# Patient Record
Sex: Male | Born: 1945 | Race: White | Hispanic: No | Marital: Married | Smoking: Never smoker
Health system: Southern US, Community
[De-identification: ages and names within clinical notes are randomized; demographics above are authoritative.]

## PROBLEM LIST (undated history)

## (undated) DIAGNOSIS — Z9889 Other specified postprocedural states: Secondary | ICD-10-CM

## (undated) DIAGNOSIS — Z8669 Personal history of other diseases of the nervous system and sense organs: Secondary | ICD-10-CM

## (undated) DIAGNOSIS — Z87442 Personal history of urinary calculi: Secondary | ICD-10-CM

## (undated) DIAGNOSIS — L719 Rosacea, unspecified: Secondary | ICD-10-CM

## (undated) DIAGNOSIS — E0590 Thyrotoxicosis, unspecified without thyrotoxic crisis or storm: Secondary | ICD-10-CM

## (undated) DIAGNOSIS — K219 Gastro-esophageal reflux disease without esophagitis: Secondary | ICD-10-CM

## (undated) DIAGNOSIS — I4891 Unspecified atrial fibrillation: Secondary | ICD-10-CM

## (undated) DIAGNOSIS — R112 Nausea with vomiting, unspecified: Secondary | ICD-10-CM

## (undated) HISTORY — DX: Unspecified atrial fibrillation: I48.91

## (undated) HISTORY — PX: VASECTOMY REVERSAL: SHX243

## (undated) HISTORY — PX: KNEE SURGERY: SHX244

## (undated) HISTORY — PX: CARDIOVERSION: SHX1299

## (undated) HISTORY — PX: OTHER SURGICAL HISTORY: SHX169

## (undated) HISTORY — PX: ELBOW SURGERY: SHX618

---

## 1999-04-22 ENCOUNTER — Emergency Department (HOSPITAL_COMMUNITY): Admission: EM | Admit: 1999-04-22 | Discharge: 1999-04-22 | Payer: Self-pay | Admitting: Emergency Medicine

## 1999-08-02 ENCOUNTER — Ambulatory Visit (HOSPITAL_COMMUNITY): Admission: RE | Admit: 1999-08-02 | Discharge: 1999-08-02 | Payer: Self-pay | Admitting: Gastroenterology

## 1999-10-16 ENCOUNTER — Encounter: Payer: Self-pay | Admitting: Specialist

## 1999-10-16 ENCOUNTER — Ambulatory Visit (HOSPITAL_COMMUNITY): Admission: RE | Admit: 1999-10-16 | Discharge: 1999-10-16 | Payer: Self-pay | Admitting: Specialist

## 2005-04-29 ENCOUNTER — Ambulatory Visit (HOSPITAL_COMMUNITY): Admission: RE | Admit: 2005-04-29 | Discharge: 2005-04-29 | Payer: Self-pay | Admitting: Gastroenterology

## 2006-03-11 ENCOUNTER — Ambulatory Visit: Payer: Self-pay | Admitting: Cardiology

## 2006-04-22 ENCOUNTER — Ambulatory Visit: Payer: Self-pay | Admitting: Cardiology

## 2006-06-02 ENCOUNTER — Ambulatory Visit: Payer: Self-pay | Admitting: Cardiology

## 2006-07-20 ENCOUNTER — Emergency Department (HOSPITAL_COMMUNITY): Admission: EM | Admit: 2006-07-20 | Discharge: 2006-07-20 | Payer: Self-pay | Admitting: Emergency Medicine

## 2006-10-06 ENCOUNTER — Ambulatory Visit: Payer: Self-pay | Admitting: Cardiology

## 2006-12-05 ENCOUNTER — Ambulatory Visit: Payer: Self-pay | Admitting: Cardiology

## 2006-12-05 LAB — CONVERTED CEMR LAB
ALT: 25 units/L (ref 0–40)
AST: 20 units/L (ref 0–37)
Albumin: 3.8 g/dL (ref 3.5–5.2)
Alkaline Phosphatase: 57 units/L (ref 39–117)
Bilirubin, Direct: 0.2 mg/dL (ref 0.0–0.3)
Cholesterol: 154 mg/dL (ref 0–200)
HDL: 33.3 mg/dL — ABNORMAL LOW (ref >39.0)
LDL Cholesterol: 109 mg/dL — ABNORMAL HIGH (ref 0–99)
Total Bilirubin: 1 mg/dL (ref 0.3–1.2)
Total CHOL/HDL Ratio: 4.6
Total Protein: 6.5 g/dL (ref 6.0–8.3)
Triglycerides: 57 mg/dL (ref 0–149)
VLDL: 11 mg/dL (ref 0–40)

## 2007-04-09 ENCOUNTER — Ambulatory Visit: Payer: Self-pay | Admitting: Cardiology

## 2007-04-09 LAB — CONVERTED CEMR LAB
Free T4: 0.9 ng/dL (ref 0.6–1.6)
TSH: 0.1 microintl units/mL — ABNORMAL LOW (ref 0.35–5.50)

## 2007-04-13 ENCOUNTER — Ambulatory Visit: Payer: Self-pay | Admitting: Cardiology

## 2007-04-23 ENCOUNTER — Ambulatory Visit: Payer: Self-pay | Admitting: Internal Medicine

## 2007-04-23 LAB — CONVERTED CEMR LAB: TSH: 0.18 microintl units/mL — ABNORMAL LOW (ref 0.35–5.50)

## 2007-04-28 ENCOUNTER — Ambulatory Visit: Payer: Self-pay | Admitting: Cardiology

## 2007-05-05 ENCOUNTER — Ambulatory Visit: Payer: Self-pay | Admitting: Cardiology

## 2007-05-12 ENCOUNTER — Ambulatory Visit: Payer: Self-pay | Admitting: Cardiology

## 2007-05-18 ENCOUNTER — Ambulatory Visit: Payer: Self-pay | Admitting: Cardiology

## 2007-05-28 ENCOUNTER — Ambulatory Visit: Payer: Self-pay | Admitting: Cardiology

## 2007-06-18 ENCOUNTER — Ambulatory Visit: Payer: Self-pay | Admitting: Cardiology

## 2007-06-25 ENCOUNTER — Ambulatory Visit: Payer: Self-pay | Admitting: Cardiology

## 2007-07-20 ENCOUNTER — Ambulatory Visit: Payer: Self-pay | Admitting: Cardiology

## 2007-07-24 ENCOUNTER — Ambulatory Visit: Payer: Self-pay | Admitting: Cardiology

## 2007-07-31 ENCOUNTER — Ambulatory Visit: Payer: Self-pay | Admitting: Cardiology

## 2007-08-07 ENCOUNTER — Ambulatory Visit: Payer: Self-pay | Admitting: Cardiology

## 2007-08-17 ENCOUNTER — Ambulatory Visit: Payer: Self-pay | Admitting: Cardiology

## 2007-08-25 ENCOUNTER — Ambulatory Visit: Payer: Self-pay | Admitting: Cardiology

## 2007-08-27 ENCOUNTER — Encounter (HOSPITAL_COMMUNITY): Admission: RE | Admit: 2007-08-27 | Discharge: 2007-09-21 | Payer: Self-pay | Admitting: Endocrinology

## 2007-08-31 ENCOUNTER — Ambulatory Visit: Payer: Self-pay | Admitting: Internal Medicine

## 2007-09-07 ENCOUNTER — Ambulatory Visit: Payer: Self-pay | Admitting: Internal Medicine

## 2007-09-15 ENCOUNTER — Ambulatory Visit: Payer: Self-pay | Admitting: Cardiology

## 2007-09-25 ENCOUNTER — Ambulatory Visit: Payer: Self-pay | Admitting: Cardiology

## 2007-10-02 ENCOUNTER — Ambulatory Visit: Payer: Self-pay | Admitting: Cardiology

## 2007-10-09 ENCOUNTER — Ambulatory Visit: Payer: Self-pay | Admitting: Cardiology

## 2007-10-15 ENCOUNTER — Ambulatory Visit (HOSPITAL_COMMUNITY): Admission: RE | Admit: 2007-10-15 | Discharge: 2007-10-15 | Payer: Self-pay | Admitting: Cardiology

## 2007-11-09 ENCOUNTER — Ambulatory Visit: Payer: Self-pay | Admitting: Internal Medicine

## 2007-11-09 ENCOUNTER — Ambulatory Visit: Payer: Self-pay | Admitting: Cardiology

## 2007-11-30 ENCOUNTER — Ambulatory Visit: Payer: Self-pay | Admitting: Cardiology

## 2007-12-03 ENCOUNTER — Ambulatory Visit: Payer: Self-pay | Admitting: Internal Medicine

## 2007-12-21 ENCOUNTER — Ambulatory Visit: Payer: Self-pay | Admitting: Cardiology

## 2008-01-07 ENCOUNTER — Ambulatory Visit: Payer: Self-pay | Admitting: Cardiology

## 2008-01-12 ENCOUNTER — Ambulatory Visit: Payer: Self-pay | Admitting: Cardiology

## 2008-02-04 ENCOUNTER — Ambulatory Visit: Payer: Self-pay | Admitting: Internal Medicine

## 2008-02-17 ENCOUNTER — Ambulatory Visit: Payer: Self-pay | Admitting: Cardiology

## 2008-03-03 ENCOUNTER — Ambulatory Visit: Payer: Self-pay | Admitting: Cardiology

## 2008-03-24 ENCOUNTER — Ambulatory Visit: Payer: Self-pay | Admitting: Cardiology

## 2008-04-21 ENCOUNTER — Ambulatory Visit: Payer: Self-pay | Admitting: Internal Medicine

## 2008-05-05 ENCOUNTER — Ambulatory Visit: Payer: Self-pay | Admitting: Internal Medicine

## 2008-05-24 ENCOUNTER — Ambulatory Visit: Payer: Self-pay | Admitting: Cardiology

## 2008-05-26 ENCOUNTER — Ambulatory Visit: Payer: Self-pay | Admitting: Cardiology

## 2008-06-24 ENCOUNTER — Ambulatory Visit: Payer: Self-pay | Admitting: Cardiology

## 2008-07-15 ENCOUNTER — Ambulatory Visit: Payer: Self-pay | Admitting: Internal Medicine

## 2008-08-12 ENCOUNTER — Ambulatory Visit: Payer: Self-pay | Admitting: Cardiology

## 2008-09-09 ENCOUNTER — Ambulatory Visit: Payer: Self-pay | Admitting: Cardiovascular Disease

## 2008-10-10 ENCOUNTER — Ambulatory Visit: Payer: Self-pay | Admitting: Cardiology

## 2008-11-03 ENCOUNTER — Ambulatory Visit: Payer: Self-pay | Admitting: Cardiovascular Disease

## 2008-12-01 ENCOUNTER — Ambulatory Visit: Payer: Self-pay | Admitting: Cardiology

## 2008-12-01 ENCOUNTER — Ambulatory Visit: Payer: Self-pay | Admitting: Cardiovascular Disease

## 2008-12-29 ENCOUNTER — Ambulatory Visit: Payer: Self-pay | Admitting: Cardiology

## 2009-01-21 DIAGNOSIS — R5381 Other malaise: Secondary | ICD-10-CM

## 2009-01-21 DIAGNOSIS — E049 Nontoxic goiter, unspecified: Secondary | ICD-10-CM | POA: Insufficient documentation

## 2009-01-21 DIAGNOSIS — R5383 Other fatigue: Secondary | ICD-10-CM

## 2009-01-21 DIAGNOSIS — R079 Chest pain, unspecified: Secondary | ICD-10-CM

## 2009-01-21 DIAGNOSIS — I4891 Unspecified atrial fibrillation: Secondary | ICD-10-CM

## 2009-01-26 ENCOUNTER — Ambulatory Visit: Payer: Self-pay | Admitting: Cardiology

## 2009-02-21 ENCOUNTER — Encounter: Payer: Self-pay | Admitting: *Deleted

## 2009-02-23 ENCOUNTER — Ambulatory Visit: Payer: Self-pay | Admitting: Cardiology

## 2009-02-23 LAB — CONVERTED CEMR LAB: POC INR: 2.6

## 2009-03-23 ENCOUNTER — Ambulatory Visit: Payer: Self-pay | Admitting: Internal Medicine

## 2009-03-23 LAB — CONVERTED CEMR LAB
POC INR: 2.7
Prothrombin Time: 19.9 s

## 2009-03-29 ENCOUNTER — Encounter: Payer: Self-pay | Admitting: *Deleted

## 2009-04-20 ENCOUNTER — Ambulatory Visit: Payer: Self-pay | Admitting: Internal Medicine

## 2009-04-20 LAB — CONVERTED CEMR LAB
POC INR: 2.4
Prothrombin Time: 18.7 s

## 2009-05-18 ENCOUNTER — Ambulatory Visit: Payer: Self-pay | Admitting: Internal Medicine

## 2009-06-15 ENCOUNTER — Ambulatory Visit: Payer: Self-pay | Admitting: Cardiology

## 2009-07-13 ENCOUNTER — Ambulatory Visit: Payer: Self-pay | Admitting: Cardiology

## 2009-07-13 ENCOUNTER — Encounter: Payer: Self-pay | Admitting: Cardiovascular Disease

## 2009-07-27 ENCOUNTER — Encounter: Payer: Self-pay | Admitting: Cardiology

## 2010-01-16 ENCOUNTER — Encounter: Payer: Self-pay | Admitting: Cardiology

## 2010-07-11 ENCOUNTER — Ambulatory Visit: Payer: Self-pay | Admitting: Cardiology

## 2010-08-14 ENCOUNTER — Encounter: Payer: Self-pay | Admitting: Cardiology

## 2010-08-29 ENCOUNTER — Telehealth (INDEPENDENT_AMBULATORY_CARE_PROVIDER_SITE_OTHER): Payer: Self-pay | Admitting: *Deleted

## 2010-09-12 ENCOUNTER — Ambulatory Visit (HOSPITAL_COMMUNITY)
Admission: RE | Admit: 2010-09-12 | Discharge: 2010-09-12 | Payer: Self-pay | Source: Home / Self Care | Attending: General Surgery | Admitting: General Surgery

## 2010-10-23 NOTE — Assessment & Plan Note (Signed)
Summary: 6 month rov   Visit Type:  6 months follow up Primary Provider:  [facility]  CC:  No complaints.  History of Present Illness: Feels good.  Plays tennis without difficulty. Never feels atrial fibrillation.  There are rare times when he takes a second before he moves.  But no definite symptoms.  Colonoscopy BP was higher.  Dr.  Evlyn Kanner took him off medications.    Current Medications (verified): 1)  Aspirin Ec 325 Mg Tbec (Aspirin) .... Take One Tablet By Mouth Daily 2)  Metoprolol Succinate 50 Mg Xr24h-Tab (Metoprolol Succinate) .... Take One Tablet By Mouth Daily; Pt Not Sure of Dose 3)  Diltiazem Hcl Er Beads 120 Mg Xr24h-Cap (Diltiazem Hcl Er Beads) .... Take One Capsule By Mouth Daily 4)  Doxycycline Hyclate 100 Mg Caps (Doxycycline Hyclate) .... Take 1 Capsule By Mouth Once A Day  Allergies: 1)  ! Ampicillin  Vital Signs:  Patient profile:   65 year old male Height:      73 inches Weight:      200.75 pounds BMI:     26.58 Pulse rate:   61 / minute Pulse rhythm:   irregular Resp:     18 per minute BP sitting:   100 / 60  (right arm) Cuff size:   large  Vitals Entered By: Vikki Ports (July 11, 2010 10:36 AM)  Physical Exam  General:  Well developed, well nourished, in no acute distress. Head:  normocephalic and atraumatic Eyes:  PERRLA/EOM intact; conjunctiva and lids normal. Lungs:  Clear bilaterally to auscultation and percussion. Heart:  irregular, irregular. Pulses:  pulses normal in all 4 extremities Extremities:  No clubbing or cyanosis. Neurologic:  Alert and oriented x 3.   Impression & Recommendations:  Problem # 1:  ATRIAL FIBRILLATION (ICD-427.31)  Rate contolled adequately.  Will continue meds.  No change at present.  Chads score 0, Chads Vasc2 1  Orders: EKG w/ Interpretation (93000)  Problem # 2:  GOITER (ICD-240.9) Followed by Dr. Evlyn Kanner.  Patient Instructions: 1)  Your physician recommends that you continue on  your current medications as directed. Please refer to the Current Medication list given to you today. 2)  Your physician wants you to follow-up in: 6 MONTHS.  You will receive a reminder letter in the mail two months in advance. If you don't receive a letter, please call our office to schedule the follow-up appointment.

## 2010-10-23 NOTE — Progress Notes (Signed)
Summary: Records Request  Faxed OV & EKG to [facility] ([phone]). Debby Freiberg  August 29, 2010 12:23 PM

## 2010-10-23 NOTE — Letter (Signed)
Summary: [facility] Progress Note  [facility] Progress Note   Imported By: Roderic Ovens 03/14/2010 15:08:03  _____________________________________________________________________  External Attachment:    Type:   Image     Comment:   External Document

## 2010-11-14 NOTE — Letter (Signed)
Summary: [facility] - Office Note  [facility] - Office Note   Imported By: Marylou Mccoy 11/09/2010 09:47:25  _____________________________________________________________________  External Attachment:    Type:   Image     Comment:   External Document

## 2010-12-03 LAB — COMPREHENSIVE METABOLIC PANEL
AST: 26 U/L (ref 0–37)
Albumin: 3.8 g/dL (ref 3.5–5.2)
Alkaline Phosphatase: 57 U/L (ref 39–117)
Chloride: 106 mEq/L (ref 96–112)
GFR calc Af Amer: >60 mL/min (ref >60)
Potassium: 4.5 mEq/L (ref 3.5–5.1)
Sodium: 142 mEq/L (ref 135–145)
Total Bilirubin: 1.1 mg/dL (ref 0.3–1.2)

## 2010-12-03 LAB — DIFFERENTIAL
Basophils Absolute: 0 10*3/uL (ref 0.0–0.1)
Basophils Relative: 1 % (ref 0–1)
Eosinophils Relative: 6 % — ABNORMAL HIGH (ref 0–5)
Monocytes Absolute: 0.6 10*3/uL (ref 0.1–1.0)

## 2010-12-03 LAB — URINALYSIS, ROUTINE W REFLEX MICROSCOPIC
Hgb urine dipstick: NEGATIVE
Nitrite: NEGATIVE
Specific Gravity, Urine: 1.022 (ref 1.005–1.030)
Urobilinogen, UA: 0.2 mg/dL (ref 0.0–1.0)

## 2010-12-03 LAB — CBC
Platelets: 178 10*3/uL (ref 150–400)
RBC: 5.06 MIL/uL (ref 4.22–5.81)
WBC: 5.9 10*3/uL (ref 4.0–10.5)

## 2010-12-03 LAB — SURGICAL PCR SCREEN: Staphylococcus aureus: NEGATIVE

## 2011-01-22 ENCOUNTER — Encounter: Payer: Self-pay | Admitting: Cardiology

## 2011-01-23 ENCOUNTER — Encounter: Payer: Self-pay | Admitting: Cardiology

## 2011-01-23 ENCOUNTER — Ambulatory Visit (INDEPENDENT_AMBULATORY_CARE_PROVIDER_SITE_OTHER): Payer: 59 | Admitting: Cardiology

## 2011-01-23 DIAGNOSIS — D489 Neoplasm of uncertain behavior, unspecified: Secondary | ICD-10-CM

## 2011-01-23 DIAGNOSIS — E0590 Thyrotoxicosis, unspecified without thyrotoxic crisis or storm: Secondary | ICD-10-CM

## 2011-01-23 DIAGNOSIS — I4891 Unspecified atrial fibrillation: Secondary | ICD-10-CM

## 2011-01-23 NOTE — Assessment & Plan Note (Signed)
Followed by Dr South.   

## 2011-01-23 NOTE — Progress Notes (Signed)
HPI:  Kyle [name] is in for a follow up office visit.  He is doing pretty well overall.  He denies ongoing problems.  He tolerates his atrial fibrillation.  Since I last saw him he had surgery with Claud Kelp.   The operation is as follows: Dec 2011 PREOPERATIVE DIAGNOSIS:  Villous adenoma of the rectum, left lateral   wall.      POSTOPERATIVE DIAGNOSIS:  Villous adenoma of the rectum, left lateral   wall.      OPERATION PERFORMED:  Transanal excision of villous adenoma of the   rectum.      SURGEON:  Angelia Mould. Derrell Lolling, M.D.          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  He denies chest pain, syncope or presyncope, or other major symptoms.  Getting along well overall.    Current Outpatient Prescriptions  Medication Sig Dispense Refill  . aspirin 325 MG tablet Take 325 mg by mouth daily.        Marland Kitchen diltiazem (DILACOR XR) 120 MG 24 hr capsule Take 120 mg by mouth daily.        Marland Kitchen doxycycline (VIBRAMYCIN) 100 MG capsule Take 100 mg by mouth daily.        Marland Kitchen DISCONTD: metoprolol (TOPROL-XL) 50 MG 24 hr tablet Take 50 mg by mouth daily. Pt not sure of dose        Allergies  Allergen Reactions  . Ampicillin     REACTION: Rash    Past Medical History  Diagnosis Date  . Chest pain   . Fatigue   . Atrial fibrillation   . Goiter     Past Surgical History  Procedure Date  . Elbow surgery     No family history on file.  History   Social History  . Marital Status: Married    Spouse Name: N/A    Number of Children: 4  . Years of Education: N/A   Occupational History  . retirement Programmer, systems    Social History Main Topics  . Smoking status: Never Smoker   . Smokeless tobacco: Not on file  . Alcohol Use: Yes     occasional  . Drug Use: No  . Sexually Active: Not on file   Other Topics Concern  . 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 He does  not use cigarettes or recreational drugs. He does use alcohol occasionally.    ROS: Please see the HPI.  All other systems reviewed and negative.  PHYSICAL EXAM:  BP 126/86  Pulse 66  Ht 6\' 1"  (1.854 m)  Wt 201 lb 1.9 oz (91.227 kg)  BMI 26.53 kg/m2  General: Well developed, well nourished, in no acute distress. Head:  Normocephalic and atraumatic. Neck: no JVD Lungs: Clear to auscultation and percussion. Heart: Normal S1 and S2.  Irregularly, irregular rhythm without a significant murmur noted.    Pulses: Pulses normal in all 4 extremities. Extremities: No clubbing or cyanosis. No edema. Neurologic: Alert and oriented x 3.  EKG:  Atrial fibrillation with controlled ventricular response.    ASSESSMENT AND PLAN:

## 2011-01-23 NOTE — Assessment & Plan Note (Signed)
On limited dose of diltiazem.  He is stable, and is on regular dose aspirin based on his CHADS Vasc2 Score.

## 2011-01-25 ENCOUNTER — Encounter (INDEPENDENT_AMBULATORY_CARE_PROVIDER_SITE_OTHER): Payer: Self-pay | Admitting: General Surgery

## 2011-02-05 NOTE — Assessment & Plan Note (Signed)
[facility]                            CARDIOLOGY OFFICE NOTE   NAME:Kyle [name], Kyle [name]                         MRN:          [id number]  DATE:08/07/2007                            DOB:          Jun 11, 1946    Level I visit.   Kyle [name] came in today.  We were setting him up for a cardioversion.  His  INR has continued to remain stable.  His last INR in early November was  well above 2.  He is going back to the lab today.  He saw Dr. Evlyn [name] on  Monday, and apparently Dr. Evlyn [name] told him that he would contact me.  We  have not heard from Dr. Evlyn [name], so we will make contact with their  office.  I do think that cardioversion would be in his best interest,  unless he continues to be significantly hyperthyroid.   PHYSICAL EXAMINATION:  VITAL SIGNS:  Today the blood pressure is 110/74,  pulse is 74.  CARDIAC:  Rhythm is irregularly irregular.   He will go to the lab to get his INR today.   We will plan to go ahead with the cardioversion as soon as we get the go  ahead from Dr. Evlyn [name].     Kyle [name]. Kyle Kill, MD, [facility]  Electronically Signed    TDS/MedQ  DD: 08/07/2007  DT: 08/07/2007  Job #: [id number]

## 2011-02-05 NOTE — Assessment & Plan Note (Signed)
[facility]                            CARDIOLOGY OFFICE NOTE   NAME:Draughn, ZAKARI BATHE                         MRN:          [id number]  DATE:12/01/2008                            DOB:          12-22-1945    [name] is in for a followup visit.  He is stable.  He has been  maintaining a busy [employer] schedule, without any difficulty whatsoever.  He  has seen Dr. Evlyn Kanner recently, and has been told that there was a  potential that he might be able to come off of his medications for  hyperthyroidism.  The patient has a low CHADS score, but we have  maintained him on Coumadin anticoagulation after discussion with the  electrophysiologists in light of his hyperthyroidism.   He denies any chest pain.   Current medications include Cardizem CD 120 mg daily, Coumadin as  directed, aspirin 81 mg daily, metoprolol 25 mg p.o. b.i.d., and  methimazole 10 mg one-half daily.   On physical examination today, the blood pressure was initially recorded  at 90/70 with a pulse of 67 that was irregularly irregular, when I took  his blood pressure, the blood pressure was actually 120/70.  The lung  fields were clear.  The cardiac rhythm was irregularly irregular.  The  extremities revealed no significant edema.   The electrocardiogram demonstrates atrial fibrillation with controlled  ventricular response.   IMPRESSION:  1. Persistent atrial fibrillation.  2. Subclinical hyperthyroidism.  3. Low CHADS score, but with hyperthyroidism.   DISPOSITION:  1. To reduce bleeding risk, we will discontinue his aspirin.  2. We will continue him on Coumadin anticoagulation for the present      time.  3. We had a long discussion today about the role of advanced      techniques, but at the present time the patient is stable.   PLAN:  1. Return to clinic in 6 months.  2. Continue current medical regimen except discontinuation of aspirin.  3. Continue follow up with Dr. Adrian Prince.   ADDENDUM:  EKG reveals atrial fibrillation with controlled ventricular  response.  Otherwise unremarkable.     [name]. Riley Kill, MD, [facility]  Electronically Signed    TDS/MedQ  DD: 12/02/2008  DT: 12/02/2008  Job #: [id number]   cc:   Jeannett Senior A. Evlyn Kanner, M.D.

## 2011-02-05 NOTE — Assessment & Plan Note (Signed)
[facility]                            CARDIOLOGY OFFICE NOTE   NAME:Ikeda, Kyle [name]                         MRN:          [id number]  DATE:04/09/2007                            DOB:          [birth year]    Mr. Rooke is in for followup, he has recently seen Dr. Doristine Counter.  He  notices some palpitations but does not feel tremendously abnormal today.  Of note, the patient is now in atrial fibrillation.  This is new and has  never been documented, although he says symptomatically it is similar to  what he has thought he had.  His ventricular rate today is 101 without  any acute EKG changes.  He denies any chest pain.  He denies any  significant weight loss or weight gain over the past few months.   Today the blood pressure is 104/70, the pulse is 101 and irregularly  irregular.  LUNGS:  Fields are clear.  There is trace edema.   I spent nearly a 1/2 an hour the findings associated with atrial  fibrillation.  We discussed the risks.  He does not have any of the  traditional risks for stroke.  He is approaching an older age group, but  at the present time we will recommend a simple aspirin a day.  I will  start him on Cardizem CD 120 to see if we can get better rate control  and we will have him return to the clinic on Monday.  His previous  echocardiogram did not define underlying structural heart disease and he  has no known coronary artery disease and he has no known coronary artery  disease.  We will schedule him to see our electrophysiologist to discuss  various options after I see him back and make sure his rate is under  control.     [name]. Riley Kill, MD, [facility]  Electronically Signed    TDS/MedQ  DD: 04/09/2007  DT: 04/10/2007  Job #: [id number]   cc:   Marjory Lies, M.D.

## 2011-02-05 NOTE — Assessment & Plan Note (Signed)
[facility]                         ELECTROPHYSIOLOGY OFFICE NOTE   NAME:Kyle [name], Kyle [name]                         MRN:          [id number]  DATE:12/03/2007                            DOB:          Oct 05, 1945    The patient is seen following attempt at cardioversion by Maisie Fus D.  Riley Kill, MD, [facility].  He has atrial fibrillation of which he is largely  unaware.  He did have some rapid rates back in the early summer when it  was first identified and he was put on Cardizem and beta blockers for  this.  His major complaint is that he is aware that he is more tired now  than he was a year ago, particularly with exercise.  There is no other  specific symptoms to contribute to his medications or his atrial  fibrillation.   Attempted cardioversion as noted was unsuccessful with early recurrence  of atrial fibrillation.  His thyroid condition after I spoke with Dr.  Evlyn Kanner is apparently an autonomous goiter that is not all that hot and  hence his thyroid hormones are relatively normal.   PHYSICAL EXAMINATION:  VITAL SIGNS:  Today his blood pressure was  102/60, pulse 72 that was irregular.  LUNGS:  Clear.  HEART:  Sounds were irregular.  EXTREMITIES:  Without edema.   IMPRESSION:  1. Atrial fibrillation - persistent.  2. Autonomous goiter.  3. Fatigue, question related to atrial fibrillation versus beta      blocker therapy.   We had a lengthy discussion of greater than 30-40 minutes regarding  treatment options and the thyroid status.  We discussed poor arrhythmia  with __________  drugs.  We discussed the potential contribution to his  fatigue of his beta blockers versus the atrial fibrillation, and we  discussed his thromboembolic risks on or off Coumadin.   For right now the plan will be to:  1. Discontinue his metoprolol and see how he does for the next 2-3      weeks and see if his fatigue is somewhat ameliorated.  In the event      that the answer is  yes, we will then submit him for treadmill      testing to see whether his heart rate is adequately controlled.  2. We will maintain him for the time being on Coumadin as in the event      that we find that his fatigue persists despite the elimination of      beta blocker and without the emergence of inadequate rate control,      the possibility of drug therapy for the restoration of the sinus      rhythm to see if we can address the fatigue.   He will follow up with Dr. Evlyn Kanner in a couple of months.     Duke Salvia, MD, [facility]  Electronically Signed    SCK/MedQ  DD: 12/03/2007  DT: 12/03/2007  Job #: [id number]   cc:   Jeannett Senior A. Evlyn Kanner, M.D.

## 2011-02-05 NOTE — Assessment & Plan Note (Signed)
[facility]                            CARDIOLOGY OFFICE NOTE   NAME:Kyle [name], Kyle [name]                         MRN:          [id number]  DATE:02/17/2008                            DOB:          Jun 12, 1946    Kyle [name] is in for followup.  Kyle [name] really feels as good as Kyle [name] has felt in a  long time.  Kyle [name] is able to do many activities including tennis and golf.  Kyle [name] denies any critical symptoms.  We talked about the fact that Kyle [name]  remains in atrial fibrillation, having failed cardioversion.  Kyle [name] is now  on antithyroid medication and feels well.  Kyle [name] is also on rate-  controlling medicine.  We spent quite a bit of time going through the  mechanism of atrial fibrillation and potential long term treatment  options.   Currently, medications include Cardizem CD 120 mg daily, Coumadin as  directed, aspirin 81 mg daily, metoprolol 25 mg p.o. b.i.d., and  methimazole 10 mg 1-1/2 tablet daily.   PHYSICAL EXAMINATION:  GENERAL:  Kyle [name] is alert and oriented and in no  distress.  VITAL SIGNS:  Weight is 203 pounds.  LUNGS:  Lung fields are clear.  HEART:  Cardiac rhythm is irregularly irregular, but without a  significant murmur noted.  ABDOMEN:  Soft.   Overall, Kyle [name] remains stable at the present time.  Kyle [name] has a [place] score  of 0, although with his hyperthyroidism, we have been more inclined to  leave him on Coumadin anticoagulation.  It is thought that Kyle [name] has an  autonomous goiter.  We will continue to see him in continuing followup  in 3 months.  We will continue to maintain a dialogue about the  possibility of other treatment options for his atrial fibrillation.      [name]. Riley Kill, MD, [facility]  Electronically Signed    TDS/MedQ  DD: 02/18/2008  DT: 02/18/2008  Job #: [id number]

## 2011-02-05 NOTE — Letter (Signed)
May 24, 2008    Marjory Lies, MD  [address]  [place], [place] [address]   RE:  Kyle [name], Kyle [name]  MRN:  [id number]  /  DOB:  01-Nov-1945   Dear Kipp Brood,   I had the pleasure of seeing Kyle [name] in the office today in followup.  Kyle [name] has persistent atrial fibrillation with controlled ventricular  response.  As you know, he has had a thyroid abnormality, been followed  by Dr. Evlyn Kanner, and has had medical treatment for this.  He failed an  attempt to cardioversion.  He has remained active, having moved his  daughter over the weekend, doing a Hydrographic surveyor trip, and he has also  played vigorous tennis for nearly 10 days in a row.   He remains on warfarin anticoagulation.   MEDICATIONS:  1. Cardizem CD 120 mg daily.  2. Coumadin as directed.  3. Aspirin 81 mg daily.  4. Metoprolol 25 mg p.o. b.i.d.  5. Methimazole 10 mg one half daily.   Apparently, he saw Dr. Evlyn Kanner last week, although we are not aware of the  results of his laboratory studies.   PHYSICAL EXAMINATION:  VITAL SIGNS:  The blood pressure is 106/62.  The  pulse is 64.  His weight is slightly up at 208 pounds.  GENERAL:  He appears well.  LUNGS:  Fields are clear to auscultation and percussion.  CARDIAC:  Rhythm is irregularly irregular without a murmur.   Electrocardiogram reveals atrial fibrillation with controlled  ventricular response, and absolutely no other changes.   Overall, his exercise tolerance appears to be relatively good.  He is  able to do most anything.  He walked without difficulty.  At the present  time, we will continue to recommend medical therapy.  He may eventually  be, a candidate for atrial fibrillation ablation, but certainly that  would be not recommended at the present time, given his functional  status and overall situation.  We will continue to keep an eye on the  developing findings.  I appreciate the opportunity sharing in his care.    Sincerely,      Kyle Morton. Riley Kill, MD, [facility]  Electronically Signed    TDS/MedQ  DD: 05/24/2008  DT: 05/25/2008  Job #: [id number]   CC:    Tera Mater. Evlyn Kanner, M.D.

## 2011-02-05 NOTE — Assessment & Plan Note (Signed)
[facility]                                 ON-CALL NOTE   NAME:Kyle [name], Kyle [name]                         MRN:          [id number]  DATE:08/14/2007                            DOB:          08/12/1946    I spoke with Mr. Cothran by phone. I had spoken with Dr. Evlyn Kanner earlier in  the week. His TSH was somewhat more suppressed, so Dr. Evlyn Kanner felt that  we should defer cardioversion at this time. He plans to set the patient  up for a thyroid scan and the patient confirmed this over the phone  today. He was called by one of Dr. Rinaldo Cloud staff members. Apparently,  there is some suggestion that he might have a functioning nodule, and  therefore, a decision will be made with regard to some therapy. Dr.  Evlyn Kanner thought it would be better that he be under better thyroid control  before we consider cardioversion. This was discussed with the patient.     [name]. Riley Kill, MD, [facility]  Electronically Signed    TDS/MedQ  DD: 08/14/2007  DT: 08/15/2007  Job #: [id number]

## 2011-02-05 NOTE — Assessment & Plan Note (Signed)
[facility]                            CARDIOLOGY OFFICE NOTE   NAME:Kyle [name], Kyle [name]                         MRN:          [id number]  DATE:05/18/2007                            DOB:          1946/05/21    [name] is in for followup. He really is doing quite well. He has not  limitations or shortness of breath. He denies any limitation of what he  can do. He is in atrial fibrillation. I had him see Dr. Berton Mount in  the electrophysiology unit, and Dr. Graciela Husbands recommended that he have  further evaluation. In general, the patient has been otherwise stable.  Dr. Graciela Husbands had spoken with Dr. Ardyth Harps, they repeated his TSH and his  TSH remains low on 3 separate occasions despite having free T4, free T3  and T3 by radioimmunoassay that all normal. He has no hyperthyroid  symptoms.   Today on his examination, the blood pressure is 100/67, and the pulse is  80 and irregularly irregular.   I have spoken with Dr. Evlyn Kanner about his situation. His EKG today reveals  a controlled ventricular response. He is well controlled on medications  and his INR is adequate for cardioversion. However, the issue of  potential hyperthyroidism remains. I spoke with Dr. Evlyn Kanner who will call  the patient and get him in for a full evaluation. Once we have this in  place, we will make preparations for cardioversion based on Dr. Odessa Fleming  recommendations. I will see him back in follow up in 4 weeks.     [name]. Riley Kill, MD, [facility]  Electronically Signed    TDS/MedQ  DD: 05/18/2007  DT: 05/18/2007  Job #: [id number]

## 2011-02-05 NOTE — Assessment & Plan Note (Signed)
[facility]                            CARDIOLOGY OFFICE NOTE   NAME:[name], Kyle MACDONNELL                         MRN:          [id number]  DATE:01/12/2008                            DOB:          [birth year]    Kyle [name] is in for a followup visit.  He is stable.  He has not been  having much in the way of chest pain or shortness of breath.  He is  actually feeling dramatically better ever since he was placed on  antithyroid medication.  He feels well.   PHYSICAL EXAMINATION:  VITAL SIGNS:  Blood pressure was 104/70, pulse 63  and irregularly irregular.  LUNGS:  Lung fields are clear.   EKG reveals atrial fibrillation with controlled ventricular response.   With exercise in the office, his heart rate goes to 100.   Overall, he may be on the right amount of medicine.  As his thyroid  comes under better control, he may not need as much.  We will see him  back in 6 weeks with the hope of adjusting his metoprolol back to maybe  12.5 twice a day along with low-dose Cardizem.  Hopefully, his ability  to convert will be better once his thyroid is under control.  He is  going to could continue with followup with Dr. Evlyn Kanner.  I will continue  to talk with Dr. Graciela Husbands about whether or not ablation has any role in his  care.  This would have to be predicated upon euthyroid status.     [name]. Riley Kill, MD, [facility]  Electronically Signed    TDS/MedQ  DD: 01/12/2008  DT: 01/12/2008  Job #: [id number]

## 2011-02-05 NOTE — Letter (Signed)
September 25, 2007    Marjory Lies, M.D.  [address]  [place], [place] [address]   RE:  Kyle [name], Kyle [name]  MRN:  [id number]  /  DOB:  Nov 24, 1945   Dear Kipp Brood,   I had the pleasure of seeing Kyle [name] in the office today in followup.  Many months ago, he developed atrial fibrillation.  He subsequently was  determined to be somewhat hyperthyroid and he has been seeing Ardyth Harps in consultation and recently had a thyroid scan.  He has not had  followup with that yet to determine really what the extent of the  problem is.  We have had him also hold his tetracycline, which he takes  for rosacea, and is managed by Dr. Terri Piedra.  Importantly, I have asked  him to follow up with Dr. Terri Piedra again, as he has been off the  tetracycline and has had some breaking out on his skin, compatible with  his rosacea.   From a cardiac standpoint, he continues to do well, without significant  symptomatology.   His blood pressure today was 102/64 with a pulse of 69, irregularly  irregular.  The carotid upstrokes are brisk without bruit.  He does have  findings of a maculopapular rash on the face.  Cardiac rhythm is  irregularly irregular.   EKG reveals atrial fibrillation with controlled ventricular response.   This gentleman has done well from the standpoint of his atrial  fibrillation.  He is on Coumadin anticoagulation.  We have talked about  the possibility of doing a cardioversion, but that has been on hold  until we get the green light from Dr. Evlyn Kanner.  I will plan to see him  back shortly and we are going to make him an additional appointment to  see Dr. Evlyn Kanner again.   Thank you for allowing [place] to share in his care.    Sincerely,      [name]. Riley Kill, MD, [facility]  Electronically Signed    TDS/MedQ  DD: 09/25/2007  DT: 09/25/2007  Job #: [id number]   CC:   Elinor Parkinson. Worthy Rancher, M.D.  Tera Mater. Evlyn Kanner, M.D.

## 2011-02-05 NOTE — Assessment & Plan Note (Signed)
 HEALTHCARE                            CARDIOLOGY OFFICE NOTE   NAME:Steffenhagen, DEROLD DORSCH                         MRN:          [id number]  DATE:06/25/2007                            DOB:          [birth year]    [name] is in for followup.  In general, he has been stable.  He denies  any chest pain, overall he feels well.  He has been taking his Coumadin  without difficulty.  We have talked about the possibility of  cardioversion.  He has been seeing Dr. Evlyn Kanner on a regular basis and they  have been trying to evaluate his thyroid.  The status of this would  affect whether or not we proceed with cardioversion.   PHYSICAL:  The blood pressure is 110/64 and the pulse is 79, irregularly  irregular.  LUNG FIELDS:  Clear.  CARDIAC EXAM:  Unchanged.   EKG is unremarkable except for the atrial fibrillation.   IMPRESSION:  Recent onset atrial fibrillation with controlled  ventricular response.   PLAN:  We will consider cardioversion in the interim, I will speak with  Dr. Evlyn Kanner before we schedule this.     [name]. Riley Kill, MD, [facility]  Electronically Signed    TDS/MedQ  DD: 06/29/2007  DT: 06/29/2007  Job #: [id number]

## 2011-02-05 NOTE — Assessment & Plan Note (Signed)
[facility]                            CARDIOLOGY OFFICE NOTE   NAME:[name], Kyle GOLDNER                         MRN:          [id number]  DATE:04/13/2007                            DOB:          1946/07/13    [name] is in for followup visit.  I have started him on Cardizem, and his  rate is much better.  We have continued him on single-dose aspirin based  on his risk factors.  His TSH actually was low, but a previous TSH done  in the past year was completely normal.  His free T4 is low.  He has  only had a couple of pounds of weight loss, and he does not have any  specific symptoms.   Today, the blood pressure is 106/86, the pulse is 93 and irregularly  irregular.  It is perhaps slightly better than before.  He took his  medicines this morning.  He is tolerating all of this reasonably well.  His previous echocardiogram was unremarkable.   We plan to continue him on the current medicines.  I have gone over this  in great detail with him.  We have multiple options which would include  starting Coumadin and converting him back to normal rhythm, but he has  been in and out of this it sounds like for some time.  As a result, he  would probably require an anti-arrhythmic drug.  The alternative would  be to keep him in atrial fibrillation with controlled ventricular  response and on daily aspirin therapy.  I plan to have him see Dr. Berton Mount in a cardiac consultation for electrophysiologic purposes.  We  will review the findings once he sees him.     [name]. Riley Kill, MD, [facility]  Electronically Signed    TDS/MedQ  DD: 04/13/2007  DT: 04/13/2007  Job #: [id number]   cc:   Duke Salvia, MD, [facility]

## 2011-02-05 NOTE — Cardiovascular Report (Signed)
NAMEKATHAN, KIRKER NO.:  [id number]   MEDICAL RECORD NO.:  [id number]          PATIENT TYPE:  OIB   LOCATION:  2899                         FACILITY:  [facility]   PHYSICIAN:  [name]. Riley Kill, MD, FACCDATE OF BIRTH:  [birth year]   DATE OF PROCEDURE:  10/15/2007  DATE OF DISCHARGE:                            CARDIAC CATHETERIZATION   INDICATIONS:  Mr. Fero has a new-onset atrial fibrillation.  He has been  on Coumadin anticoagulation.  He has been borderline hyperthyroid and  has been seeing Dr. Evlyn Kanner in consultation.  He has subsequently improved  proceeding on for cardioversion.  As a result, the patient was brought  for cardioversion today.  The risks, benefits and alternatives have been  discussed in detail with Mr. Landgrebe.   PROCEDURE:  The procedure was performed in short stay.  The patient was  n.p.o. INR had been obtained and was 2.1. The patient was given  Pentothal by Dr. Sharee Holster. Initially, the patient received  biphasic defibrillation at 120 joules.  Repeat was done at 150 and then  subsequently another 150 joules by biphasic technique.  This was done in  a synchronized fashion.  Following cardioversion, the patient was  restored to normal sinus rhythm on two occasions, but it lasted only  briefly and he returned to atrial fibrillation with controlled  ventricular response.  As a result, no further attempts were initiated  to perform cardioversion.  The patient woke up and was disappointed.  He  will be discharged, 1% hydrocortisone cream was placed on his chest.      [name]. Riley Kill, MD, [facility]  Electronically Signed     TDS/MEDQ  D:  10/15/2007  T:  10/15/2007  Job:  [id number]

## 2011-02-05 NOTE — Assessment & Plan Note (Signed)
[facility]                            CARDIOLOGY OFFICE NOTE   NAME:Kyle [name], Kyle [name]                         MRN:          [id number]  DATE:11/09/2007                            DOB:          November 03, 1945    Kyle [name] is in for a follow-up.  To briefly summarize, he underwent  attempted cardioversion.  Although we were unable to successfully  cardiovert him back to normal rhythm despite a reasonably good attempt.  He would convert for 2 to 3 beats, and immediately go back into atrial  fib.  I have spoken with Dr. Evlyn Kanner in the past.  He thinks that he has  no autonomous multinodular goiter.  He has near normal circulating  thyroid hormone levels with a low TSH.  Generally, he has been stable,  however.  The patient denies any ongoing chest pain and works with the  [employer] and would like to do more.   CURRENT MEDICATIONS:  Toprol XL 50 mg daily, Cardizem CD 120 mg daily,  aspirin 81 mg daily, Coumadin as directed and doxycycline.   PHYSICAL EXAMINATION:  Blood pressure is 114/77 and pulse 70.  The lung fields are clear.  CARDIAC:  Rhythm is irregular irregular without significant murmur.   The patient remains fully functional.  He probably has low level  hyperthyroidism.  In this regard, I am reluctant to consider a course of  antiarrhythmic therapy in the setting of a subclinical hyperthyroidism  because of questions over efficacy.  The patient is perfectly  asymptomatic remaining in atrial fibrillation with controlled  ventricular response.  I will have him follow-up with Dr. Berton Mount,  and we will continue to discuss his case with regard to various options.  Kyle [name] is agreeable to this approach.     [name]. Riley Kill, MD, [facility]  Electronically Signed    TDS/MedQ  DD: 11/09/2007  DT: 11/09/2007  Job #: [id number]

## 2011-02-05 NOTE — Assessment & Plan Note (Signed)
[facility]                            CARDIOLOGY OFFICE NOTE   NAME:Castorena, SHLOMA ROGGENKAMP                         MRN:          [id number]  DATE:07/24/2007                            DOB:          [birth year]    Mr. Sanker is in for a followup visit.  He is really quite stable.  He has  been able to play tennis, golf, and do his work without any difficulty.  He remains well controlled on his medications.  He had 1 slightly low  INR, and this has been adjusted.  He is scheduled to see Dr. Evlyn Kanner in  the next week, and he is scheduled to see me back in about 2 weeks.  I  have reviewed Dr. Odessa Fleming note, and our plan, currently, is to convert  him back to normal sinus rhythm.  If he does not hold, then he may  require antiarrhythmic drug therapy which I will defer to Dr. Odessa Fleming  judgment.  The patient has a very low [place] score, and other than  cardioversion, he would have been on aspirin.   CURRENT MEDICATIONS:  1. Toprol XL 50 mg daily.  2. Cardizem CD 120 mg daily.  3. Aspirin 325 mg daily.  4. Coumadin as directed.   PHYSICAL EXAMINATION:  Blood pressure is 104/70, pulse is 82, the pulse  is irregularly irregular.   His EKG today reveals atrial fib with controlled ventricular response.   We plan to see him back in followup in about 2 weeks.  Should he have  problems in the interim, he is to let us know.  He will see Dr. Evlyn Kanner  for a final visit.  I have spoken with Dr. Evlyn Kanner by phone who generally  thinks it would be okay to go ahead with cardioversion.  We will plan  this within a couple of days after I see him next.  He is agreeable to  this plan.     [name]. Riley Kill, MD, [facility]  Electronically Signed    TDS/MedQ  DD: 07/24/2007  DT: 07/25/2007  Job #: [id number]

## 2011-02-05 NOTE — Letter (Signed)
April 23, 2007    [name]. Riley Kill, MD, [facility]  [address]  [address]  [place], [place] [address]   RE:  Kyle [name], Kyle [name]  MRN:  [id number]  /  DOB:  1945-11-18   Dear Elijah Birk,   It was a pleasure to see Meagan Spease today because of atrial  fibrillation.   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  Initially when he presented he had some  discomfort in his neck, which he described as pressure.  That has now  largely abated since you have put him on the Toprol.  He is also  concomitantly being treated with Cardizem.   His thromboembolic risk factors are broadly negative.   He has undergone standard treadmill testing that was negative and his  cardiac risk factors were also negative.   His echocardiogram was done and demonstrated normal left atrial size.   His other cardiovascular symptoms are largely negative.  There is no  edema, no nocturnal dyspnea and no presyncope.   Interestingly, his thyroid laboratories demonstrate a low TSH on initial  and repeat evaluations with normal T4 and T3, (see below).   PAST MEDICAL HISTORY:  His past medical history in addition to the above  is notable for:  1. GI polyps.  2. Glasses.  3. Otherwise broadly negative.   PAST SURGICAL HISTORY:  His past surgical history is notable for elbow  surgery.   SOCIAL HISTORY:  He is a Geophysical data processor.  She has four children.  No  grandchildren.  He does not use cigarettes or recreational drugs. He  does use alcohol occasionally.   CURRENT MEDICATIONS:  His medications include:  1. Toprol 50.  2. Doxycycline p.r.n.  3. Cardizem 120.  4. Aspirin 325 mg.   ALLERGIES:  He is ALLERGIC to AMPICILLIN.   PHYSICAL EXAMINATION:  VITAL  SIGNS:  His blood pressure is 112/62 with  pulse of 58 that was irregular.  His weight was 207.  HEENT:  No icterus, no xanthomata.  NECK:  The neck veins were flat.  The carotids were brisk and full  bilaterally without bruits.  MUSCULOSKELETAL:  Back is without kyphosis or scoliosis.  LUNGS:  Clear.  HEART:  Sounds were regular without murmurs, rubs or gallops.  ABDOMEN:  Soft with active bowel sounds without midline pulsations or  hepatomegaly.  EXTREMITIES:  Femoral pulses were 2+.  Distal pulses were intact.  There  was no cyanosis, clubbing or edema.  NEUROLOGICAL:  Grossly normal.  SKIN:  Warm and dry.   CLINICAL DATA:  Electrocardiogram dated today demonstrated atrial  fibrillation at 93 with intervals of -.08/0.34.  The axis was 51.   IMPRESSION:  1. Atrial fibrillation with a controlled ventricular response.  2. Abnormal thyroid tests with a depressed TSH and normal T4 and T3.  3. Normal left ventricular function.  4. [place] score of 0.  5. Negative thromboembolic risk profile, apart from a mildly depressed      HDL.   Tom, Mr. Krejci has atrial fibrillation and some kind of abnormality in his  thyroid function testing.  I discussed the latter with Dr. Ardyth Harps.  His recommendation was that we repeat it in the  absence of medications  and so I have taken the liberty of doing that.   I have also suggested to the patient that at his age restoration of  sinus and attempting to maintain sinus rhythm, with or without  antiarrhythmic drugs, is probably inappropriate in the era of atrial  fibrillation ablation as permanent atrial fibrillation has lower success  rates than dose persistent or paroxysmal.   To that end, I have recommended:  1. That we begin Coumadin.  I have discussed the potential hemorrhagic      complication risks with him and his wife and they understand and      are agreeable.  2. We will repeat the TSH, T4 and T3 and I will review these labs with      Dr.  Ardyth Harps.  We will want to make sure that his situation is      euthyroid prior to undertaking cardioversion, but in the event      there is some degree of hyperthyroidism here ongoing, this too      represents a high thromboembolic risk situation, so Coumadin would      also be appropriate.  3. In the event that he maintains sinus rhythm following his      cardioversion that would be great.  In the event that he reverts to      atrial fibrillation, I would pursue antiarrhythmic drug therapy and      I did elude to this today at least the issues of possible drug for      arrhythmia.   Thank you very much for asking [place] to see Mr. Gervase in consultation.    Sincerely,      Duke Salvia, MD, [facility]  Electronically Signed    SCK/MedQ  DD: 04/23/2007  DT: 04/24/2007  Job #: [id number]   CC:    [facility]

## 2011-02-08 NOTE — Procedures (Signed)
[facility]                                EXERCISE TREADMILL   NAME:Kyle [name], Kyle [name] MAIN                         MRN:          [id number]  DATE:04/22/2006                            DOB:          [birth year]    Duration of exercise 11 minutes, maximum heart rate 155, percent of PMHR is  95%.  Chest pain none.   COMMENTS:  The patient exercised on the Bruce protocol today.  Exercise  tolerance was good. He experienced no chest pain.  The  test was terminated  due to achieved heart rate and fatigue.  There was minimal anterolateral J  point depression without significant ST depression.  The study was felt to  be negative for significant ischemia.   RISK SUMMATION:  The patient has been fairly active. He says that the main  symptom is that he feels like his heart is beating hard.  Many of these  occur and they are not necessarily related to activity.  At the current  time, the exercise test does not suggest significant exercise-induced chest  pain or ST depression.  It would be worthwhile  for the patient to have a  lipid profile, and of note, back in 1981, his total cholesterol was 88.  I  would not recommend further cardiac testing at the present time.                                   [name]. Riley Kill, MD, [facility]   TDS/MedQ  DD:  05/01/2006  DT:  05/01/2006  Job #:  [id number]

## 2011-02-08 NOTE — Assessment & Plan Note (Signed)
[facility]                            CARDIOLOGY OFFICE NOTE   NAME:Kyle [name], Kyle [name]                         MRN:          [id number]  DATE:10/06/2006                            DOB:          05-12-1946    Kyle [name] is in for a followup. He is doing pretty well. His daughter is about  to hopefully come home from [place]. When he has palpitations, sometimes  he will take an extra half of Toprol and he does feel that this helps.   His current medications include; Toprol XL 50 mg one half tablet daily.    On physical examination today, blood pressure is 113/69, pulse 54.  LUNG FIELDS: Clear and the cardiac rhythm is regular.   The EKG reveals normal sinus rhythm within normal limits.   Liver profile is normal. Most recent lipid profile reveals a cholesterol  157, triglycerides 58, HDL 25, and LDL of 120.   He and I have talked about this in some detail. I would favor rechecking  his lipid and liver in about 2 to 3 months. I have recommended  aggressive dietary therapy and sent him to the American Heart  Association web site. He will remain on low dose Toprol for his  palpitations. He will return to see [place] in 6 months. Should his LDL be  moderately elevated and HDL still low then I would consider recommending  that a lipo med profile be done.     [name]. Riley Kill, MD, [facility]  Electronically Signed    TDS/MedQ  DD: 10/06/2006  DT: 10/06/2006  Job #: [id number]

## 2011-02-08 NOTE — Op Note (Signed)
NAME:  Kyle [name], Kyle [name]                  ACCOUNT NO.:  [id number]   MEDICAL RECORD NO.:  [id number]          PATIENT TYPE:  AMB   LOCATION:  ENDO                         [facility]AC[facility]  [facility]   PHYSICIAN:  James L. Malon Kindle., M.D.DATE OF BIRTH:  [birth year]   DATE OF PROCEDURE:  04/29/2005  DATE OF DISCHARGE:                                 OPERATIVE REPORT   PROCEDURE:  Colonoscopy.   MEDICATIONS:  Fentanyl 75 mcg, Versed 6 mg IV.   SCOPE:  Olympus pediatric scope.   INDICATION:  Previous history of adenomatous colon polyps.   DESCRIPTION OF PROCEDURE:  The procedure explained to the patient and  consent obtained.  Left lateral decubitus position, the scope inserted and  advanced.  Prep excellent.  Moderate diverticulosis in the sigmoid colon.  We were able to pass this and advance to the level of the ileocecal valve.  The tip of the cecum could be seen in the distance, was normal.  Scope  withdrawn, the mucosa carefully examined on withdrawal.  No polyps seen  throughout the entire colon.  Diverticulosis in the sigmoid colon.  No  polyps seen.  Rectum free of polyps.  The scope withdrawn.  The patient  tolerated the procedure well.   ASSESSMENT:  1.  History of polyps with negative colonoscopy at this time.  V12.72.  2.  Diverticulosis.  562.10.   PLAN:  1.  We will recommend yearly hemoccults and repeat procedure in 5 years.       JLE/MEDQ  D:  04/29/2005  T:  04/29/2005  Job:  [id number]   cc:   Teena Irani. Arlyce Dice, M.D.  [address]  [place]  [place] [address]  Fax: [phone]

## 2011-05-13 ENCOUNTER — Ambulatory Visit (INDEPENDENT_AMBULATORY_CARE_PROVIDER_SITE_OTHER): Payer: Self-pay | Admitting: General Surgery

## 2011-05-20 ENCOUNTER — Encounter (INDEPENDENT_AMBULATORY_CARE_PROVIDER_SITE_OTHER): Payer: Self-pay | Admitting: General Surgery

## 2011-05-20 ENCOUNTER — Ambulatory Visit (INDEPENDENT_AMBULATORY_CARE_PROVIDER_SITE_OTHER): Payer: 59 | Admitting: General Surgery

## 2011-05-20 VITALS — BP 112/78 | HR 64 | Temp 97.7°F | Ht 73.0 in | Wt 205.4 lb

## 2011-05-20 DIAGNOSIS — Z86010 Personal history of colonic polyps: Secondary | ICD-10-CM

## 2011-05-20 NOTE — Patient Instructions (Signed)
Your rectal exam and anoscopy today shows no evidence of any polyps. He should contact Dr. Carman Ching and get a colonoscopy in about 6 months. I would suggest an anoscopy in my office about one year after the  next colonoscopy.

## 2011-05-20 NOTE — Progress Notes (Signed)
Chief Complaint  Patient presents with  . Other    long term f/u reck colon    HPI Kyle [name] is a 65 y.o. male.    This patient underwent transanal excision of a small villous adenoma of the rectum in the left lateral wall on September 12, 2010. This was a benign tubulovillous adenoma with dysplasia. He's had colon polyps removed by Dr. Randa Evens in the past.  Last anoscopy was Feb. 23,0 2012.  Since that time he has had no pain no bleeding and feels normal. He is here for surveillance anoscopy.HPI  Past Medical History  Diagnosis Date  . Chest pain   . Fatigue   . Atrial fibrillation   . Atrial fib/flutter, transient     Past Surgical History  Procedure Date  . Elbow surgery     right  . Colon surgery 2011    COLONOSCOPY    Family History  Problem Relation Age of Onset  . Stroke Mother     Social History History  Substance Use Topics  . Smoking status: Never Smoker   . Smokeless tobacco: Not on file  . Alcohol Use: 0.0 oz/week    3-4 Glasses of wine per week     occasional    Allergies  Allergen Reactions  . Ampicillin     REACTION: Rash    Current Outpatient Prescriptions  Medication Sig Dispense Refill  . aspirin 325 MG tablet Take 325 mg by mouth daily.        Marland Kitchen diltiazem (DILACOR XR) 120 MG 24 hr capsule Take 120 mg by mouth daily.        Marland Kitchen doxycycline (VIBRAMYCIN) 100 MG capsule Take 100 mg by mouth daily.        . metoprolol (LOPRESSOR) 50 MG tablet Take 50 mg by mouth daily.          Review of Systems ROS  Blood pressure 112/78, pulse 64, temperature 97.7 F (36.5 C), temperature source Temporal, height 6\' 1"  (1.854 m), weight 205 lb 6.4 oz (93.169 kg).  Physical Exam Physical Exam  Patient looks well and is in no distress.  Rectal exam externally is normal. Skin is healthy. No masses, fissures, fistulas or hemorrhoids. Digital rectal exam reveals normal sphincter tone no mass no blood or tenderness. Anoscopy is done with the large  anoscope. He has some internal hemorrhoids circumferentially but there is no evidence of any neoplasia or polyps. Data Reviewed i reviewed the old chart.  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    Plan    He will obtain a colonoscopy with Dr. Ramon Dredge within the next 6 months.  Assuming nothing is found, we will probably do a anoscopy in the office in about 18 months. He is comfortable with this plan.      [name] M 05/20/2011, 4:46 PM

## 2011-06-14 LAB — BASIC METABOLIC PANEL
BUN: 12
Chloride: 107
Creatinine, Ser: 0.88
GFR calc non Af Amer: >60
Glucose, Bld: 105 — ABNORMAL HIGH

## 2011-06-14 LAB — PROTIME-INR: Prothrombin Time: 24.5 — ABNORMAL HIGH

## 2011-06-14 LAB — CBC
MCV: 89.8
Platelets: 208
RDW: 13.2
WBC: 5.5

## 2011-07-10 ENCOUNTER — Other Ambulatory Visit: Payer: Self-pay | Admitting: Cardiology

## 2011-09-11 ENCOUNTER — Other Ambulatory Visit: Payer: Self-pay | Admitting: Gastroenterology

## 2011-10-04 ENCOUNTER — Encounter (INDEPENDENT_AMBULATORY_CARE_PROVIDER_SITE_OTHER): Payer: Self-pay | Admitting: Gastroenterology

## 2011-10-23 ENCOUNTER — Ambulatory Visit
Admission: RE | Admit: 2011-10-23 | Discharge: 2011-10-23 | Disposition: A | Discharge status: Home / Self Care | Payer: 59 | Source: Ambulatory Visit | Attending: Family Medicine | Admitting: Family Medicine

## 2011-10-23 ENCOUNTER — Other Ambulatory Visit: Payer: Self-pay | Admitting: Family Medicine

## 2011-10-23 DIAGNOSIS — R1032 Left lower quadrant pain: Secondary | ICD-10-CM

## 2012-05-04 IMAGING — CR DG CHEST 2V
2 series · 2 of 2 positions shown · non-contrast
Comparison: 10/15/2007

CLINICAL DATA: Preop for removal of rectal polyp.  Nonsmoker.
Hypertension.  No chest complaints.

CHEST - 2 VIEW

[w chest pa]
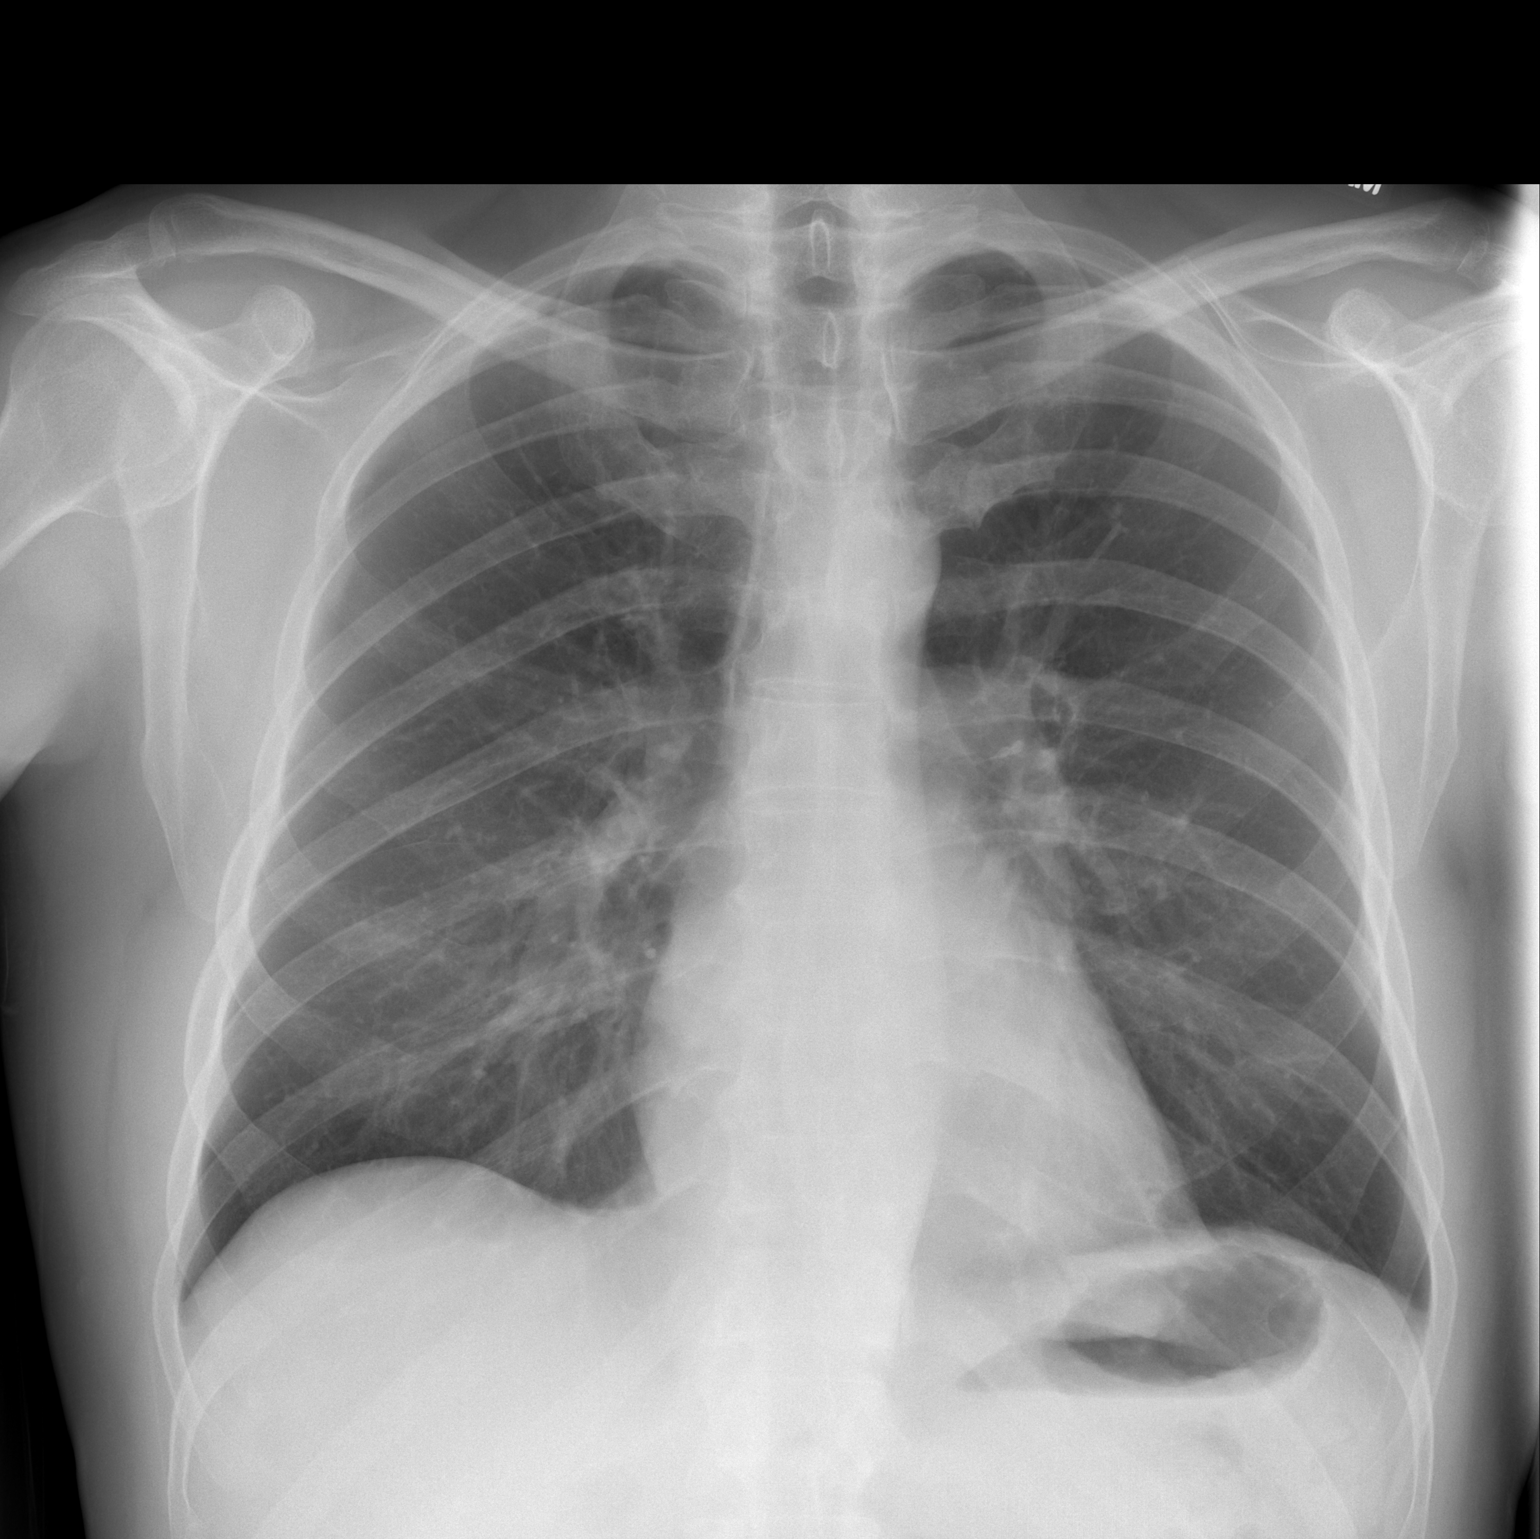

[w chest lat]
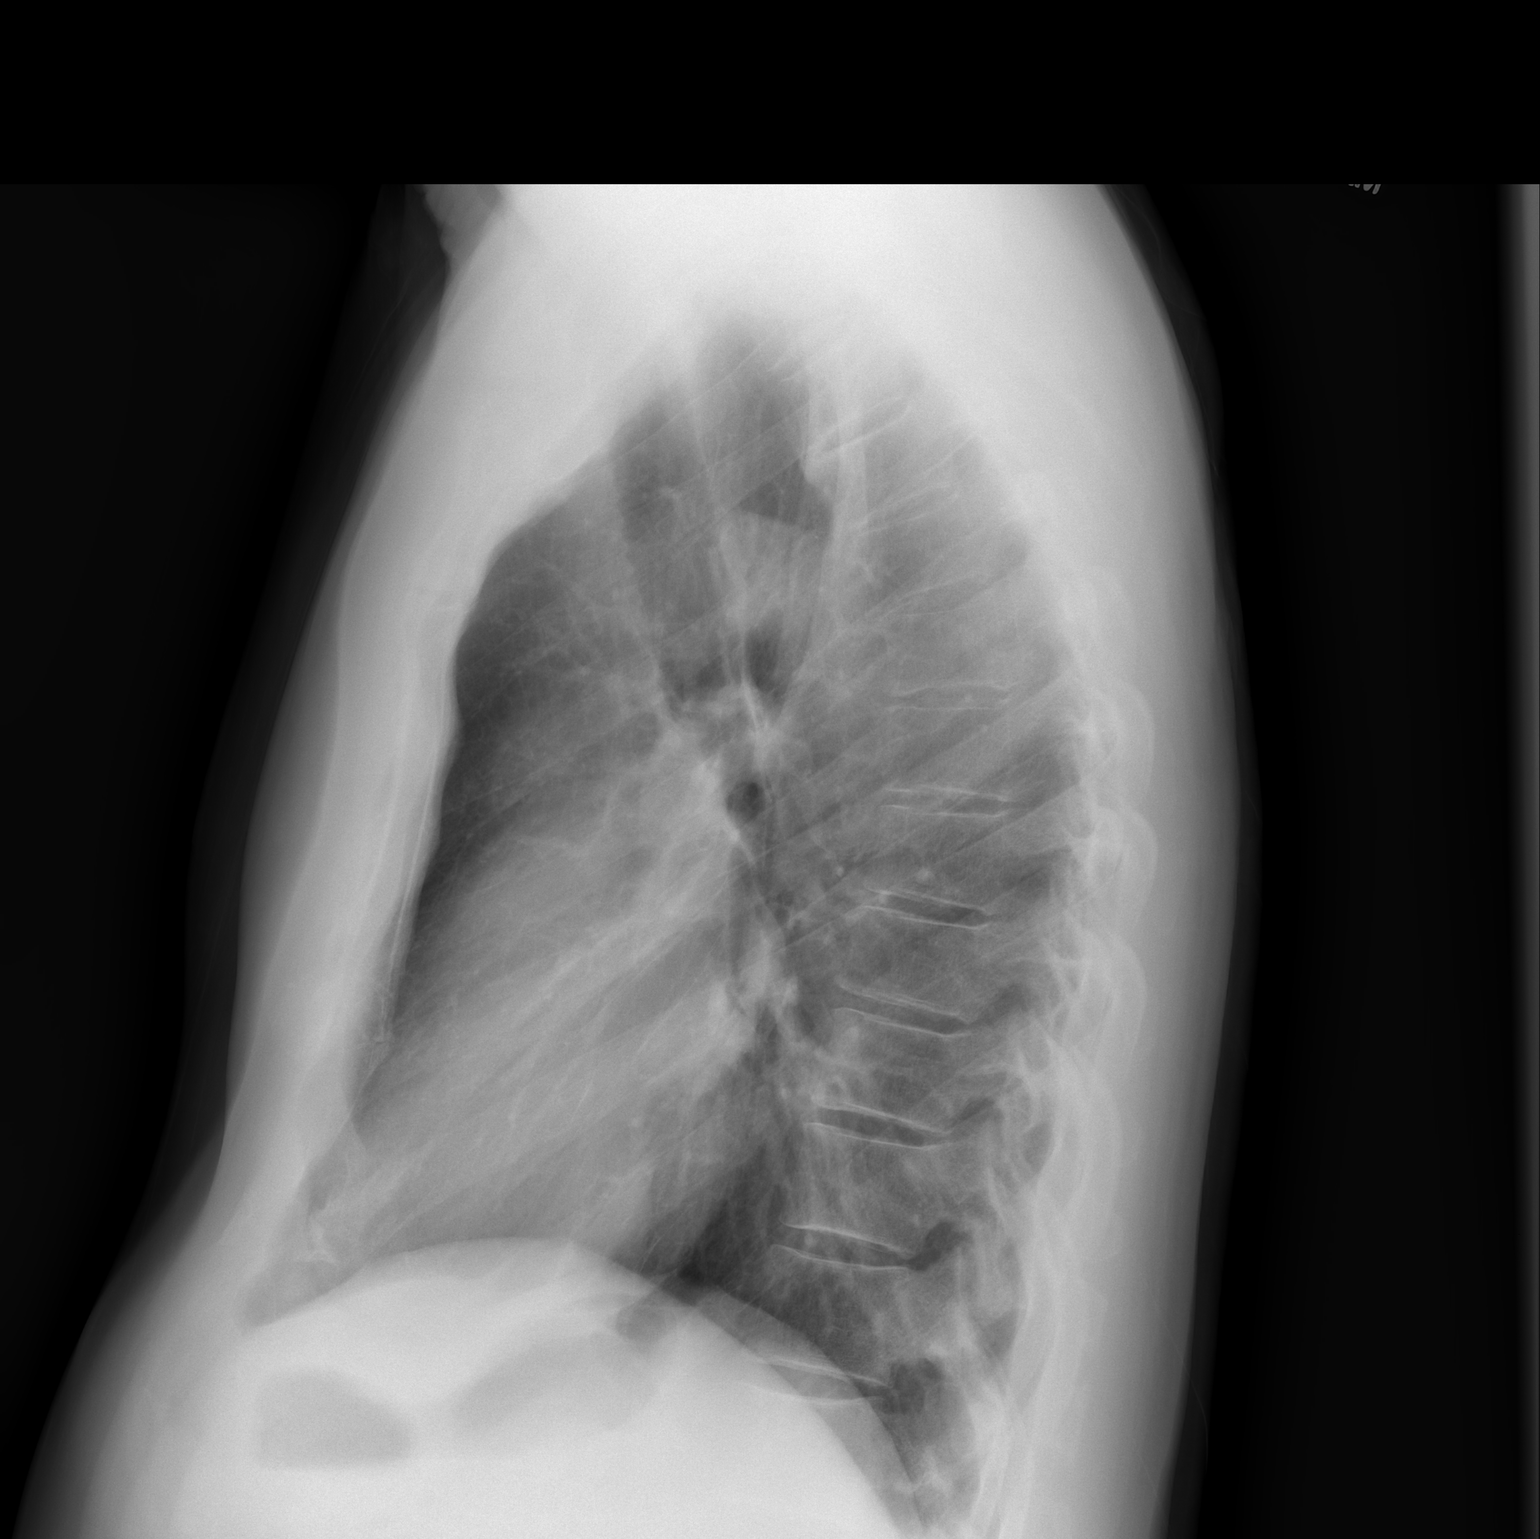

[2 of 2 positions shown; findings below may reference images not displayed]

FINDINGS: The costophrenic angles are minimally excluded from the
lateral view. Midline trachea.  Normal heart size and mediastinal
contours.  No pleural effusion or pneumothorax.  Clear lungs.
IMPRESSION: Normal chest for age.

## 2012-08-04 ENCOUNTER — Encounter (HOSPITAL_BASED_OUTPATIENT_CLINIC_OR_DEPARTMENT_OTHER): Payer: Self-pay | Admitting: *Deleted

## 2012-08-05 ENCOUNTER — Encounter (HOSPITAL_BASED_OUTPATIENT_CLINIC_OR_DEPARTMENT_OTHER): Payer: Self-pay | Admitting: *Deleted

## 2012-08-05 NOTE — Progress Notes (Signed)
NPO AFTER MN. ARRIVES AT 0600. NEEDS ISTAT AND EKG. 

## 2012-08-06 ENCOUNTER — Other Ambulatory Visit: Payer: Self-pay | Admitting: Orthopedic Surgery

## 2012-08-06 NOTE — Anesthesia Preprocedure Evaluation (Addendum)
Anesthesia Evaluation  Patient identified by MRN, date of birth, ID band Patient awake    Reviewed: Allergy & Precautions, H&P , NPO status , Patient's Chart, lab work & pertinent test results, reviewed documented beta blocker date and time   History of Anesthesia Complications (+) PONV  Airway Mallampati: II TM Distance: >3 FB Neck ROM: full    Dental No notable dental hx. (+) Teeth Intact and Dental Advisory Given   Pulmonary neg pulmonary ROS,  breath sounds clear to auscultation  Pulmonary exam normal       Cardiovascular Exercise Tolerance: Good + dysrhythmias Atrial Fibrillation Rhythm:regular Rate:Normal     Neuro/Psych vertigo negative neurological ROS  negative psych ROS   GI/Hepatic negative GI ROS, Neg liver ROS,   Endo/Other  Hyperthyroidism No meds or symptoms  Renal/GU negative Renal ROS  negative genitourinary   Musculoskeletal   Abdominal   Peds  Hematology negative hematology ROS (+)   Anesthesia Other Findings   Reproductive/Obstetrics negative OB ROS                         Anesthesia Physical Anesthesia Plan  ASA: III  Anesthesia Plan: General   Post-op Pain Management:    Induction: Intravenous  Airway Management Planned: LMA  Additional Equipment:   Intra-op Plan:   Post-operative Plan:   Informed Consent: I have reviewed the patients History and Physical, chart, labs and discussed the procedure including the risks, benefits and alternatives for the proposed anesthesia with the patient or authorized representative who has indicated his/her understanding and acceptance.   Dental Advisory Given  Plan Discussed with: CRNA and Surgeon  Anesthesia Plan Comments:        Anesthesia Quick Evaluation

## 2012-08-07 ENCOUNTER — Encounter (HOSPITAL_BASED_OUTPATIENT_CLINIC_OR_DEPARTMENT_OTHER): Payer: Self-pay | Admitting: Anesthesiology

## 2012-08-07 ENCOUNTER — Ambulatory Visit (HOSPITAL_BASED_OUTPATIENT_CLINIC_OR_DEPARTMENT_OTHER)
Admission: RE | Admit: 2012-08-07 | Discharge: 2012-08-07 | Disposition: A | Discharge status: Home / Self Care | Payer: Medicare Other | Source: Ambulatory Visit | Attending: Orthopedic Surgery | Admitting: Orthopedic Surgery

## 2012-08-07 ENCOUNTER — Encounter (HOSPITAL_BASED_OUTPATIENT_CLINIC_OR_DEPARTMENT_OTHER)
Admission: RE | Disposition: A | Discharge status: Home / Self Care | Payer: Self-pay | Source: Ambulatory Visit | Attending: Orthopedic Surgery

## 2012-08-07 ENCOUNTER — Encounter (HOSPITAL_BASED_OUTPATIENT_CLINIC_OR_DEPARTMENT_OTHER): Payer: Self-pay | Admitting: *Deleted

## 2012-08-07 ENCOUNTER — Ambulatory Visit (HOSPITAL_BASED_OUTPATIENT_CLINIC_OR_DEPARTMENT_OTHER): Payer: Medicare Other | Admitting: Anesthesiology

## 2012-08-07 DIAGNOSIS — X58XXXA Exposure to other specified factors, initial encounter: Secondary | ICD-10-CM | POA: Insufficient documentation

## 2012-08-07 DIAGNOSIS — M1990 Unspecified osteoarthritis, unspecified site: Secondary | ICD-10-CM | POA: Insufficient documentation

## 2012-08-07 DIAGNOSIS — M2340 Loose body in knee, unspecified knee: Secondary | ICD-10-CM | POA: Insufficient documentation

## 2012-08-07 DIAGNOSIS — S83289A Other tear of lateral meniscus, current injury, unspecified knee, initial encounter: Secondary | ICD-10-CM | POA: Insufficient documentation

## 2012-08-07 DIAGNOSIS — Z9889 Other specified postprocedural states: Secondary | ICD-10-CM

## 2012-08-07 DIAGNOSIS — M2240 Chondromalacia patellae, unspecified knee: Secondary | ICD-10-CM | POA: Insufficient documentation

## 2012-08-07 HISTORY — DX: Gastro-esophageal reflux disease without esophagitis: K21.9

## 2012-08-07 HISTORY — PX: KNEE ARTHROSCOPY WITH LATERAL MENISECTOMY: SHX6193

## 2012-08-07 HISTORY — DX: Personal history of other diseases of the nervous system and sense organs: Z86.69

## 2012-08-07 HISTORY — DX: Other specified postprocedural states: Z98.890

## 2012-08-07 HISTORY — DX: Personal history of urinary calculi: Z87.442

## 2012-08-07 HISTORY — DX: Thyrotoxicosis, unspecified without thyrotoxic crisis or storm: E05.90

## 2012-08-07 HISTORY — DX: Other specified postprocedural states: R11.2

## 2012-08-07 LAB — POCT I-STAT 4, (NA,K, GLUC, HGB,HCT)
Glucose, Bld: 113 mg/dL — ABNORMAL HIGH (ref 70–99)
Potassium: 4.5 mEq/L (ref 3.5–5.1)

## 2012-08-07 SURGERY — ARTHROSCOPY, KNEE, WITH LATERAL MENISCECTOMY
Anesthesia: General | Site: Knee | Laterality: Left | Wound class: Clean

## 2012-08-07 MED ORDER — MIDAZOLAM HCL 5 MG/5ML IJ SOLN
INTRAMUSCULAR | Status: DC | PRN
  Administered 2012-08-07: 1 mg via INTRAVENOUS

## 2012-08-07 MED ORDER — FENTANYL CITRATE 0.05 MG/ML IJ SOLN
25.0000 ug | INTRAMUSCULAR | Status: DC | PRN
  Filled 2012-08-07: qty 1

## 2012-08-07 MED ORDER — LACTATED RINGERS IV SOLN
INTRAVENOUS | Status: DC
  Administered 2012-08-07 (×2): via INTRAVENOUS
  Filled 2012-08-07: qty 1000

## 2012-08-07 MED ORDER — DEXAMETHASONE SODIUM PHOSPHATE 4 MG/ML IJ SOLN
INTRAMUSCULAR | Status: DC | PRN
  Administered 2012-08-07: 4 mg via INTRAVENOUS

## 2012-08-07 MED ORDER — OXYCODONE-ACETAMINOPHEN 7.5-325 MG PO TABS: 1.0000 | ORAL_TABLET | ORAL | Status: DC | PRN

## 2012-08-07 MED ORDER — METHOCARBAMOL 500 MG PO TABS: 500.0000 mg | ORAL_TABLET | Freq: Four times a day (QID) | ORAL | Status: DC

## 2012-08-07 MED ORDER — SODIUM CHLORIDE 0.9 % IR SOLN
Status: DC | PRN
  Administered 2012-08-07: 08:00:00

## 2012-08-07 MED ORDER — FENTANYL CITRATE 0.05 MG/ML IJ SOLN
INTRAMUSCULAR | Status: DC | PRN
  Administered 2012-08-07: 50 ug via INTRAVENOUS
  Administered 2012-08-07 (×4): 25 ug via INTRAVENOUS

## 2012-08-07 MED ORDER — LIDOCAINE HCL (CARDIAC) 20 MG/ML IV SOLN
INTRAVENOUS | Status: DC | PRN
  Administered 2012-08-07: 60 mg via INTRAVENOUS

## 2012-08-07 MED ORDER — PROPOFOL 10 MG/ML IV BOLUS
INTRAVENOUS | Status: DC | PRN
  Administered 2012-08-07: 180 mg via INTRAVENOUS

## 2012-08-07 MED ORDER — BUPIVACAINE-EPINEPHRINE 0.5% -1:200000 IJ SOLN
INTRAMUSCULAR | Status: DC | PRN
  Administered 2012-08-07: 20 mL

## 2012-08-07 MED ORDER — LACTATED RINGERS IV SOLN
INTRAVENOUS | Status: DC
  Filled 2012-08-07: qty 1000

## 2012-08-07 MED ORDER — MORPHINE SULFATE 4 MG/ML IJ SOLN
INTRAMUSCULAR | Status: DC | PRN
  Administered 2012-08-07: 4 mg via INTRAVENOUS

## 2012-08-07 MED ORDER — SCOPOLAMINE 1 MG/3DAYS TD PT72
1.0000 | MEDICATED_PATCH | TRANSDERMAL | Status: DC
  Administered 2012-08-07: 1.5 mg via TRANSDERMAL
  Filled 2012-08-07: qty 1

## 2012-08-07 MED ORDER — POVIDONE-IODINE 7.5 % EX SOLN
Freq: Once | CUTANEOUS | Status: DC
  Filled 2012-08-07: qty 118

## 2012-08-07 MED ORDER — ONDANSETRON HCL 4 MG/2ML IJ SOLN
INTRAMUSCULAR | Status: DC | PRN
  Administered 2012-08-07: 4 mg via INTRAVENOUS

## 2012-08-07 SURGICAL SUPPLY — 41 items
BANDAGE ELASTIC 6 VELCRO ST LF (GAUZE/BANDAGES/DRESSINGS) ×3 IMPLANT
BANDAGE ESMARK 6X9 LF (GAUZE/BANDAGES/DRESSINGS) ×1 IMPLANT
BANDAGE GAUZE ELAST BULKY 4 IN (GAUZE/BANDAGES/DRESSINGS) ×2 IMPLANT
BLADE CUDA SHAVER 3.5 (BLADE) ×2 IMPLANT
BNDG CMPR 9X6 STRL LF SNTH (GAUZE/BANDAGES/DRESSINGS) ×1
BNDG ESMARK 6X9 LF (GAUZE/BANDAGES/DRESSINGS) ×2
CANISTER SUCT LVC 12 LTR MEDI- (MISCELLANEOUS) ×4 IMPLANT
CANISTER SUCTION 1200CC (MISCELLANEOUS) ×2 IMPLANT
CLOTH BEACON ORANGE TIMEOUT ST (SAFETY) ×2 IMPLANT
DRAPE ARTHROSCOPY W/POUCH 114 (DRAPES) ×2 IMPLANT
DRAPE LG THREE QUARTER DISP (DRAPES) ×1 IMPLANT
DRSG ADAPTIC 3X8 NADH LF (GAUZE/BANDAGES/DRESSINGS) ×1 IMPLANT
DRSG EMULSION OIL 3X3 NADH (GAUZE/BANDAGES/DRESSINGS) ×2 IMPLANT
DURAPREP 26ML APPLICATOR (WOUND CARE) ×2 IMPLANT
ELECT REM PT RETURN 9FT ADLT (ELECTROSURGICAL)
ELECTRODE REM PT RTRN 9FT ADLT (ELECTROSURGICAL) IMPLANT
GAUZE SPONGE 4X4 12PLY STRL LF (GAUZE/BANDAGES/DRESSINGS) ×1 IMPLANT
GLOVE BIOGEL PI IND STRL 8 (GLOVE) ×1 IMPLANT
GLOVE BIOGEL PI INDICATOR 8 (GLOVE) ×1
GLOVE ECLIPSE 8.0 STRL XLNG CF (GLOVE) ×4 IMPLANT
GLOVE INDICATOR 8.0 STRL GRN (GLOVE) ×3 IMPLANT
GOWN PREVENTION PLUS LG XLONG (DISPOSABLE) ×3 IMPLANT
GOWN STRL REIN XL XLG (GOWN DISPOSABLE) ×2 IMPLANT
IV NS IRRIG 3000ML ARTHROMATIC (IV SOLUTION) ×4 IMPLANT
KNEE WRAP E Z 3 GEL PACK (MISCELLANEOUS) ×2 IMPLANT
NDL HYPO 18GX1.5 BLUNT FILL (NEEDLE) ×1 IMPLANT
NDL SAFETY ECLIPSE 18X1.5 (NEEDLE) ×1 IMPLANT
NEEDLE HYPO 18GX1.5 BLUNT FILL (NEEDLE) ×2 IMPLANT
NEEDLE HYPO 18GX1.5 SHARP (NEEDLE) ×2
PACK ARTHROSCOPY DSU (CUSTOM PROCEDURE TRAY) ×2 IMPLANT
PACK BASIN DAY SURGERY FS (CUSTOM PROCEDURE TRAY) ×2 IMPLANT
PADDING CAST ABS 4INX4YD NS (CAST SUPPLIES) ×1
PADDING CAST ABS COTTON 4X4 ST (CAST SUPPLIES) ×1 IMPLANT
PENCIL BUTTON HOLSTER BLD 10FT (ELECTRODE) IMPLANT
SET ARTHROSCOPY TUBING (MISCELLANEOUS) ×2
SET ARTHROSCOPY TUBING LN (MISCELLANEOUS) ×1 IMPLANT
SPONGE GAUZE 4X4 12PLY (GAUZE/BANDAGES/DRESSINGS) ×2 IMPLANT
SUT ETHILON 4 0 PS 2 18 (SUTURE) ×2 IMPLANT
SYRINGE 10CC LL (SYRINGE) ×2 IMPLANT
TOWEL OR 17X24 6PK STRL BLUE (TOWEL DISPOSABLE) ×2 IMPLANT
WATER STERILE IRR 500ML POUR (IV SOLUTION) ×2 IMPLANT

## 2012-08-07 NOTE — Brief Op Note (Signed)
08/07/2012  9:20 AM  PATIENT:  Kyle [name]  66 y.o. male  PRE-OPERATIVE DIAGNOSIS:  Left Knee Torn Lateral meniscus with Loose Body  POST-OPERATIVE DIAGNOSIS:  Left Knee Torn Lateral meniscus   PROCEDURE:  Procedure(s) (LRB) with comments: KNEE ARTHROSCOPY WITH LATERAL MENISECTOMY (Left) - Left Knee Arthroscopy with Partial Lateral Menisectomy   SURGEON:  Surgeon(s) and Role:    * Drucilla Schmidt, MD - Primary  PHYSICIAN ASSISTANT:   ASSISTANTS:nurse   ANESTHESIA:   local and general  EBL:  Total I/O In: 1100 [I.V.:1100] Out: -   BLOOD ADMINISTERED:none  DRAINS: none   LOCAL MEDICATIONS USED:  MARCAINE     SPECIMEN:  No Specimen  DISPOSITION OF SPECIMEN:  N/A  COUNTS:  YES  TOURNIQUET:   Total Tourniquet Time Documented: Thigh (Left) - 56 minutes  DICTATION: .Other Dictation: Dictation Number [id number]  PLAN OF CARE: Discharge to home after PACU  PATIENT DISPOSITION:  PACU - hemodynamically stable.   Delay start of Pharmacological VTE agent (>24hrs) due to surgical blood loss or risk of bleeding: not applicable

## 2012-08-07 NOTE — Anesthesia Postprocedure Evaluation (Signed)
  Anesthesia Post-op Note  Patient: Kyle [name]  Procedure(s) Performed: Procedure(s) (LRB): KNEE ARTHROSCOPY WITH LATERAL MENISECTOMY (Left)  Patient Location: PACU  Anesthesia Type: General  Level of Consciousness: awake and alert   Airway and Oxygen Therapy: Patient Spontanous Breathing  Post-op Pain: mild  Post-op Assessment: Post-op Vital signs reviewed, Patient's Cardiovascular Status Stable, Respiratory Function Stable, Patent Airway and No signs of Nausea or vomiting  Post-op Vital Signs: stable  Complications: No apparent anesthesia complications

## 2012-08-07 NOTE — H&P (Signed)
Kyle [name] is an 66 y.o. male.   Chief Complaint: painful lt knee HPI:MRI demonstrates torn lateral meniscus and loose bodies  Past Medical History  Diagnosis Date  . PONV (postoperative nausea and vomiting)   . History of kidney stones 10-23-2011  SPONTANEOUSLY PASSED  . History of migraine   . Mild acid reflux OCCASIONAL-  WATCHES DIET  . Hyperthyroidism NO MEDS OR TX    MONITORED BY DR Evlyn Kanner  . Atrial fibrillation CARDIOLOGSIT-  DR Riley Kill    Past Surgical History  Procedure Date  . Elbow surgery     right  . Transanal excision of villous adenoma of the rectum 09-12-2010  DR INGRAM    LEFT LATERAL WALL  . Cardioversion 10-15-2007  DR Riley Kill    NEW ON-SET A-FIB  . Right elbow surgery 1992 (APPROX)    TENDINITIS  . Vasectomy reversal 1990  (APPROX)    Family History  Problem Relation Age of Onset  . Stroke Mother    Social History:  reports that he has never smoked. He has never used smokeless tobacco. He reports that he drinks alcohol. He reports that he does not use illicit drugs.  Allergies:  Allergies  Allergen Reactions  . Ampicillin Rash    Medications Prior to Admission  Medication Sig Dispense Refill  . aspirin 325 MG tablet Take 325 mg by mouth daily.       Marland Kitchen diltiazem (CARDIZEM CD) 120 MG 24 hr capsule       . doxycycline (VIBRAMYCIN) 100 MG capsule Take 100 mg by mouth daily.       . metoprolol (LOPRESSOR) 50 MG tablet Take 50 mg by mouth every evening.       . naproxen sodium (ANAPROX) 220 MG tablet Take 220 mg by mouth as needed.        Results for orders placed during the hospital encounter of 08/07/12 (from the past 48 hour(s))  POCT I-STAT 4, (NA,K, GLUC, HGB,HCT)     Status: Abnormal   Collection Time   08/07/12  6:51 AM      Component Value Range Comment   Sodium 143  135 - 145 mEq/L    Potassium 4.5  3.5 - 5.1 mEq/L    Glucose, Bld 113 (*) 70 - 99 mg/dL    HCT 16.1  09.6 - 04.5 %    Hemoglobin 17.3 (*) 13.0 - 17.0 g/dL    No results  found.  ROS  Blood pressure 104/74, pulse 61, temperature 97.8 F (36.6 C), temperature source Oral, resp. rate 61, height 6\' 1"  (1.854 m), weight 95.255 kg (210 lb), SpO2 94.00%. Physical Exam  Constitutional: He is oriented to person, place, and time. He appears well-developed and well-nourished.  HENT:  Head: Normocephalic and atraumatic.  Right Ear: External ear normal.  Left Ear: External ear normal.  Eyes: Conjunctivae normal and EOM are normal. Pupils are equal, round, and reactive to light.  Neck: Normal range of motion. Neck supple.  Cardiovascular: Normal rate, normal heart sounds and intact distal pulses.        Irregular rhythm  Respiratory: Effort normal and breath sounds normal.  GI: Soft. Bowel sounds are normal.  Musculoskeletal: Normal range of motion.  Neurological: He is alert and oriented to person, place, and time. He has normal reflexes.  Skin: Skin is warm and dry.  Psychiatric: He has a normal mood and affect. His behavior is normal. Judgment and thought content normal.     Assessment/Plan Torn lateral  meniscus and loose bodies lt knee Lt knee arthroscopy with partial lateral meniscectomy and removal loose bodies  APLINGTON,JAMES P 08/07/2012, 7:33 AM

## 2012-08-07 NOTE — Anesthesia Procedure Notes (Signed)
Procedure Name: LMA Insertion Date/Time: 08/07/2012 7:46 AM Performed by: Renella Cunas D Pre-anesthesia Checklist: Patient identified, Emergency Drugs available, Suction available and Patient being monitored Patient Re-evaluated:Patient Re-evaluated prior to inductionOxygen Delivery Method: Circle System Utilized Preoxygenation: Pre-oxygenation with 100% oxygen Intubation Type: IV induction Ventilation: Mask ventilation without difficulty LMA: LMA inserted LMA Size: 4.0 Number of attempts: 1 Airway Equipment and Method: bite block Placement Confirmation: positive ETCO2 Tube secured with: Tape Dental Injury: Teeth and Oropharynx as per pre-operative assessment

## 2012-08-07 NOTE — Transfer of Care (Signed)
Immediate Anesthesia Transfer of Care Note  Patient: Kyle [name]  Procedure(s) Performed: Procedure(s) (LRB): KNEE ARTHROSCOPY WITH LATERAL MENISECTOMY (Left)  Patient Location: PACU  Anesthesia Type: General  Level of Consciousness: awake, oriented, sedated and patient cooperative  Airway & Oxygen Therapy: Patient Spontanous Breathing and Patient connected to face mask oxygen  Post-op Assessment: Report given to PACU RN and Post -op Vital signs reviewed and stable  Post vital signs: Reviewed and stable  Complications: No apparent anesthesia complications

## 2012-08-10 ENCOUNTER — Encounter (HOSPITAL_BASED_OUTPATIENT_CLINIC_OR_DEPARTMENT_OTHER): Payer: Self-pay | Admitting: Orthopedic Surgery

## 2012-08-10 NOTE — Op Note (Signed)
NAME:  Kyle [name], Kyle [name] NO.:  [id number]  MEDICAL RECORD NO.:  [id number]  LOCATION:                                 FACILITY:  PHYSICIAN:  Marlowe Kays, M.D.       DATE OF BIRTH:  DATE OF PROCEDURE:  08/07/2012 DATE OF DISCHARGE:                              OPERATIVE REPORT   PREOPERATIVE DIAGNOSES: 1. Torn lateral meniscus. 2. Osteoarthritis 3. Loose bodies, left knee.  POSTOPERATIVE DIAGNOSES: 1. Torn lateral meniscus. 2. Osteoarthritis. 3. No loose bodies found.  OPERATION:  Left knee arthroscopy with partial lateral meniscectomy.  SURGEON:  Marlowe Kays, MD  ASSISTANT:  Nurse.  ANESTHESIA:  General.  PATHOLOGY AND JUSTIFICATION FOR PROCEDURE:  Pain and swelling of left knee with MRI demonstrating the preoperative diagnoses.  PROCEDURE:  Satisfactory general anesthesia, Ace wrap, and knee support to right lower extremity, pneumatic tourniquet applied to left lower extremity with the leg Esmarch out nonsterilely and tourniquet inflated to 325 mmHg.  Thigh stabilizer applied.  Left leg was then prepped with DuraPrep from stabilizer to ankle and draped in sterile field.  Time-out performed.  Superior medial saline inflow.  First, an anterolateral portal, medial compartment of knee joint was evaluated.  Medial compartment of knee joint looked normal.  ACL was normal.  Looking at the medial gutter and suprapatellar area, he had good bit of wear of the trochlear notch, which was not shavable and also some mild chondromalacia of the patella which again was not shavable.  I did not see any loose bodies in the suprapatellar area.  I then reversed portals.  He had fraying type tear of the mid third of the lateral meniscus, but as depicted on the MRI a horizontal tear of the posterior curve.  I resected both these back to stable rim with a combination of baskets and the 3.5 shaver.  He had a fairly deep furrow from front to back in the lateral  tibial plateau as depicted on the MRI.  There was nothing shavable there.  I also did not notice any loose bodies in the lateral compartment of the knee joint either.  Knee joint was then irrigated to clear and all fluid possible removed.  I closed the 2 entry portals with 4-0 nylon and then injected through the inflow apparatus 4 mg of morphine and 20 mL of 0.5% Marcaine with adrenaline.  This portal was then closed with 4- 0 nylon as well.  Betadine, Adaptic, dry sterile dressing were applied. Tourniquet was released.  She tolerated the procedure well, was taken to recovery room in satisfied condition with no known complications.          ______________________________ Marlowe Kays, M.D.     JA/MEDQ  D:  08/07/2012  T:  08/08/2012  Job:  [id number]

## 2013-06-19 IMAGING — CT CT ABD-PELV W/O CM
2 of 4 series · 17 of 46 positions shown, 19 images · non-contrast
Comparison: None.

CLINICAL DATA: Left lower quadrant pain, microhematuria.

CT ABDOMEN AND PELVIS WITHOUT CONTRAST
TECHNIQUE: Multidetector CT imaging of the abdomen and pelvis was
performed following the standard protocol without intravenous
contrast.

[Series 2: renal stone w/o · axial · non-contrast · 0.82mm/px · z∈[-395,+40]mm · 14 of 95 slices shown, 16 images]
[im 4/95  soft-tissue]
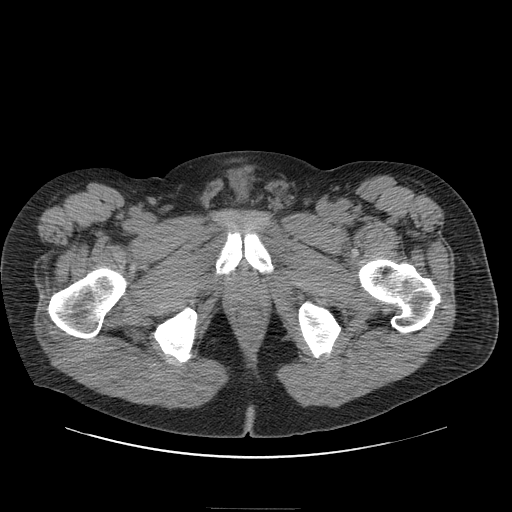
[im 4/95  bone]
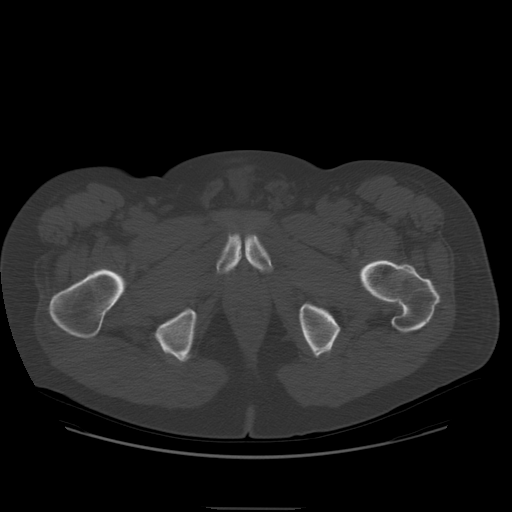
[im 12/95  soft-tissue]
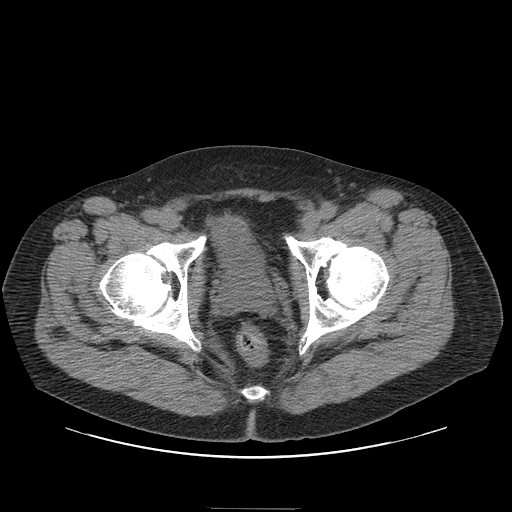
[im 20/95  soft-tissue]
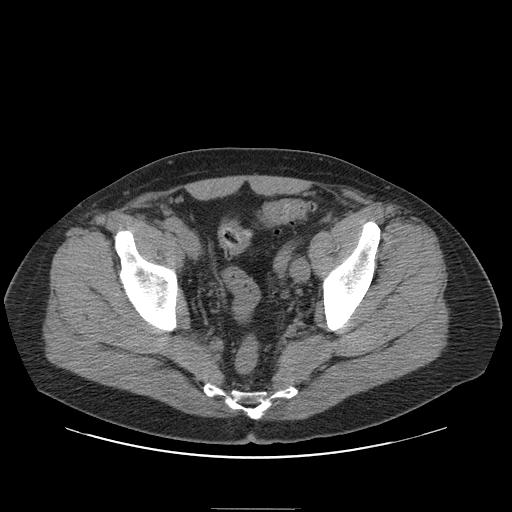
[im 24/95  soft-tissue]
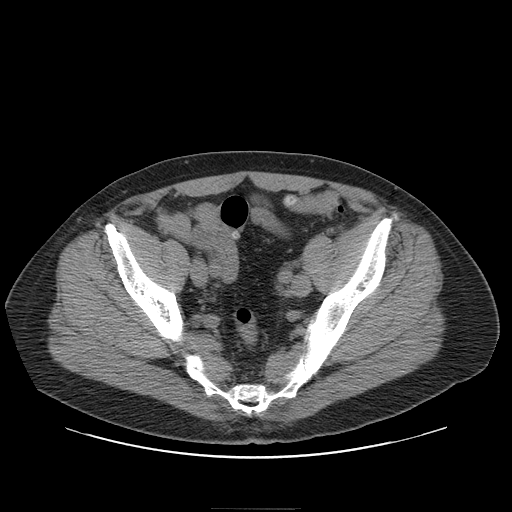
[im 32/95  soft-tissue]
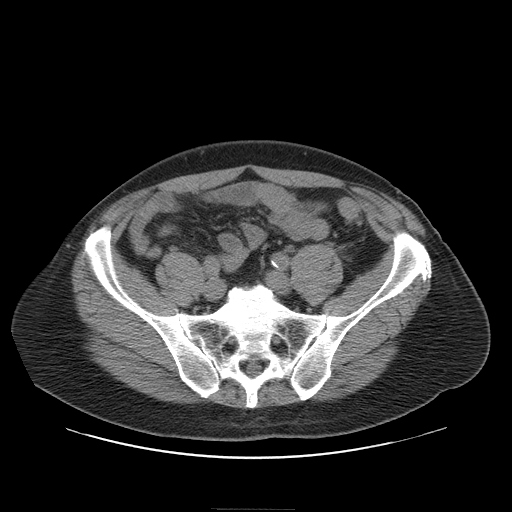
[im 40/95  soft-tissue]
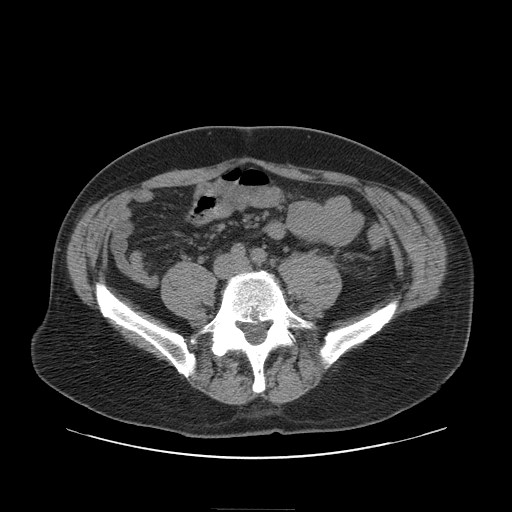
[im 44/95  soft-tissue]
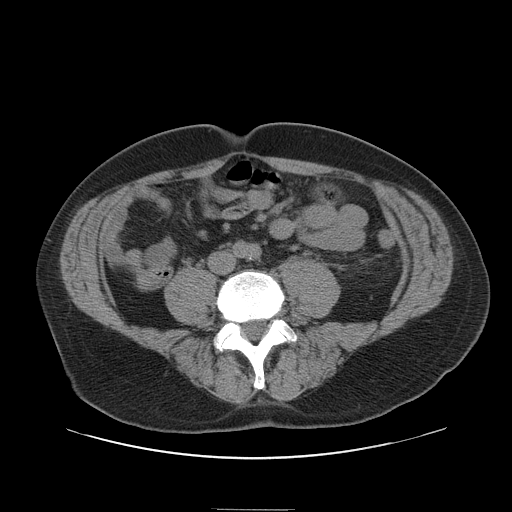
[im 51/95  soft-tissue]
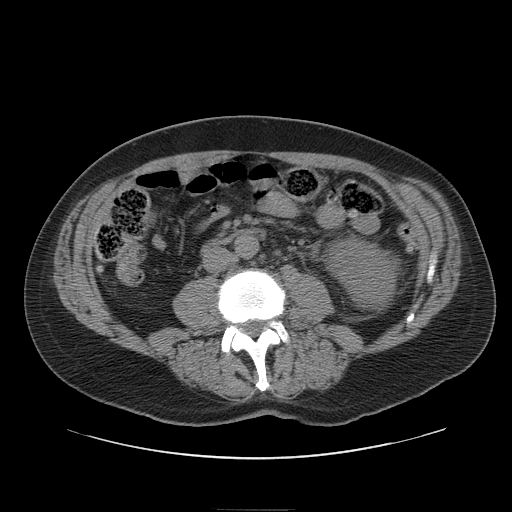
[im 55/95  soft-tissue]
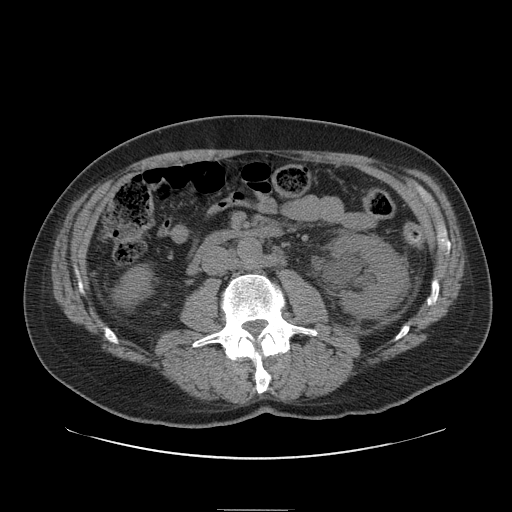
[im 55/95  bone]
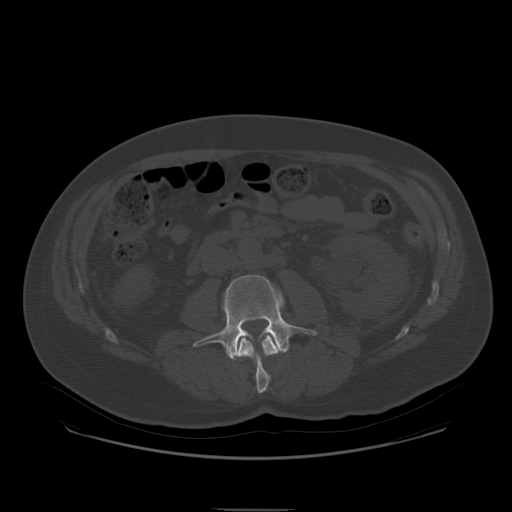
[im 63/95  soft-tissue]
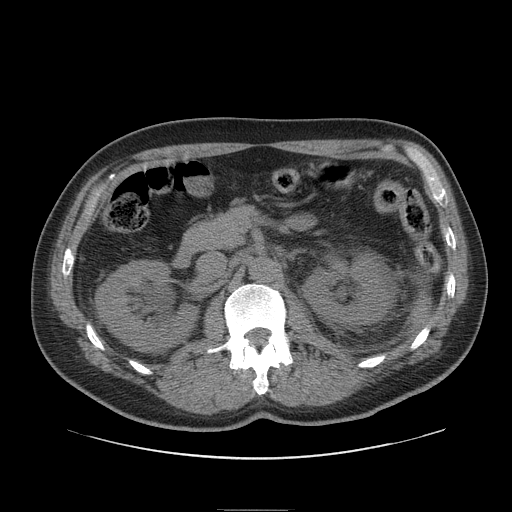
[im 71/95  soft-tissue]
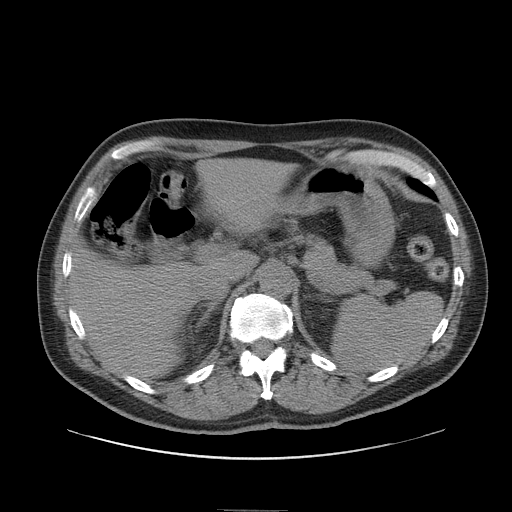
[im 75/95  soft-tissue]
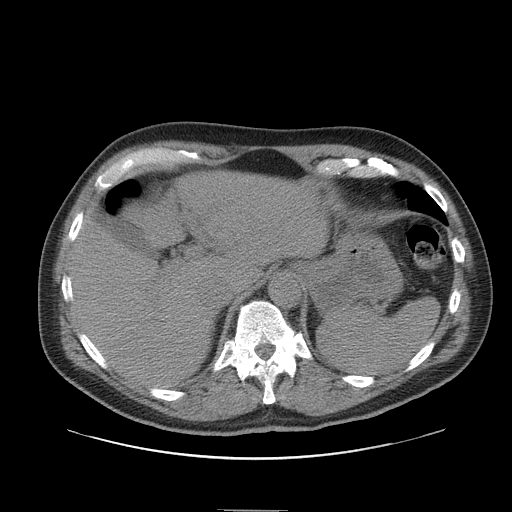
[im 83/95  soft-tissue]
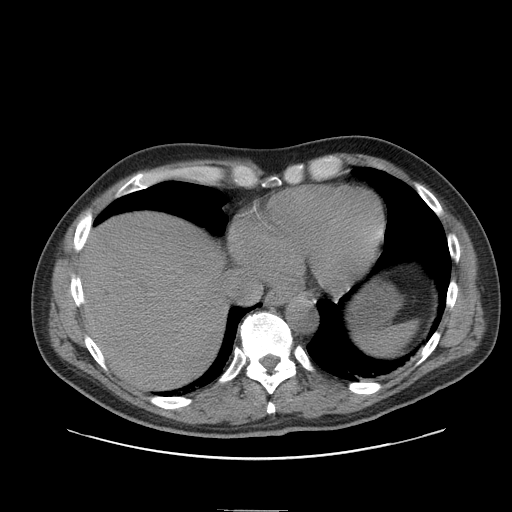
[im 91/95  soft-tissue]
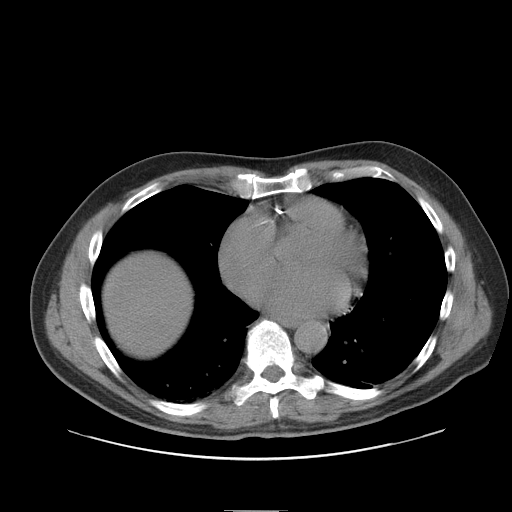

[Series 400: cor · coronal · 1.01mm/px · 3 of 121 slices shown]
[im 41/121  soft-tissue]
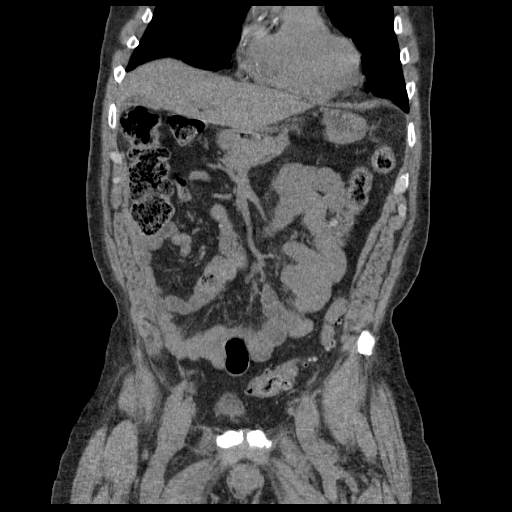
[im 54/121  soft-tissue]
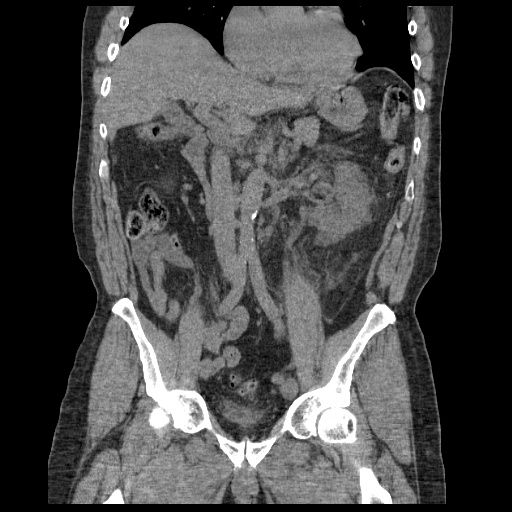
[im 67/121  soft-tissue]
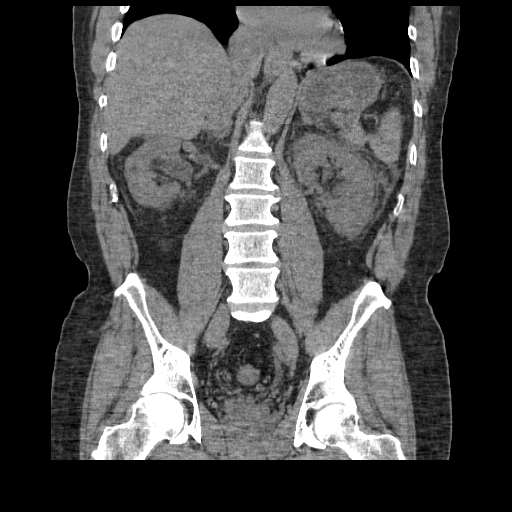

[17 of 46 positions shown; findings below may reference images not displayed]

FINDINGS: Linear atelectasis or scarring dependently in the lung
bases.  Coronary artery calcifications present.  No effusions.
Heart is normal size.

Punctate 2 mm stone at the left ureteral vesicle junction with
moderate left hydronephrosis and perinephric stranding.  Suspect
parapelvic cysts in the right kidney.  No stones or hydronephrosis
on the right.

Liver, gallbladder, spleen, pancreas, adrenals are unremarkable.

Descending colonic and sigmoid diverticula.  No active
diverticulitis.  Normal retrocecal appendix.  Small bowel is
decompressed. Incidentally noted is a circumaortic left renal vein.
Aorta is normal caliber.
IMPRESSION: Punctate 2 mm left UVJ stone with moderate left hydronephrosis and
perinephric stranding.

## 2013-08-30 ENCOUNTER — Encounter: Payer: Self-pay | Admitting: Cardiology

## 2013-08-31 ENCOUNTER — Ambulatory Visit (INDEPENDENT_AMBULATORY_CARE_PROVIDER_SITE_OTHER): Payer: Medicare Other | Admitting: Cardiology

## 2013-08-31 ENCOUNTER — Encounter: Payer: Self-pay | Admitting: Cardiology

## 2013-08-31 VITALS — BP 108/64 | HR 59 | Ht 73.0 in | Wt 218.0 lb

## 2013-08-31 DIAGNOSIS — I4891 Unspecified atrial fibrillation: Secondary | ICD-10-CM

## 2013-08-31 NOTE — Patient Instructions (Signed)

## 2013-08-31 NOTE — Progress Notes (Signed)
      HPI: 67 year old male previously followed by Dr. Riley Kill for followup of atrial fibrillation. Exercise treadmill in 2007 negative. Since he was last seen he denies dyspnea, chest pain, palpitations or syncope.  Current Outpatient Prescriptions  Medication Sig Dispense Refill  . aspirin 325 MG tablet Take 325 mg by mouth daily.       Marland Kitchen diltiazem (CARDIZEM CD) 120 MG 24 hr capsule       . doxycycline (VIBRAMYCIN) 100 MG capsule Take 100 mg by mouth daily.       . metoprolol (LOPRESSOR) 50 MG tablet Take 50 mg by mouth every evening.       . naproxen sodium (ANAPROX) 220 MG tablet Take 220 mg by mouth as needed.       No current facility-administered medications for this visit.     Past Medical History  Diagnosis Date  . PONV (postoperative nausea and vomiting)   . History of kidney stones 10-23-2011  SPONTANEOUSLY PASSED  . History of migraine   . Mild acid reflux OCCASIONAL-  WATCHES DIET  . Hyperthyroidism NO MEDS OR TX    MONITORED BY DR Evlyn Kanner  . Atrial fibrillation     Past Surgical History  Procedure Laterality Date  . Elbow surgery      right  . Transanal excision of villous adenoma of the rectum  09-12-2010  DR INGRAM    LEFT LATERAL WALL  . Cardioversion  10-15-2007  DR Riley Kill    NEW ON-SET A-FIB  . Right elbow surgery  1992 (APPROX)    TENDINITIS  . Vasectomy reversal  1990  (APPROX)  . Knee arthroscopy with lateral menisectomy  08/07/2012    Procedure: KNEE ARTHROSCOPY WITH LATERAL MENISECTOMY;  Surgeon: Drucilla Schmidt, MD;  Location: [facility];  Service: Orthopedics;  Laterality: Left;  Left Knee Arthroscopy with Partial Lateral Menisectomy     History   Social History  . Marital Status: Married    Spouse Name: N/A    Number of Children: 4  . Years of Education: N/A   Occupational History  . retirement Programmer, systems    Social History Main Topics  . Smoking status: Never Smoker   . Smokeless tobacco: Never Used  . Alcohol Use: 0.0  oz/week    3-4 Glasses of wine per week     Comment: occasional  . Drug Use: No  . Sexual Activity: Not on file   Other Topics Concern  . 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 He does not use cigarettes or recreational drugs. He does use alcohol occasionally.    ROS: no fevers or chills, productive cough, hemoptysis, dysphasia, odynophagia, melena, hematochezia, dysuria, hematuria, rash, seizure activity, orthopnea, PND, pedal edema, claudication. Remaining systems are negative.  Physical Exam: Well-developed well-nourished in no acute distress.  Skin is warm and dry.  HEENT is normal.  Neck is supple.  Chest is clear to auscultation with normal expansion.  Cardiovascular exam is irregular Abdominal exam nontender or distended. No masses palpated. Extremities show no edema. neuro grossly intact  ECG atrial fibrillation at a rate of 59. No ST changes.

## 2013-08-31 NOTE — Assessment & Plan Note (Addendum)
Patient has permanent atrial fibrillation. Continue Cardizem and metoprolol for rate control. His CHADS2 score is 0. Continue aspirin. We discussed options for anticoagulation today and I feel that aspirin is appropriate. Repeat echo.

## 2013-09-20 ENCOUNTER — Ambulatory Visit (HOSPITAL_COMMUNITY): Payer: Medicare Other | Attending: Cardiology | Admitting: Cardiology

## 2013-09-20 ENCOUNTER — Encounter: Payer: Self-pay | Admitting: Cardiology

## 2013-09-20 DIAGNOSIS — I079 Rheumatic tricuspid valve disease, unspecified: Secondary | ICD-10-CM | POA: Insufficient documentation

## 2013-09-20 DIAGNOSIS — I4891 Unspecified atrial fibrillation: Secondary | ICD-10-CM | POA: Diagnosis present

## 2013-09-20 DIAGNOSIS — R079 Chest pain, unspecified: Secondary | ICD-10-CM

## 2013-09-20 NOTE — Progress Notes (Signed)
Echo performed. 

## 2013-09-22 ENCOUNTER — Telehealth: Payer: Self-pay | Admitting: Cardiology

## 2013-09-22 NOTE — Telephone Encounter (Signed)
Follow Up  ° °Pt returned call//SR  °

## 2013-09-22 NOTE — Telephone Encounter (Signed)
New Problem: ° °Pt is calling for test results. °

## 2013-09-22 NOTE — Telephone Encounter (Signed)
Pt aware of echo results and MD's recommendations. 

## 2013-09-22 NOTE — Telephone Encounter (Signed)
Returned call to patient no answer.LMTC. 

## 2014-07-25 ENCOUNTER — Encounter: Payer: Self-pay | Admitting: Gastroenterology

## 2014-09-05 ENCOUNTER — Encounter: Payer: Self-pay | Admitting: Cardiology

## 2014-09-05 ENCOUNTER — Ambulatory Visit (INDEPENDENT_AMBULATORY_CARE_PROVIDER_SITE_OTHER): Payer: Medicare Other | Admitting: Cardiology

## 2014-09-05 DIAGNOSIS — I4891 Unspecified atrial fibrillation: Secondary | ICD-10-CM

## 2014-09-05 LAB — CBC
HCT: 46.4 % (ref 39.0–52.0)
HEMOGLOBIN: 16.1 g/dL (ref 13.0–17.0)
MCH: 31.7 pg (ref 26.0–34.0)
MCHC: 34.7 g/dL (ref 30.0–36.0)
MCV: 91.3 fL (ref 78.0–100.0)
MPV: 9.9 fL (ref 9.4–12.4)
Platelets: 212 10*3/uL (ref 150–400)
RBC: 5.08 MIL/uL (ref 4.22–5.81)
RDW: 14.3 % (ref 11.5–15.5)
WBC: 6.5 10*3/uL (ref 4.0–10.5)

## 2014-09-05 LAB — BASIC METABOLIC PANEL WITH GFR
BUN: 19 mg/dL (ref 6–23)
CO2: 28 mEq/L (ref 19–32)
Calcium: 9.5 mg/dL (ref 8.4–10.5)
Chloride: 105 mEq/L (ref 96–112)
Creat: 1.09 mg/dL (ref 0.50–1.35)
GFR, EST NON AFRICAN AMERICAN: 70 mL/min
GFR, Est African American: 81 mL/min
GLUCOSE: 99 mg/dL (ref 70–99)
POTASSIUM: 4.8 meq/L (ref 3.5–5.3)
SODIUM: 139 meq/L (ref 135–145)

## 2014-09-05 MED ORDER — APIXABAN 5 MG PO TABS: 5.0000 mg | ORAL_TABLET | Freq: Two times a day (BID) | ORAL | Status: DC

## 2014-09-05 NOTE — Assessment & Plan Note (Signed)
Patient remains in atrial fibrillation. His heart rate is decreased. Continue Cardizem but discontinue metoprolol. Plan 24-hour Holter monitor in 4 weeks to make sure that rate is adequately controlled. He has embolic risk factor of age greater than 25. There is calcium noted on previous CT scan in his coronary arteries. His Chadsvasc is therefore either 1 or 2. I discussed options of anticoagulation today. I have recommended discontinuing aspirin and beginning apixaban 5 BID. I also explained the risk of bleeding. He understands and is in agreement with present plan. Check hemoglobin and renal function today and again in 4 weeks. Follow-up 6 months.

## 2014-09-05 NOTE — Patient Instructions (Signed)
Your physician wants you to follow-up in: [place] will receive a reminder letter in the mail two months in advance. If you don't receive a letter, please call our office to schedule the follow-up appointment.   STOP ASPIRIN  STOP METOPROLOL  START ELIQUIS 5 MG ONE TABLET TWICE DAILY  Your physician recommends that you HAVE LAB WORK TODAY  Your physician recommends that you return for lab work in: [place] has recommended that you wear a 24 HOUR holter monitor. Holter monitors are medical devices that record the heart's electrical activity. Doctors most often use these monitors to diagnose arrhythmias. Arrhythmias are problems with the speed or rhythm of the heartbeat. The monitor is a small, portable device. You can wear one while you do your normal daily activities. This is usually used to diagnose what is causing palpitations/syncope (passing out).SCHEDULE AT [facility]

## 2014-09-05 NOTE — Progress Notes (Signed)
      HPI: FU atrial fibrillation. Exercise treadmill in 2007 negative. Abdominal CT January 2013 showed coronary artery calcification but no abdominal aortic aneurysm. Echocardiogram December 2014 showed normal LV function and mild left atrial enlargement. Since he was last seen he denies dyspnea, chest pain, palpitations or syncope.  Current Outpatient Prescriptions  Medication Sig Dispense Refill  . aspirin 325 MG tablet Take 325 mg by mouth daily.     Marland Kitchen diltiazem (CARDIZEM CD) 120 MG 24 hr capsule     . doxycycline (VIBRAMYCIN) 100 MG capsule Take 100 mg by mouth daily.     . metoprolol (LOPRESSOR) 50 MG tablet Take 50 mg by mouth every evening.      No current facility-administered medications for this visit.     Past Medical History  Diagnosis Date  . PONV (postoperative nausea and vomiting)   . History of kidney stones 10-23-2011  SPONTANEOUSLY PASSED  . History of migraine   . Mild acid reflux OCCASIONAL-  WATCHES DIET  . Hyperthyroidism NO MEDS OR [place]    MONITORED BY DR Forde Dandy  . Atrial fibrillation     Past Surgical History  Procedure Laterality Date  . Elbow surgery      right  . Transanal excision of villous adenoma of the rectum  09-12-2010  DR INGRAM    LEFT LATERAL WALL  . Cardioversion  10-15-2007  DR Lia Foyer    NEW ON-SET A-FIB  . Right elbow surgery  1992 (APPROX)    TENDINITIS  . Vasectomy reversal  1990  (APPROX)  . Knee arthroscopy with lateral menisectomy  08/07/2012    Procedure: KNEE ARTHROSCOPY WITH LATERAL MENISECTOMY;  Surgeon: Magnus Sinning, MD;  Location: [facility];  Service: Orthopedics;  Laterality: Left;  Left Knee Arthroscopy with Partial Lateral Menisectomy     History   Social History  . Marital Status: Married    Spouse Name: N/A    Number of Children: 4  . Years of Education: N/A   Occupational History  . retirement Teacher, music    Social History Main Topics  . Smoking status: Never Smoker   . Smokeless tobacco:  Never Used  . Alcohol Use: 0.0 oz/week    3-4 Glasses of wine per week     Comment: occasional  . Drug Use: No  . Sexual Activity: Not on file   Other Topics Concern  . 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 He does not use cigarettes or recreational drugs. He does use alcohol occasionally.    ROS: no fevers or chills, productive cough, hemoptysis, dysphasia, odynophagia, melena, hematochezia, dysuria, hematuria, rash, seizure activity, orthopnea, PND, pedal edema, claudication. Remaining systems are negative.  Physical Exam: Well-developed well-nourished in no acute distress.  Skin is warm and dry.  HEENT is normal.  Neck is supple.  Chest is clear to auscultation with normal expansion.  Cardiovascular exam is bradycardic and irregular Abdominal exam nontender or distended. No masses palpated. Extremities show no edema. neuro grossly intact  ECG atrial fibrillation with a slow ventricular response at 43. No ST changes.

## 2014-10-04 ENCOUNTER — Encounter: Payer: Self-pay | Admitting: *Deleted

## 2014-10-04 ENCOUNTER — Encounter (INDEPENDENT_AMBULATORY_CARE_PROVIDER_SITE_OTHER): Payer: Medicare Other

## 2014-10-04 DIAGNOSIS — I4891 Unspecified atrial fibrillation: Secondary | ICD-10-CM

## 2014-10-04 NOTE — Progress Notes (Signed)
Patient ID: Kyle [name], male   DOB: 07-Oct-1945, 69 y.o.   MRN: [id number] [employer] 24 hour holter monitor applied to patient.

## 2014-10-17 ENCOUNTER — Other Ambulatory Visit: Payer: Self-pay | Admitting: Dermatology

## 2014-10-17 ENCOUNTER — Telehealth: Payer: Self-pay | Admitting: *Deleted

## 2014-10-17 NOTE — Telephone Encounter (Signed)
Monitor reviewed by dr Stanford Breed shows atrial fib with controlled rate. Left message for pt to call

## 2014-10-18 ENCOUNTER — Telehealth: Payer: Self-pay | Admitting: Cardiology

## 2014-10-18 NOTE — Telephone Encounter (Signed)
Pt called in stating that Hilda Blades called in him yesterday about some results to his 24 monitor. Please f/u with pt  Thanks

## 2014-10-18 NOTE — Telephone Encounter (Signed)
Reported results to patient, he voiced understanding.

## 2014-10-21 LAB — BASIC METABOLIC PANEL WITH GFR
BUN: 18 mg/dL (ref 6–23)
CHLORIDE: 104 meq/L (ref 96–112)
CO2: 30 mEq/L (ref 19–32)
Calcium: 9.2 mg/dL (ref 8.4–10.5)
Creat: 0.99 mg/dL (ref 0.50–1.35)
GFR, EST NON AFRICAN AMERICAN: 78 mL/min
GFR, Est African American: >89 mL/min
GLUCOSE: 92 mg/dL (ref 70–99)
Potassium: 4.6 mEq/L (ref 3.5–5.3)
Sodium: 142 mEq/L (ref 135–145)

## 2014-10-21 LAB — CBC
HEMATOCRIT: 47.5 % (ref 39.0–52.0)
Hemoglobin: 16.4 g/dL (ref 13.0–17.0)
MCH: 31.8 pg (ref 26.0–34.0)
MCHC: 34.5 g/dL (ref 30.0–36.0)
MCV: 92.1 fL (ref 78.0–100.0)
MPV: 9.8 fL (ref 9.4–12.4)
PLATELETS: 206 10*3/uL (ref 150–400)
RBC: 5.16 MIL/uL (ref 4.22–5.81)
RDW: 13.6 % (ref 11.5–15.5)
WBC: 5.9 10*3/uL (ref 4.0–10.5)

## 2014-12-19 ENCOUNTER — Telehealth: Payer: Self-pay | Admitting: Cardiology

## 2014-12-19 DIAGNOSIS — I4891 Unspecified atrial fibrillation: Secondary | ICD-10-CM

## 2014-12-19 MED ORDER — APIXABAN 5 MG PO TABS: 5.0000 mg | ORAL_TABLET | Freq: Two times a day (BID) | ORAL | Status: DC

## 2014-12-19 NOTE — Telephone Encounter (Signed)
Rx(s) sent to pharmacy electronically.  

## 2014-12-19 NOTE — Telephone Encounter (Signed)
Patient needs refill on Eliquis.  Called his drugstore and was told needed new rx from Dr. Stanford Breed.  [facility]. Due to be seen in July.

## 2015-04-21 NOTE — Progress Notes (Signed)
      HPI: FU atrial fibrillation. Exercise treadmill in 2007 negative. Abdominal CT January 2013 showed coronary artery calcification but no abdominal aortic aneurysm. Echocardiogram December 2014 showed normal LV function and mild left atrial enlargement. Holter monitor 1/16 showed rate controlled atrial fibrillation. Since he was last seen the patient denies any dyspnea on exertion, orthopnea, PND, pedal edema, palpitations, syncope or chest pain. No bleeding   Current Outpatient Prescriptions  Medication Sig Dispense Refill  . apixaban (ELIQUIS) 5 MG TABS tablet Take 1 tablet (5 mg total) by mouth 2 (two) times daily. 180 tablet 1  . diltiazem (CARDIZEM CD) 120 MG 24 hr capsule Take 120 mg by mouth daily.     Marland Kitchen doxycycline (VIBRAMYCIN) 100 MG capsule Take 100 mg by mouth daily.      No current facility-administered medications for this visit.     Past Medical History  Diagnosis Date  . PONV (postoperative nausea and vomiting)   . History of kidney stones 10-23-2011  SPONTANEOUSLY PASSED  . History of migraine   . Mild acid reflux OCCASIONAL-  WATCHES DIET  . Hyperthyroidism NO MEDS OR [place]    MONITORED BY DR Forde Dandy  . Atrial fibrillation     Past Surgical History  Procedure Laterality Date  . Elbow surgery      right  . Transanal excision of villous adenoma of the rectum  09-12-2010  DR INGRAM    LEFT LATERAL WALL  . Cardioversion  10-15-2007  DR Lia Foyer    NEW ON-SET A-FIB  . Right elbow surgery  1992 (APPROX)    TENDINITIS  . Vasectomy reversal  1990  (APPROX)  . Knee arthroscopy with lateral menisectomy  08/07/2012    Procedure: KNEE ARTHROSCOPY WITH LATERAL MENISECTOMY;  Surgeon: Magnus Sinning, MD;  Location: [facility];  Service: Orthopedics;  Laterality: Left;  Left Knee Arthroscopy with Partial Lateral Menisectomy     History   Social History  . Marital Status: Married    Spouse Name: N/A  . Number of Children: 4  . Years of Education: N/A     Occupational History  . retirement Teacher, music    Social History Main Topics  . Smoking status: Never Smoker   . Smokeless tobacco: Never Used  . Alcohol Use: 0.0 oz/week    3-4 Glasses of wine per week     Comment: occasional  . Drug Use: No  . Sexual Activity: Not on file   Other Topics Concern  . 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 He does not use cigarettes or recreational drugs. He does use alcohol occasionally.    ROS: no fevers or chills, productive cough, hemoptysis, dysphasia, odynophagia, melena, hematochezia, dysuria, hematuria, rash, seizure activity, orthopnea, PND, pedal edema, claudication. Remaining systems are negative.  Physical Exam: Well-developed well-nourished in no acute distress.  Skin is warm and dry.  HEENT is normal.  Neck is supple.  Chest is clear to auscultation with normal expansion.  Cardiovascular exam is irregular Abdominal exam nontender or distended. No masses palpated. Extremities show no edema. neuro grossly intact  ECG atrial fibrillation at a rate of 59. Normal axis. No ST changes.

## 2015-04-24 ENCOUNTER — Encounter: Payer: Self-pay | Admitting: Cardiology

## 2015-04-24 ENCOUNTER — Ambulatory Visit (INDEPENDENT_AMBULATORY_CARE_PROVIDER_SITE_OTHER): Payer: Medicare Other | Admitting: Cardiology

## 2015-04-24 VITALS — BP 100/60 | HR 59 | Ht 73.0 in | Wt 216.2 lb

## 2015-04-24 DIAGNOSIS — I482 Chronic atrial fibrillation, unspecified: Secondary | ICD-10-CM

## 2015-04-24 DIAGNOSIS — I4891 Unspecified atrial fibrillation: Secondary | ICD-10-CM | POA: Diagnosis not present

## 2015-04-24 LAB — BASIC METABOLIC PANEL WITH GFR
BUN: 17 mg/dL (ref 7–25)
CO2: 26 mmol/L (ref 20–31)
Calcium: 9 mg/dL (ref 8.6–10.3)
Chloride: 104 mmol/L (ref 98–110)
Creat: 1.06 mg/dL (ref 0.70–1.25)
GFR, Est African American: 84 mL/min (ref >=60)
GFR, Est Non African American: 72 mL/min (ref >=60)
GLUCOSE: 97 mg/dL (ref 65–99)
Potassium: 4.5 mmol/L (ref 3.5–5.3)
Sodium: 141 mmol/L (ref 135–146)

## 2015-04-24 LAB — CBC
HCT: 47.2 % (ref 39.0–52.0)
Hemoglobin: 16.3 g/dL (ref 13.0–17.0)
MCH: 31.9 pg (ref 26.0–34.0)
MCHC: 34.5 g/dL (ref 30.0–36.0)
MCV: 92.4 fL (ref 78.0–100.0)
MPV: 9.7 fL (ref 8.6–12.4)
PLATELETS: 192 10*3/uL (ref 150–400)
RBC: 5.11 MIL/uL (ref 4.22–5.81)
RDW: 14 % (ref 11.5–15.5)
WBC: 4.5 10*3/uL (ref 4.0–10.5)

## 2015-04-24 MED ORDER — DILTIAZEM HCL ER COATED BEADS 120 MG PO CP24: 120.0000 mg | ORAL_CAPSULE | Freq: Every day | ORAL | Status: DC

## 2015-04-24 MED ORDER — APIXABAN 5 MG PO TABS: 5.0000 mg | ORAL_TABLET | Freq: Two times a day (BID) | ORAL | Status: DC

## 2015-04-24 NOTE — Patient Instructions (Signed)
Your physician wants you to follow-up in: [place] will receive a reminder letter in the mail two months in advance. If you don't receive a letter, please call our office to schedule the follow-up appointment.   Your physician recommends that you HAVE LAB WORK TODAY AND EVERY 6 MONTHS

## 2015-04-24 NOTE — Assessment & Plan Note (Signed)
Patient remains in permanent atrial fibrillation. Continue Cardizem for rate control. Continue apixaban. Check hemoglobin and renal function.

## 2015-07-06 ENCOUNTER — Other Ambulatory Visit: Payer: Self-pay | Admitting: Cardiology

## 2015-11-15 ENCOUNTER — Other Ambulatory Visit: Payer: Self-pay | Admitting: *Deleted

## 2015-11-15 DIAGNOSIS — I482 Chronic atrial fibrillation, unspecified: Secondary | ICD-10-CM

## 2015-11-27 LAB — CBC
HEMATOCRIT: 46.6 % (ref 39.0–52.0)
HEMOGLOBIN: 15.4 g/dL (ref 13.0–17.0)
MCH: 30.6 pg (ref 26.0–34.0)
MCHC: 33 g/dL (ref 30.0–36.0)
MCV: 92.6 fL (ref 78.0–100.0)
MPV: 9.7 fL (ref 8.6–12.4)
Platelets: 294 10*3/uL (ref 150–400)
RBC: 5.03 MIL/uL (ref 4.22–5.81)
RDW: 13.8 % (ref 11.5–15.5)
WBC: 6.7 10*3/uL (ref 4.0–10.5)

## 2015-11-28 LAB — BASIC METABOLIC PANEL
BUN: 14 mg/dL (ref 7–25)
CALCIUM: 8.9 mg/dL (ref 8.6–10.3)
CO2: 30 mmol/L (ref 20–31)
CREATININE: 0.94 mg/dL (ref 0.70–1.25)
Chloride: 103 mmol/L (ref 98–110)
GLUCOSE: 76 mg/dL (ref 65–99)
Potassium: 4.2 mmol/L (ref 3.5–5.3)
Sodium: 144 mmol/L (ref 135–146)

## 2016-02-14 ENCOUNTER — Encounter: Payer: Self-pay | Admitting: Cardiology

## 2016-02-14 NOTE — Telephone Encounter (Signed)
Please call,pt have called twice today,said this was his 4th call since yesterday.I sent his previous message today as a staff message.Thats the only other message I know about.

## 2016-02-14 NOTE — Telephone Encounter (Signed)
This encounter was created in error - please disregard.

## 2016-02-22 ENCOUNTER — Encounter: Payer: Self-pay | Admitting: Physician Assistant

## 2016-02-22 ENCOUNTER — Ambulatory Visit (INDEPENDENT_AMBULATORY_CARE_PROVIDER_SITE_OTHER): Payer: Medicare Other | Admitting: Physician Assistant

## 2016-02-22 VITALS — BP 115/74 | HR 61 | Ht 72.0 in | Wt 214.6 lb

## 2016-02-22 DIAGNOSIS — Z01810 Encounter for preprocedural cardiovascular examination: Secondary | ICD-10-CM

## 2016-02-22 DIAGNOSIS — I482 Chronic atrial fibrillation: Secondary | ICD-10-CM

## 2016-02-22 DIAGNOSIS — I4821 Permanent atrial fibrillation: Secondary | ICD-10-CM

## 2016-02-22 DIAGNOSIS — Z7901 Long term (current) use of anticoagulants: Secondary | ICD-10-CM | POA: Diagnosis not present

## 2016-02-22 NOTE — Patient Instructions (Signed)
It is okay for you to stop Eliquis for a very short term in order to have a colonoscopy and knee surgery.  Rosaria Ferries, PA-C, recommends that you schedule a follow-up appointment in 1 year with Dr Stanford Breed or sooner if needed. You will receive a reminder letter in the mail two months in advance. If you don't receive a letter, please call our office to schedule the follow-up appointment.  Compression Stockings Compression stockings are elastic stockings that "compress" your legs. This helps to increase blood flow, decrease swelling, and reduces the chance of getting blood clots in your lower legs. Compression stockings are used:  After surgery.  If you have a history of poor circulation.  If you are prone to blood clots.  If you have varicose veins.  If you sit or are bedridden for long periods of time. WEARING COMPRESSION STOCKINGS  Your compression stockings should be worn as instructed by your caregiver.  Wearing the correct stocking size is important. Your caregiver can help measure and fit you to the correct size.  When wearing your stockings, do not allow the stockings to bunch up. This is especially important around your toes or behind your knees. Keep the stockings as smooth as possible.  Do not roll the stockings downward and leave them rolled down. This can form a restrictive band around your legs and can decrease blood flow.  The stockings should be removed once a day for 1 hour or as instructed by your caregiver. When the stockings are taken off, inspect your legs and feet. Look for:  Open sores.  Red spots.  Puffy areas (swelling).  Anything that does not seem normal. IMPORTANT INFORMATION ABOUT COMPRESSION STOCKINGS  The compression stockings should be clean, dry, and in good condition before you put them on.  Do not put lotion on your legs or feet. This makes it harder to put the stockings on.  Change your stockings immediately if they become wet or  soiled.  Do not wear stockings that are ripped or torn.  You may hand-wash or put your stockings in the washing machine. Use cold or warm water with mild detergent. Do not bleach your stockings. They may be air-dried or dried in the dryer on low heat.  If you have pain or have a feeling of "pins and needles" in your feet or legs, you may be wearing stockings that are too tight. Call your caregiver right away. SEEK IMMEDIATE MEDICAL CARE IF:   You have numbness or tingling in your lower legs that does not get better quickly after the stockings are removed.  Your toes or feet become cold and blue.  You develop open sores or have red spots on your legs that do not go away. MAKE SURE YOU:   Understand these instructions.  Will watch your condition.  Will get help right away if you are not doing well or get worse. Document Released: 07/07/2009 Document Revised: 12/02/2011 Document Reviewed: 07/07/2009 [facility] Patient Information 2014 [place], [place].

## 2016-02-22 NOTE — Progress Notes (Signed)
Cardiology Office Note   Date:  02/22/2016   ID:  Kyle [name] 1946-05-17, MRN [id number]  PCP:  Kyle Shire, MD  Cardiologist:  Kyle [name], last office visit 04/2015 Kyle Ferries, PA-C   No chief complaint on file.   History of Present Illness: Kyle [name] is a 70 y.o. male with a history of permanent afib, on Cardizem and Eliquis, hyperthyroid, GERD, nl EF by echo 2014.  Kyle [name] presents for yearly follow-up and preoperative evaluation.  Kyle [name] never gets chest pain. He needs a colonoscopy and he is having significant right knee problems, needing arthroscopic surgery. For each of these procedures, he will need to hold his Eliquis and was noted that is okay. Additionally, he needs cardiac clearance for the knee surgery and a form has been sent over for this as well.  Kyle [name] has no ischemic symptoms. He was playing tennis until his knee got bad. He is still able to climb stairs without getting chest pain or shortness of breath. He rarely has palpitations and is only aware that he is in atrial fibrillation a couple of times a week. He has some mild lower extremity daytime edema. He denies orthopnea or PND. His weight has not changed recently. He has not had any recent dyspnea on exertion.   Past Medical History  Diagnosis Date  . PONV (postoperative nausea and vomiting)   . History of kidney stones 10-23-2011  SPONTANEOUSLY PASSED  . History of migraine   . Mild acid reflux OCCASIONAL-  WATCHES DIET  . Hyperthyroidism NO MEDS OR [place]    MONITORED BY Kyle [name]  . Atrial fibrillation [facility])     Past Surgical History  Procedure Laterality Date  . Elbow surgery      right  . Transanal excision of villous adenoma of the rectum  09-12-2010  Kyle [name]    LEFT LATERAL WALL  . Cardioversion  10-15-2007  Kyle [name]    NEW ON-SET A-FIB  . Right elbow surgery  1992 (APPROX)    TENDINITIS  . Vasectomy reversal  1990  (APPROX)  . Knee arthroscopy with lateral  menisectomy  08/07/2012    Procedure: KNEE ARTHROSCOPY WITH LATERAL MENISECTOMY;  Surgeon: Kyle Sinning, MD;  Location: [name];  Service: [name];  Laterality: Left;  Left Knee Arthroscopy with Partial Lateral Menisectomy     Current Outpatient Prescriptions  Medication Sig Dispense Refill  . apixaban (ELIQUIS) 5 MG TABS tablet Take 1 tablet (5 mg total) by mouth 2 (two) times daily. 180 tablet 3  . diltiazem (CARDIZEM CD) 120 MG 24 hr capsule Take 1 capsule (120 mg total) by mouth daily. 90 capsule 3  . doxycycline (VIBRAMYCIN) 100 MG capsule Take 100 mg by mouth daily.      No current facility-administered medications for this visit.    Allergies:   Ampicillin    Social History:  The patient  reports that he has never smoked. He has never used smokeless tobacco. He reports that he drinks alcohol. He reports that he does not use illicit drugs.   Family History:  The patient's family history includes Stroke in his mother.    ROS:  Please see the history of present illness. All other systems are reviewed and negative.    PHYSICAL EXAM: VS:  BP 115/74 mmHg  Pulse 61  Ht 6' (1.829 m)  Wt 214 lb 9.6 oz (97.342 kg)  BMI 29.10 kg/m2 , BMI Body mass  index is 29.1 kg/(m^2). GEN: Well nourished, well developed, male in no acute distress HEENT: normal for age  Neck: no JVD, no carotid bruit, no masses Cardiac: Irregular rate and rhythm; soft murmur, no rubs, or gallops Respiratory:  clear to auscultation bilaterally, normal work of breathing GI: soft, nontender, nondistended, + BS MS: no deformity or atrophy; 1+ edema; distal pulses are 2+ in all 4 extremities  Skin: warm and dry, no rash Neuro:  Strength and sensation are intact Psych: euthymic mood, full affect   EKG:  EKG is ordered today. The ekg ordered today demonstrates atrial fibrillation, no acute ischemic changes   Recent Labs: 11/27/2015: BUN 14; Creat 0.94; Hemoglobin 15.4; Platelets 294;  Potassium 4.2; Sodium 144    Lipid Panel    Component Value Date/Time   CHOL 154 12/05/2006 0845   TRIG 57 12/05/2006 0845   HDL 33.3* 12/05/2006 0845   CHOLHDL 4.6 CALC 12/05/2006 0845   VLDL 11 12/05/2006 0845   LDLCALC 109* 12/05/2006 0845     Wt Readings from Last 3 Encounters:  02/22/16 214 lb 9.6 oz (97.342 kg)  04/24/15 216 lb 3.2 oz (98.068 kg)  09/05/14 217 lb 12.8 oz (98.793 kg)     Other studies Reviewed: Additional studies/ records that were reviewed today include: Previous office notes and testing.  ASSESSMENT AND PLAN:  1.  Permanent atrial fibrillation: His rate is well controlled on current therapy, no changes.  2. Chronic anticoagulation: His CHADS2VASC=1 for age. He is on Eliquis and is compliant with this. He is at acceptable risk to hold the Eliquis as needed for the surgeries.  3. Lower extremity edema: He has no significant volume overload, only mild daytime lower extremity edema. His weight is not trending up. He has no dyspnea on exertion, no JVD. Lungs are clear. He is encouraged to get over-the-counter compression stockings and to wear them. He works and sits with his legs down or is walking most the day. He has no real option for elevating his legs during the day. He is encouraged to watch the sodium in his diet.  4. Preoperative evaluation: He has no ongoing ischemic symptoms. He has never required revascularization. His activity is limited by his musculoskeletal problems, but he has no significant dyspnea on exertion and has never had chest pain. He is at acceptable risk for the planned procedures with no further preoperative testing needed.   Current medicines are reviewed at length with the patient today.  The patient does not have concerns regarding medicines.  The following changes have been made:  no change  Labs/ tests ordered today include:  No orders of the defined types were placed in this encounter.     Disposition:   FU with Kyle.  Stanford [name] as scheduled  Signed, Kyle [name]  02/22/2016 3:21 PM    Kyle [name]: [phone]; Fax: [phone]  This note was written with the assistance of speech recognition software. Please excuse any transcriptional errors.

## 2016-02-27 ENCOUNTER — Other Ambulatory Visit: Payer: Self-pay | Admitting: Gastroenterology

## 2016-03-25 ENCOUNTER — Other Ambulatory Visit: Payer: Self-pay | Admitting: Cardiology

## 2016-04-25 ENCOUNTER — Telehealth: Payer: Self-pay | Admitting: Cardiology

## 2016-04-25 DIAGNOSIS — I4891 Unspecified atrial fibrillation: Secondary | ICD-10-CM

## 2016-04-25 DIAGNOSIS — I482 Chronic atrial fibrillation, unspecified: Secondary | ICD-10-CM

## 2016-04-25 MED ORDER — APIXABAN 5 MG PO TABS: 5.0000 mg | ORAL_TABLET | Freq: Two times a day (BID) | ORAL | 3 refills | Status: DC

## 2016-04-25 NOTE — Telephone Encounter (Signed)
rx refill sent electronically.

## 2016-04-25 NOTE — Telephone Encounter (Signed)
New Message   *STAT* If patient is at the pharmacy, call can be transferred to refill team.   1. Which medications need to be refilled? (please list name of each medication and dose if known) Eliquis  2. Which pharmacy/location (including street and city if local pharmacy) is medication to be sent to? [facility]  3. Do they need a 30 day or 90 day supply? 90 day  Number to [facility]

## 2016-05-01 ENCOUNTER — Other Ambulatory Visit: Payer: Self-pay | Admitting: *Deleted

## 2016-05-01 DIAGNOSIS — I4891 Unspecified atrial fibrillation: Secondary | ICD-10-CM

## 2016-05-01 DIAGNOSIS — I482 Chronic atrial fibrillation, unspecified: Secondary | ICD-10-CM

## 2016-05-01 NOTE — Telephone Encounter (Signed)
Patient called and stated that this rx was supposed to have been sent to [facility] last week. He no longer uses [facility].

## 2016-05-02 MED ORDER — APIXABAN 5 MG PO TABS: 5.0000 mg | ORAL_TABLET | Freq: Two times a day (BID) | ORAL | 1 refills | Status: DC

## 2016-05-06 ENCOUNTER — Telehealth: Payer: Self-pay | Admitting: *Deleted

## 2016-05-06 DIAGNOSIS — I482 Chronic atrial fibrillation, unspecified: Secondary | ICD-10-CM

## 2016-05-06 DIAGNOSIS — I4891 Unspecified atrial fibrillation: Secondary | ICD-10-CM

## 2016-05-06 MED ORDER — APIXABAN 5 MG PO TABS: 5.0000 mg | ORAL_TABLET | Freq: Two times a day (BID) | ORAL | 1 refills | Status: DC

## 2016-05-06 MED ORDER — DILTIAZEM HCL ER COATED BEADS 120 MG PO CP24: 120.0000 mg | ORAL_CAPSULE | Freq: Every day | ORAL | 1 refills | Status: DC

## 2016-05-06 NOTE — Telephone Encounter (Signed)
Rx(s) sent to pharmacy electronically.  

## 2016-05-06 NOTE — Telephone Encounter (Signed)
wants medication refills sent to [facility],  Eliquis & Diltiazem, per pt call, Dr Stanford Breed pt. Will route to [facility] refills

## 2016-05-09 ENCOUNTER — Telehealth: Payer: Self-pay | Admitting: *Deleted

## 2016-05-09 NOTE — Telephone Encounter (Signed)
Called [facility] and got Eliquis approved until 09/22/2016 Patient aware

## 2016-08-13 ENCOUNTER — Other Ambulatory Visit: Payer: Self-pay | Admitting: Pharmacist

## 2016-08-13 DIAGNOSIS — I482 Chronic atrial fibrillation, unspecified: Secondary | ICD-10-CM

## 2016-08-13 MED ORDER — APIXABAN 5 MG PO TABS: 5.0000 mg | ORAL_TABLET | Freq: Two times a day (BID) | ORAL | 1 refills | Status: DC

## 2016-11-13 ENCOUNTER — Telehealth: Payer: Self-pay | Admitting: *Deleted

## 2016-11-13 NOTE — Telephone Encounter (Signed)
Medication samples have been provided to the patient.  Drug name: Eliquis 5mg   Qty: 14 LOT: [id number]  Exp.Date: 3/20  Samples left at front desk for patient pick-up. Patient notified.  Kyle [name] A Kyle [name] 8:12 AM 11/13/2016

## 2016-11-25 NOTE — Progress Notes (Signed)
HPI: FU atrial fibrillation. Exercise treadmill in 2007 negative. Abdominal CT January 2013 showed coronary artery calcification but no abdominal aortic aneurysm. Echocardiogram December 2014 showed normal LV function and mild left atrial enlargement. Holter monitor 1/16 showed rate controlled atrial fibrillation. Since he was last seen patient notes mild increased dyspnea on exertion. However he has not been as active because of prior knee surgery. There is no chest pain. No syncope or bleeding. He notes occasional palpitations  Current Outpatient Prescriptions  Medication Sig Dispense Refill  . apixaban (ELIQUIS) 5 MG TABS tablet Take 1 tablet (5 mg total) by mouth 2 (two) times daily. 180 tablet 1  . diltiazem (CARDIZEM CD) 120 MG 24 hr capsule Take 1 capsule (120 mg total) by mouth daily. 90 capsule 1  . doxycycline (VIBRAMYCIN) 100 MG capsule Take 40 mg by mouth 2 (two) times daily.      No current facility-administered medications for this visit.      Past Medical History:  Diagnosis Date  . Atrial fibrillation ([place])   . History of kidney stones 10-23-2011  SPONTANEOUSLY PASSED  . History of migraine   . Hyperthyroidism NO MEDS OR [place]   MONITORED BY DR Forde Dandy  . Mild acid reflux OCCASIONAL-  WATCHES DIET  . PONV (postoperative nausea and vomiting)     Past Surgical History:  Procedure Laterality Date  . CARDIOVERSION  10-15-2007  DR Lia Foyer   NEW ON-SET A-FIB  . ELBOW SURGERY     right  . KNEE ARTHROSCOPY WITH LATERAL MENISECTOMY  08/07/2012   Procedure: KNEE ARTHROSCOPY WITH LATERAL MENISECTOMY;  Surgeon: Magnus Sinning, MD;  Location: [facility];  Service: Orthopedics;  Laterality: Left;  Left Knee Arthroscopy with Partial Lateral Menisectomy   . RIGHT ELBOW SURGERY  1992 (APPROX)   TENDINITIS  . 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 VASECTOMY REVERSAL  1990  (APPROX)    Social History    Social History  . Marital status: Married    Spouse name: N/A  . Number of children: 4  . Years of education: N/A   Occupational History  . retirement broker Hydrologist   Social History Main Topics  . Smoking status: Never Smoker  . Smokeless tobacco: Never Used  . Alcohol use 0.0 oz/week    3 - 4 Glasses of wine per week     Comment: occasional  . Drug use: No  . Sexual activity: Not on file   Other Topics Concern  . 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 He does not use cigarettes or recreational drugs. He does use alcohol occasionally.    Family History  Problem Relation Age of Onset  . Stroke Mother     ROS: no fevers or chills, productive cough, hemoptysis, dysphasia, odynophagia, melena, hematochezia, dysuria, hematuria, rash, seizure activity, orthopnea, PND, pedal edema, claudication. Remaining systems are negative.  Physical Exam: Well-developed well-nourished in no acute distress.  Skin is warm and dry.  HEENT is normal.  Neck is supple. No bruits Chest is clear to auscultation with normal expansion.  Cardiovascular exam is irregular Abdominal exam nontender or distended. No masses palpated. Extremities show no edema. neuro grossly intact  ECG- Atrial fibrillation at a rate of 62. Normal axis; personally reviewed  A/P  1 Atrial fibrillation-continue Cardizem for rate  control. Continue apixaban. Check Hgb and renal function. Patient notes some palpitations. He states his heart rate increases with exercising. I will arrange a 48 hour Holter monitor to further assess. He also notes some increased dyspnea on exertion. Schedule echocardiogram to assess LV function. Not volume overloaded on examination.   2 palpitations-plan as outlined under atrial fibrillation. Check 48-hour Holter monitor.   3 dyspnea-question component of deconditioning related to knee surgery. Schedule  echocardiogram to assess LV function.   Kirk Ruths, MD

## 2016-11-29 ENCOUNTER — Ambulatory Visit (INDEPENDENT_AMBULATORY_CARE_PROVIDER_SITE_OTHER): Payer: Medicare Other | Admitting: Cardiology

## 2016-11-29 ENCOUNTER — Encounter: Payer: Self-pay | Admitting: Cardiology

## 2016-11-29 VITALS — BP 102/70 | HR 62 | Ht 72.0 in | Wt 221.0 lb

## 2016-11-29 DIAGNOSIS — R0600 Dyspnea, unspecified: Secondary | ICD-10-CM | POA: Diagnosis not present

## 2016-11-29 DIAGNOSIS — I482 Chronic atrial fibrillation, unspecified: Secondary | ICD-10-CM

## 2016-11-29 DIAGNOSIS — R002 Palpitations: Secondary | ICD-10-CM | POA: Diagnosis not present

## 2016-11-29 DIAGNOSIS — I4891 Unspecified atrial fibrillation: Secondary | ICD-10-CM | POA: Diagnosis not present

## 2016-11-29 MED ORDER — APIXABAN 5 MG PO TABS: 5.0000 mg | ORAL_TABLET | Freq: Two times a day (BID) | ORAL | 3 refills | Status: DC

## 2016-11-29 NOTE — Patient Instructions (Signed)
Medication Instructions:   NO CHANGE  Labwork:  Your physician recommends that you HAVE LAB WORK TODAY  Testing/Procedures:  Your physician has requested that you have an echocardiogram. Echocardiography is a painless test that uses sound waves to create images of your heart. It provides your doctor with information about the size and shape of your heart and how well your heart's chambers and valves are working. This procedure takes approximately one hour. There are no restrictions for this procedure.   Your physician has recommended that you wear a 48 HOUR holter monitor. Holter monitors are medical devices that record the heart's electrical activity. Doctors most often use these monitors to diagnose arrhythmias. Arrhythmias are problems with the speed or rhythm of the heartbeat. The monitor is a small, portable device. You can wear one while you do your normal daily activities. This is usually used to diagnose what is causing palpitations/syncope (passing out).    Follow-Up:  Your physician recommends that you schedule a follow-up appointment in: [place]   If you need a refill on your cardiac medications before your next appointment, please call your pharmacy.

## 2016-12-02 LAB — CBC
HEMATOCRIT: 47.1 % (ref 38.5–50.0)
Hemoglobin: 15.7 g/dL (ref 13.2–17.1)
MCH: 31.3 pg (ref 27.0–33.0)
MCHC: 33.3 g/dL (ref 32.0–36.0)
MCV: 94 fL (ref 80.0–100.0)
MPV: 9.7 fL (ref 7.5–12.5)
Platelets: 203 10*3/uL (ref 140–400)
RBC: 5.01 MIL/uL (ref 4.20–5.80)
RDW: 14.3 % (ref 11.0–15.0)
WBC: 5 10*3/uL (ref 3.8–10.8)

## 2016-12-03 LAB — BASIC METABOLIC PANEL
BUN: 16 mg/dL (ref 7–25)
CALCIUM: 9.2 mg/dL (ref 8.6–10.3)
CO2: 28 mmol/L (ref 20–31)
Chloride: 106 mmol/L (ref 98–110)
Creat: 1.02 mg/dL (ref 0.70–1.25)
GLUCOSE: 59 mg/dL — AB (ref 65–99)
POTASSIUM: 4.3 mmol/L (ref 3.5–5.3)
SODIUM: 146 mmol/L (ref 135–146)

## 2016-12-09 ENCOUNTER — Ambulatory Visit (INDEPENDENT_AMBULATORY_CARE_PROVIDER_SITE_OTHER): Payer: Medicare Other

## 2016-12-09 DIAGNOSIS — R002 Palpitations: Secondary | ICD-10-CM

## 2016-12-12 ENCOUNTER — Telehealth: Payer: Self-pay | Admitting: Cardiology

## 2016-12-12 NOTE — Telephone Encounter (Signed)
Acknowledged.

## 2016-12-12 NOTE — Telephone Encounter (Signed)
New Message    Verdis Frederickson from [place] calling to let us know that the information from the patients monitor is coming through just fine

## 2016-12-17 ENCOUNTER — Telehealth: Payer: Self-pay | Admitting: *Deleted

## 2016-12-17 ENCOUNTER — Ambulatory Visit (HOSPITAL_COMMUNITY): Payer: Medicare Other | Attending: Cardiology

## 2016-12-17 ENCOUNTER — Other Ambulatory Visit: Payer: Self-pay

## 2016-12-17 DIAGNOSIS — I517 Cardiomegaly: Secondary | ICD-10-CM | POA: Insufficient documentation

## 2016-12-17 DIAGNOSIS — I071 Rheumatic tricuspid insufficiency: Secondary | ICD-10-CM | POA: Insufficient documentation

## 2016-12-17 DIAGNOSIS — I4891 Unspecified atrial fibrillation: Secondary | ICD-10-CM | POA: Diagnosis not present

## 2016-12-17 DIAGNOSIS — I340 Nonrheumatic mitral (valve) insufficiency: Secondary | ICD-10-CM | POA: Diagnosis not present

## 2016-12-17 DIAGNOSIS — E0590 Thyrotoxicosis, unspecified without thyrotoxic crisis or storm: Secondary | ICD-10-CM | POA: Diagnosis not present

## 2016-12-17 DIAGNOSIS — R0600 Dyspnea, unspecified: Secondary | ICD-10-CM | POA: Insufficient documentation

## 2016-12-17 LAB — ECHOCARDIOGRAM COMPLETE
E decel time: 194 msec
FS: 28 % (ref 28–44)
IV/PV OW: 1.06
LA ID, A-P, ES: 48 mm
LA diam end sys: 48 mm
LA diam index: 2.16 cm/m2
LA vol A4C: 88 ml
LA vol index: 37.4 mL/m2
LAVOL: 83 mL
LDCA: 2.54 cm2
LV PW d: 11.7 mm — AB (ref 0.6–1.1)
LVOT VTI: 17 cm
LVOT peak grad rest: 3 mmHg
LVOT peak vel: 82.7 cm/s
LVOTD: 18 mm
LVOTSV: 43 mL
MV Dec: 194
MV Peak grad: 4 mmHg
MV pk E vel: 99.7 m/s
RV LATERAL S' VELOCITY: 14.4 cm/s
Reg peak vel: 221 cm/s
TR max vel: 221 cm/s

## 2016-12-17 NOTE — Telephone Encounter (Addendum)
-----   Message from Lelon Perla, MD sent at 12/17/2016  8:46 AM EDT ----- DC cardizem and follow HR and BP Kirk Ruths  Spoke with pt, aware of medication change. Patient voiced understanding of range for bp and pulse while off diltiazem.

## 2017-03-18 ENCOUNTER — Ambulatory Visit: Payer: Medicare Other | Admitting: Cardiology

## 2017-03-24 NOTE — Progress Notes (Signed)
HPI: FU atrial fibrillation. Exercise treadmill in 2007 negative. Abdominal CT January 2013 showed coronary artery calcification but no abdominal aortic aneurysm. Echocardiogram March 2018 showed normal LV systolic function, moderate left atrial enlargement and right atrial enlargement and mild right ventricular enlargement. Holter monitor March 2018 showed atrial fibrillation with PVCs or aberrantly conducted beats with slow rate. Cardizem was discontinued. Since he was last seen he denies dyspnea, chest pain, palpitations or syncope. No bleeding.   Current Outpatient Prescriptions  Medication Sig Dispense Refill  . apixaban (ELIQUIS) 5 MG TABS tablet Take 1 tablet (5 mg total) by mouth 2 (two) times daily. 180 tablet 3  . doxycycline (VIBRAMYCIN) 50 MG capsule Take 50 mg by mouth 2 (two) times daily.     No current facility-administered medications for this visit.      Past Medical History:  Diagnosis Date  . Atrial fibrillation ([place])   . History of kidney stones 10-23-2011  SPONTANEOUSLY PASSED  . History of migraine   . Hyperthyroidism NO MEDS OR [place]   MONITORED BY DR Forde Dandy  . Mild acid reflux OCCASIONAL-  WATCHES DIET  . PONV (postoperative nausea and vomiting)     Past Surgical History:  Procedure Laterality Date  . CARDIOVERSION  10-15-2007  DR Lia Foyer   NEW ON-SET A-FIB  . ELBOW SURGERY     right  . KNEE ARTHROSCOPY WITH LATERAL MENISECTOMY  08/07/2012   Procedure: KNEE ARTHROSCOPY WITH LATERAL MENISECTOMY;  Surgeon: Magnus Sinning, MD;  Location: [facility];  Service: Orthopedics;  Laterality: Left;  Left Knee Arthroscopy with Partial Lateral Menisectomy   . RIGHT ELBOW SURGERY  1992 (APPROX)   TENDINITIS  . 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 VASECTOMY REVERSAL  1990  (APPROX)    Social History   Social History  . Marital status: Married    Spouse name: N/A  . Number of  children: 4  . Years of education: N/A   Occupational History  . retirement broker Hydrologist   Social History Main Topics  . Smoking status: Never Smoker  . Smokeless tobacco: Never Used  . Alcohol use 0.0 oz/week    3 - 4 Glasses of wine per week     Comment: occasional  . Drug use: No  . Sexual activity: Not on file   Other Topics Concern  . 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 He does not use cigarettes or recreational drugs. He does use alcohol occasionally.    Family History  Problem Relation Age of Onset  . Stroke Mother     ROS: Knee pain but no fevers or chills, productive cough, hemoptysis, dysphasia, odynophagia, melena, hematochezia, dysuria, hematuria, rash, seizure activity, orthopnea, PND, pedal edema, claudication. Remaining systems are negative.  Physical Exam: Well-developed well-nourished in no acute distress.  Skin is warm and dry.  HEENT is normal.  Neck is supple.  Chest is clear to auscultation with normal expansion.  Cardiovascular exam is irregular Abdominal exam nontender or distended. No masses palpated. Extremities show no edema. neuro grossly intact  ECG-Atrial fibrillation at a rate of 65. No ST changes. personally reviewed  A/P  1 Permanent atrial fibrillation-patient is doing well from a symptomatic standpoint. Continue apixaban. Cardizem was discontinued as his heart rate was decreased on previous Holter monitor.  2  palpitations-improved.  3 Preoperative evaluation-patient may require knee surgery. He does not have any cardiac symptoms with good functional capacity. LV function is normal. Therefore he may proceed without further cardiac evaluation. Would need to hold apixaban 2 days prior to procedure and resume after.    Kirk Ruths, MD

## 2017-04-07 ENCOUNTER — Ambulatory Visit (INDEPENDENT_AMBULATORY_CARE_PROVIDER_SITE_OTHER): Payer: Medicare Other | Admitting: Cardiology

## 2017-04-07 ENCOUNTER — Encounter: Payer: Self-pay | Admitting: Cardiology

## 2017-04-07 VITALS — BP 118/72 | HR 65 | Ht 72.0 in | Wt 220.0 lb

## 2017-04-07 DIAGNOSIS — R002 Palpitations: Secondary | ICD-10-CM

## 2017-04-07 DIAGNOSIS — Z01810 Encounter for preprocedural cardiovascular examination: Secondary | ICD-10-CM | POA: Diagnosis not present

## 2017-04-07 DIAGNOSIS — I482 Chronic atrial fibrillation: Secondary | ICD-10-CM | POA: Diagnosis not present

## 2017-04-07 DIAGNOSIS — I4821 Permanent atrial fibrillation: Secondary | ICD-10-CM

## 2017-04-07 NOTE — Patient Instructions (Signed)
Your physician wants you to follow-up in: 6 MONTHS WITH DR CRENSHAW You will receive a reminder letter in the mail two months in advance. If you don't receive a letter, please call our office to schedule the follow-up appointment.   If you need a refill on your cardiac medications before your next appointment, please call your pharmacy.  

## 2017-10-13 ENCOUNTER — Telehealth: Payer: Self-pay | Admitting: Cardiology

## 2017-10-13 DIAGNOSIS — I482 Chronic atrial fibrillation, unspecified: Secondary | ICD-10-CM

## 2017-10-13 NOTE — Telephone Encounter (Signed)
New Message    *STAT* If patient is at the pharmacy, call can be transferred to refill team.   1. Which medications need to be refilled? (please list name of each medication and dose if known) Eliquis 5mg  2x daily   2. Which pharmacy/location (including street and city if local pharmacy) is [facility]   3. Do they need a 30 day or 90 day supply? [place]

## 2017-10-14 MED ORDER — APIXABAN 5 MG PO TABS: 5.0000 mg | ORAL_TABLET | Freq: Two times a day (BID) | ORAL | 1 refills | Status: DC

## 2017-11-13 NOTE — Progress Notes (Signed)
HPI: FU atrial fibrillation. Exercise treadmill in 2007 negative. Abdominal CT January 2013 showed coronary artery calcification but no abdominal aortic aneurysm. Echocardiogram March 2018 showed normal LV systolic function, moderate left atrial enlargement and right atrial enlargement and mild right ventricular enlargement. Holter monitor March 2018 showed atrial fibrillation with PVCs or aberrantly conducted beats with slow rate. Cardizem was discontinued. Since he was last seen the patient denies any dyspnea on exertion, orthopnea, PND, pedal edema, palpitations, syncope or chest pain.   Current Outpatient Medications  Medication Sig Dispense Refill  . apixaban (ELIQUIS) 5 MG TABS tablet Take 1 tablet (5 mg total) by mouth 2 (two) times daily. 180 tablet 1  . doxycycline (VIBRAMYCIN) 50 MG capsule Take 50 mg by mouth 2 (two) times daily.     No current facility-administered medications for this visit.      Past Medical History:  Diagnosis Date  . Atrial fibrillation ([place])   . History of kidney stones 10-23-2011  SPONTANEOUSLY PASSED  . History of migraine   . Hyperthyroidism NO MEDS OR [place]   MONITORED BY DR Forde Dandy  . Mild acid reflux OCCASIONAL-  WATCHES DIET  . PONV (postoperative nausea and vomiting)     Past Surgical History:  Procedure Laterality Date  . CARDIOVERSION  10-15-2007  DR Lia Foyer   NEW ON-SET A-FIB  . ELBOW SURGERY     right  . KNEE ARTHROSCOPY WITH LATERAL MENISECTOMY  08/07/2012   Procedure: KNEE ARTHROSCOPY WITH LATERAL MENISECTOMY;  Surgeon: Magnus Sinning, MD;  Location: [facility];  Service: Orthopedics;  Laterality: Left;  Left Knee Arthroscopy with Partial Lateral Menisectomy   . RIGHT ELBOW SURGERY  1992 (APPROX)   TENDINITIS  . 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 VASECTOMY REVERSAL  1990  (APPROX)    Social History   Socioeconomic History  . Marital status:  Married    Spouse name: Not on file  . Number of children: 4  . Years of education: Not on file  . Highest education level: Not on file  Social Needs  . Financial resource strain: Not on file  . Food insecurity - worry: Not on file  . Food insecurity - inability: Not on file  . Transportation needs - medical: Not on file  . Transportation needs - non-medical: Not on file  Occupational History  . Occupation: retirement Financial controller: [employer]  Tobacco Use  . Smoking status: Never Smoker  . Smokeless tobacco: Never Used  Substance and Sexual Activity  . Alcohol use: Yes    Alcohol/week: 0.0 oz    Types: 3 - 4 Glasses of wine per week    Comment: occasional  . Drug use: No  . Sexual activity: Not on file  Other Topics Concern  . 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 He does not use cigarettes or recreational drugs. He does use alcohol occasionally.    Family History  Problem Relation Age of Onset  . Stroke Mother     ROS: no fevers or chills, productive cough, hemoptysis, dysphasia, odynophagia, melena, hematochezia, dysuria, hematuria, rash, seizure activity, orthopnea, PND, pedal edema, claudication. Remaining systems are negative.  Physical Exam: Well-developed well-nourished in no acute distress.  Skin is warm and dry.  HEENT is normal.  Neck is supple.  Chest  is clear to auscultation with normal expansion.  Cardiovascular exam is irregular Abdominal exam nontender or distended. No masses palpated. Extremities show no edema. neuro grossly intact  A/P  1 atrial fibrillation-patient is doing well from a symptomatic point.  Continue apixaban.  Check hemoglobin and renal function.  His rate is controlled on no medications.    2 palpitations-not having significant issues with his now.   Kirk Ruths, MD

## 2017-11-20 ENCOUNTER — Ambulatory Visit (INDEPENDENT_AMBULATORY_CARE_PROVIDER_SITE_OTHER): Payer: Medicare Other | Admitting: Cardiology

## 2017-11-20 ENCOUNTER — Encounter: Payer: Self-pay | Admitting: Cardiology

## 2017-11-20 VITALS — BP 112/68 | HR 82 | Ht 73.0 in | Wt 219.0 lb

## 2017-11-20 DIAGNOSIS — R002 Palpitations: Secondary | ICD-10-CM

## 2017-11-20 DIAGNOSIS — Z7901 Long term (current) use of anticoagulants: Secondary | ICD-10-CM

## 2017-11-20 DIAGNOSIS — I482 Chronic atrial fibrillation: Secondary | ICD-10-CM | POA: Diagnosis not present

## 2017-11-20 DIAGNOSIS — I4821 Permanent atrial fibrillation: Secondary | ICD-10-CM

## 2017-11-20 LAB — BASIC METABOLIC PANEL
BUN / CREAT RATIO: 14 (ref 10–24)
BUN: 14 mg/dL (ref 8–27)
CHLORIDE: 105 mmol/L (ref 96–106)
CO2: 25 mmol/L (ref 20–29)
Calcium: 8.8 mg/dL (ref 8.6–10.2)
Creatinine, Ser: 1.01 mg/dL (ref 0.76–1.27)
GFR calc Af Amer: 87 mL/min/{1.73_m2} (ref >59)
GFR calc non Af Amer: 75 mL/min/{1.73_m2} (ref >59)
Glucose: 94 mg/dL (ref 65–99)
POTASSIUM: 4.5 mmol/L (ref 3.5–5.2)
SODIUM: 143 mmol/L (ref 134–144)

## 2017-11-20 LAB — CBC
Hematocrit: 45.9 % (ref 37.5–51.0)
Hemoglobin: 15.8 g/dL (ref 13.0–17.7)
MCH: 31.9 pg (ref 26.6–33.0)
MCHC: 34.4 g/dL (ref 31.5–35.7)
MCV: 93 fL (ref 79–97)
PLATELETS: 210 10*3/uL (ref 150–379)
RBC: 4.96 x10E6/uL (ref 4.14–5.80)
RDW: 13.8 % (ref 12.3–15.4)
WBC: 5 10*3/uL (ref 3.4–10.8)

## 2017-11-20 NOTE — Patient Instructions (Signed)

## 2018-01-26 ENCOUNTER — Telehealth: Payer: Self-pay | Admitting: Cardiology

## 2018-01-26 DIAGNOSIS — I482 Chronic atrial fibrillation, unspecified: Secondary | ICD-10-CM

## 2018-01-26 NOTE — Telephone Encounter (Signed)
New Message: ° ° ° ° ° ° °*STAT* If patient is at the pharmacy, call can be transferred to refill team. ° ° °1. Which medications need to be refilled? (please list name of each medication and dose if known) apixaban (ELIQUIS) 5 MG TABS tablet ° °2. Which pharmacy/location (including street and city if local pharmacy) is medication to be sent to?[facility] - [place], [place] - [address] AT Portal to Registered [facility] Sites ° °3. Do they need a 30 day or 90 day supply? 90 ° °

## 2018-01-27 MED ORDER — APIXABAN 5 MG PO TABS: 5.0000 mg | ORAL_TABLET | Freq: Two times a day (BID) | ORAL | 1 refills | Status: DC

## 2018-05-04 ENCOUNTER — Other Ambulatory Visit: Payer: Self-pay | Admitting: Cardiology

## 2018-05-04 DIAGNOSIS — I482 Chronic atrial fibrillation, unspecified: Secondary | ICD-10-CM

## 2018-06-02 NOTE — Progress Notes (Signed)
HPI: FU atrial fibrillation. Exercise treadmill in 2007 negative. Abdominal CT January 2013 showed coronary artery calcification but no abdominal aortic aneurysm. Echocardiogram March 2018 showed normal LV systolic function, moderate left atrial enlargement and right atrial enlargement and mild right ventricular enlargement. Holter monitor March 2018 showed atrial fibrillation with PVCs or aberrantly conducted beats with slow rate. Cardizem was discontinued. Since he was last seenthe patient denies any dyspnea on exertion, orthopnea, PND, pedal edema, palpitations, syncope or chest pain. No bleeding  Current Outpatient Medications  Medication Sig Dispense Refill  . Doxycycline Monohydrate (VIBRAMYCIN PO) Take 40 mg by mouth daily.    Marland Kitchen ELIQUIS 5 MG TABS tablet TAKE 1 TABLET TWICE A DAY 180 tablet 1   No current facility-administered medications for this visit.      Past Medical History:  Diagnosis Date  . Atrial fibrillation ([place])   . History of kidney stones 10-23-2011  SPONTANEOUSLY PASSED  . History of migraine   . Hyperthyroidism NO MEDS OR [place]   MONITORED BY DR Forde Dandy  . Mild acid reflux OCCASIONAL-  WATCHES DIET  . PONV (postoperative nausea and vomiting)     Past Surgical History:  Procedure Laterality Date  . CARDIOVERSION  10-15-2007  DR Lia Foyer   NEW ON-SET A-FIB  . ELBOW SURGERY     right  . KNEE ARTHROSCOPY WITH LATERAL MENISECTOMY  08/07/2012   Procedure: KNEE ARTHROSCOPY WITH LATERAL MENISECTOMY;  Surgeon: Magnus Sinning, MD;  Location: [facility];  Service: Orthopedics;  Laterality: Left;  Left Knee Arthroscopy with Partial Lateral Menisectomy   . RIGHT ELBOW SURGERY  1992 (APPROX)   TENDINITIS  . 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 VASECTOMY REVERSAL  1990  (APPROX)    Social History   Socioeconomic History  . Marital status: Married    Spouse name: Not on file  . Number  of children: 4  . Years of education: Not on file  . Highest education level: Not on file  Occupational History  . Occupation: retirement Financial controller: [employer]  . Financial resource strain: Not on file  . Food insecurity:    Worry: Not on file    Inability: Not on file  . Transportation needs:    Medical: Not on file    Non-medical: Not on file  Tobacco Use  . Smoking status: Never Smoker  . Smokeless tobacco: Never Used  Substance and Sexual Activity  . Alcohol use: Yes    Alcohol/week: 3.0 - 4.0 standard drinks    Types: 3 - 4 Glasses of wine per week    Comment: occasional  . Drug use: No  . Sexual activity: Not on file  Lifestyle  . Physical activity:    Days per week: Not on file    Minutes per session: Not on file  . Stress: Not on file  Relationships  . Social connections:    Talks on phone: Not on file    Gets together: Not on file    Attends religious service: Not on file    Active member of club or organization: Not on file    Attends meetings of clubs or organizations: Not on file    Relationship status: Not on file  . Intimate partner violence:    Fear of current or ex partner: Not on file    Emotionally abused:  Not on file    Physically abused: Not on file    Forced sexual activity: Not on file  Other Topics Concern  . 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 He does not use cigarettes or recreational drugs. He does use alcohol occasionally.    Family History  Problem Relation Age of Onset  . Stroke Mother     ROS: no fevers or chills, productive cough, hemoptysis, dysphasia, odynophagia, melena, hematochezia, dysuria, hematuria, rash, seizure activity, orthopnea, PND, pedal edema, claudication. Remaining systems are negative.  Physical Exam: Well-developed well-nourished in no acute distress.  Skin is warm and dry.  HEENT is normal.  Neck is  supple.  Chest is clear to auscultation with normal expansion.  Cardiovascular exam is irregular Abdominal exam nontender or distended. No masses palpated. Extremities show no edema. neuro grossly intact  ECG-atrial fibrillation at a rate of 71.  No ST changes.  Personally reviewed  A/P  1 permanent atrial fibrillation-plan to continue apixaban.  Patient's heart rate is controlled on no medications.  He remains asymptomatic.  2 palpitations-symptoms have resolved.  Kirk Ruths, MD

## 2018-06-04 ENCOUNTER — Ambulatory Visit (INDEPENDENT_AMBULATORY_CARE_PROVIDER_SITE_OTHER): Payer: Medicare Other | Admitting: Cardiology

## 2018-06-04 ENCOUNTER — Encounter: Payer: Self-pay | Admitting: Cardiology

## 2018-06-04 VITALS — BP 112/80 | HR 71 | Ht 73.0 in | Wt 214.0 lb

## 2018-06-04 DIAGNOSIS — R002 Palpitations: Secondary | ICD-10-CM

## 2018-06-04 DIAGNOSIS — I4891 Unspecified atrial fibrillation: Secondary | ICD-10-CM | POA: Diagnosis not present

## 2018-06-04 NOTE — Patient Instructions (Addendum)
Medication Instructions: Your physician recommends that you continue on your current medications as directed.    If you need a refill on your cardiac medications before your next appointment, please call your pharmacy.   Labwork: None  Procedures/Testing: None  Follow-Up: Your physician wants you to follow-up 6 months in with Dr. Stanford Breed. You will receive a reminder letter in the mail two months in advance. If you don't receive a letter, please call our office at [phone] to schedule this follow-up appointment.   Special Instructions:    Thank you for choosing [facility] at [facility]!!

## 2018-10-15 ENCOUNTER — Other Ambulatory Visit: Payer: Self-pay | Admitting: Cardiology

## 2018-10-15 DIAGNOSIS — I482 Chronic atrial fibrillation, unspecified: Secondary | ICD-10-CM

## 2019-01-01 ENCOUNTER — Telehealth: Payer: Self-pay

## 2019-01-01 NOTE — Telephone Encounter (Signed)
Virtual Visit Pre-Appointment Phone Call  Steps For Call:  1. Confirm consent - "In the setting of the current Covid19 crisis, you are scheduled for a (phone or video) visit with your provider on (01/08/19) at (8:20am).  Just as we do with many in-office visits, in order for you to participate in this visit, we must obtain consent.  If you'd like, I can send this to your mychart (if signed up) or email for you to review.  Otherwise, I can obtain your verbal consent now.  All virtual visits are billed to your insurance company just like a normal visit would be.  By agreeing to a virtual visit, we'd like you to understand that the technology does not allow for your provider to perform an examination, and thus may limit your provider's ability to fully assess your condition.  Finally, though the technology is pretty good, we cannot assure that it will always work on either your or our end, and in the setting of a video visit, we may have to convert it to a phone-only visit.  In either situation, we cannot ensure that we have a secure connection.  Are you willing to proceed?"  2. Give patient instructions for WebEx download to smartphone as below if video visit  3. Advise patient to be prepared with any vital sign or heart rhythm information, their current medicines, and a piece of paper and pen handy for any instructions they may receive the day of their visit  4. Inform patient they will receive a phone call 15 minutes prior to their appointment time (may be from unknown caller ID) so they should be prepared to answer  5. Confirm that appointment type is correct in Epic appointment notes (video vs telephone)    TELEPHONE CALL NOTE  Kyle [name] has been deemed a candidate for a follow-up tele-health visit to limit community exposure during the Covid-19 pandemic. I spoke with the patient via phone to ensure availability of phone/video source, confirm preferred email & phone number, and discuss  instructions and expectations.  I reminded Kyle [name] to be prepared with any vital sign and/or heart rhythm information that could potentially be obtained via home monitoring, at the time of his visit. I reminded Kyle [name] to expect a phone call at the time of his visit if his visit.  Did the patient verbally acknowledge consent to treatment? yes  Crissie Reese, CMA 01/01/2019 11:42 AM   DOWNLOADING THE [place]  - If Apple, go to [employer] and type in WebEx in the search bar. [place] [employer], the blue/green circle. The app is free but as with any other app downloads, their phone may require them to verify saved payment information or Apple password. The patient does NOT have to create an account.  - If Android, ask patient to go to [employer] and type in WebEx in the search bar. [place] [employer], the blue/green circle. The app is free but as with any other app downloads, their phone may require them to verify saved payment information or Android password. The patient does NOT have to create an account.   CONSENT FOR TELE-HEALTH VISIT - PLEASE REVIEW  I hereby voluntarily request, consent and authorize [facility] and its employed or contracted physicians, physician assistants, nurse practitioners or other licensed health care professionals (the Practitioner), to provide me with telemedicine health care services (the "Services") as deemed necessary by the treating Practitioner.  I acknowledge and consent to receive the Services by the Practitioner via telemedicine. I understand that the telemedicine visit will involve communicating with the Practitioner through live audiovisual communication technology and the disclosure of certain medical information by electronic transmission. I acknowledge that I have been given the opportunity to request an in-person assessment or other available alternative prior to the telemedicine visit and am  voluntarily participating in the telemedicine visit.  I understand that I have the right to withhold or withdraw my consent to the use of telemedicine in the course of my care at any time, without affecting my right to future care or treatment, and that the Practitioner or I may terminate the telemedicine visit at any time. I understand that I have the right to inspect all information obtained and/or recorded in the course of the telemedicine visit and may receive copies of available information for a reasonable fee.  I understand that some of the potential risks of receiving the Services via telemedicine include:  Marland Kitchen Delay or interruption in medical evaluation due to technological equipment failure or disruption; . Information transmitted may not be sufficient (e.g. poor resolution of images) to allow for appropriate medical decision making by the Practitioner; and/or  . In rare instances, security protocols could fail, causing a breach of personal health information.  Furthermore, I acknowledge that it is my responsibility to provide information about my medical history, conditions and care that is complete and accurate to the best of my ability. I acknowledge that Practitioner's advice, recommendations, and/or decision may be based on factors not within their control, such as incomplete or inaccurate data provided by me or distortions of diagnostic images or specimens that may result from electronic transmissions. I understand that the practice of medicine is not an exact science and that Practitioner makes no warranties or guarantees regarding treatment outcomes. I acknowledge that I will receive a copy of this consent concurrently upon execution via email to the email address I last provided but may also request a printed copy by calling the office of [place].    I understand that my insurance will be billed for this visit.   I have read or had this consent read to me. . I understand the  contents of this consent, which adequately explains the benefits and risks of the Services being provided via telemedicine.  . I have been provided ample opportunity to ask questions regarding this consent and the Services and have had my questions answered to my satisfaction. . I give my informed consent for the services to be provided through the use of telemedicine in my medical care  By participating in this telemedicine visit I agree to the above.

## 2019-01-07 ENCOUNTER — Telehealth: Payer: Self-pay | Admitting: Cardiology

## 2019-01-07 NOTE — Telephone Encounter (Signed)
Follow up: ° ° °Patient returning call back °

## 2019-01-07 NOTE — Progress Notes (Signed)
Virtual Visit via Video Note   This visit type was conducted due to national recommendations for restrictions regarding the COVID-19 Pandemic (e.g. social distancing) in an effort to limit this patient's exposure and mitigate transmission in our community.  Due to his co-morbid illnesses, this patient is at least at moderate risk for complications without adequate follow up.  This format is felt to be most appropriate for this patient at this time.  All issues noted in this document were discussed and addressed.  A limited physical exam was performed with this format.  Please refer to the patient's chart for his consent to telehealth for [facility].   Evaluation Performed:  Follow-up visit  Date:  01/08/2019   ID:  Kyle [name], [place] Jul 21, 1946, MRN [id number]  Patient Location: Home Provider Location: Home  PCP:  Stephens Shire, MD  Cardiologist:  Dr Stanford Breed  Chief Complaint:  Atrial fibrillation  History of Present Illness:    FU atrial fibrillation. Exercise treadmill in 2007 negative. Abdominal CT January 2013 showed coronary artery calcification but no abdominal aortic aneurysm. Echocardiogram March 2018 showed normal LV systolic function, moderate left atrial enlargement and right atrial enlargement and mild right ventricular enlargement. Holter monitor March 2018 showed atrial fibrillation with PVCs or aberrantly conducted beats with slow rate. Cardizem was discontinued. Since he was last seenthe patient denies any dyspnea on exertion, orthopnea, PND, pedal edema, palpitations, syncope or chest pain.   The patient does not have symptoms concerning for COVID-19 infection (fever, chills, cough, or new shortness of breath).    Past Medical History:  Diagnosis Date  . Atrial fibrillation ([place])   . History of kidney stones 10-23-2011  SPONTANEOUSLY PASSED  . History of migraine   . Hyperthyroidism NO MEDS OR TX   MONITORED BY DR Forde Dandy  . Mild acid reflux OCCASIONAL-   WATCHES DIET  . PONV (postoperative nausea and vomiting)    Past Surgical History:  Procedure Laterality Date  . CARDIOVERSION  10-15-2007  DR Lia Foyer   NEW ON-SET A-FIB  . ELBOW SURGERY     right  . KNEE ARTHROSCOPY WITH LATERAL MENISECTOMY  08/07/2012   Procedure: KNEE ARTHROSCOPY WITH LATERAL MENISECTOMY;  Surgeon: Magnus Sinning, MD;  Location: [facility];  Service: Orthopedics;  Laterality: Left;  Left Knee Arthroscopy with Partial Lateral Menisectomy   . RIGHT ELBOW SURGERY  1992 (APPROX)   TENDINITIS  . 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 VASECTOMY REVERSAL  1990  (APPROX)     Current Meds  Medication Sig  . Doxycycline Monohydrate (VIBRAMYCIN PO) Take 20 mg by mouth 2 (two) times daily.   Marland Kitchen ELIQUIS 5 MG TABS tablet TAKE 1 TABLET TWICE A DAY     Allergies:   Ampicillin   Social History   Tobacco Use  . Smoking status: Never Smoker  . Smokeless tobacco: Never Used  Substance Use Topics  . Alcohol use: Yes    Alcohol/week: 3.0 - 4.0 standard drinks    Types: 3 - 4 Glasses of wine per week    Comment: occasional  . Drug use: No     Family Hx: The patient's family history includes Stroke in his mother.  ROS:   Please see the history of present illness.    No fevers, chills, productive cough. Notes some fatigue All other systems reviewed and are negative.   Recent Lipid Panel Lab Results  Component Value Date/Time   CHOL 154 12/05/2006 08:45 AM   TRIG 57 12/05/2006 08:45 AM   HDL 33.3 (L) 12/05/2006 08:45 AM   CHOLHDL 4.6 CALC 12/05/2006 08:45 AM   LDLCALC 109 (H) 12/05/2006 08:45 AM    Wt Readings from Last 3 Encounters:  01/08/19 205 lb (93 kg)  06/04/18 214 lb (97.1 kg)  11/20/17 219 lb (99.3 kg)     Objective:    Vital Signs:  BP 111/69   Pulse 73   Ht 6\' 1"  (1.854 m)   Wt 205 lb (93 kg)   BMI 27.05 kg/m    Well nourished, well developed male in no acute  distress.  Answers questions appropriately Remainder of physical exam not performed (telehealth visit; coronavirus pandemic)  ASSESSMENT & PLAN:    1. Permanent atrial fibrillation-patient's heart rate has been controlled on no medications previously.  He continues to be asymptomatic.  We will plan to continue with apixaban.  Patient will need CBC and bmet. 2. History of palpitations-no further symptoms.  We will follow. 3. Coronary calcification-we will check lipids at time of blood work.  If LDL elevated will add statin.  The importance of social distancing was discussed today.  Time:   Today, I have spent 15 minutes with the patient with telehealth technology discussing the above problems.     Medication Adjustments/Labs and Tests Ordered: Current medicines are reviewed at length with the patient today.  Concerns regarding medicines are outlined above.   Tests Ordered: No orders of the defined types were placed in this encounter.   Medication Changes: No orders of the defined types were placed in this encounter.   Disposition:  Follow up six months  Signed, Kirk Ruths, MD  01/08/2019 8:42 AM    [place]

## 2019-01-07 NOTE — Telephone Encounter (Addendum)
LVM for pre reg x3 °

## 2019-01-08 ENCOUNTER — Other Ambulatory Visit: Payer: Self-pay

## 2019-01-08 ENCOUNTER — Telehealth (INDEPENDENT_AMBULATORY_CARE_PROVIDER_SITE_OTHER): Payer: Medicare Other | Admitting: Cardiology

## 2019-01-08 ENCOUNTER — Encounter: Payer: Self-pay | Admitting: Cardiology

## 2019-01-08 ENCOUNTER — Encounter

## 2019-01-08 ENCOUNTER — Ambulatory Visit: Payer: Medicare Other | Admitting: Cardiology

## 2019-01-08 VITALS — BP 111/69 | HR 73 | Ht 73.0 in | Wt 205.0 lb

## 2019-01-08 DIAGNOSIS — R002 Palpitations: Secondary | ICD-10-CM

## 2019-01-08 DIAGNOSIS — I4821 Permanent atrial fibrillation: Secondary | ICD-10-CM

## 2019-01-08 NOTE — Patient Instructions (Signed)

## 2019-04-03 ENCOUNTER — Other Ambulatory Visit: Payer: Self-pay

## 2019-04-03 ENCOUNTER — Encounter (HOSPITAL_BASED_OUTPATIENT_CLINIC_OR_DEPARTMENT_OTHER): Payer: Self-pay | Admitting: *Deleted

## 2019-04-03 ENCOUNTER — Emergency Department (HOSPITAL_BASED_OUTPATIENT_CLINIC_OR_DEPARTMENT_OTHER)
Admission: EM | Admit: 2019-04-03 | Discharge: 2019-04-04 | Disposition: A | Discharge status: Home / Self Care | Payer: Medicare Other | Attending: Emergency Medicine | Admitting: Emergency Medicine

## 2019-04-03 DIAGNOSIS — L03115 Cellulitis of right lower limb: Secondary | ICD-10-CM | POA: Insufficient documentation

## 2019-04-03 DIAGNOSIS — Z7901 Long term (current) use of anticoagulants: Secondary | ICD-10-CM | POA: Diagnosis not present

## 2019-04-03 DIAGNOSIS — L539 Erythematous condition, unspecified: Secondary | ICD-10-CM | POA: Diagnosis present

## 2019-04-03 HISTORY — DX: Rosacea, unspecified: L71.9

## 2019-04-03 NOTE — ED Triage Notes (Signed)
Pt reports itchy rash to right ankle on Thursday. It got worse yesterday. This evening when he got out of shower the area was blistered

## 2019-04-04 MED ORDER — DOXYCYCLINE HYCLATE 100 MG PO CAPS: 100.0000 mg | ORAL_CAPSULE | Freq: Two times a day (BID) | ORAL | 0 refills | Status: AC

## 2019-04-04 NOTE — ED Notes (Signed)
ED Provider at bedside. 

## 2019-04-04 NOTE — Discharge Instructions (Signed)
Discontinue the use of your regular doxycycline and change dosing to 100 mg twice a day for 7 days.  After completion of this course you may return to your regular dose doxycycline.  Please follow-up with your primary care provider and/or your dermatologist if the rash does not improve with this antibiotic within 5 days.

## 2019-04-04 NOTE — ED Provider Notes (Signed)
[facility] Provider Note  CSN: [id number] Arrival date & time: 04/03/19 2316  Chief Complaint(s) Wound Check  HPI Kyle [name] is a 73 y.o. male with a history of atrial fibrillation on Eliquis and rosacea on doxycycline who presents to the emergency department with several days of right lower extremity redness and itching.  He reports that this began 3 to 4 days ago.  Reports that prior to this he was doing yard work at home with a weed eater.  Believes that he was hit with something.  He is endorsing itching and redness.  Reports that he developed blisters tonight and came in for evaluation.  No fevers or chills.  No other known trauma.  No prior history of DVTs.  Denies any other physical complaints.  The history is provided by the patient.    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 Authorizing Provider  ELIQUIS 5 MG TABS tablet TAKE 1 TABLET TWICE A DAY 10/15/18  Yes Crenshaw, Denice Bors, MD  doxycycline (VIBRAMYCIN) 100 MG capsule Take 1 capsule (100 mg total) by mouth 2 (two) times daily for 7 days. 04/04/19 04/11/19  Fatima Blank, MD                                                                                                                                    Past Surgical History Past Surgical History:  Procedure Laterality Date    CARDIOVERSION  10-15-2007  DR Lia Foyer   NEW ON-SET A-FIB   ELBOW SURGERY     right   ELBOW SURGERY     KNEE ARTHROSCOPY WITH LATERAL MENISECTOMY  08/07/2012   Procedure: KNEE ARTHROSCOPY WITH LATERAL MENISECTOMY;  Surgeon: Magnus Sinning, MD;  Location: [facility];  Service: Orthopedics;  Laterality: Left;  Left Knee Arthroscopy with Partial Lateral Menisectomy    KNEE SURGERY     RIGHT ELBOW SURGERY  1992 (APPROX)   TENDINITIS   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  09-12-2010  DR Dalbert Batman   LEFT LATERAL WALL   VASECTOMY REVERSAL  1990  (APPROX)   Family History Family History  Problem Relation Age of Onset   Stroke Mother     Social History Social History  Tobacco Use   Smoking status: Never Smoker   Smokeless tobacco: Never Used  Substance Use Topics   Alcohol use: Yes    Alcohol/week: 3.0 - 4.0 standard drinks    Types: 3 - 4 Glasses of wine per week    Comment: occasional   Drug use: No   Allergies Ampicillin  Review of Systems Review of Systems All other systems are reviewed and are negative for acute change except as noted in the HPI  Physical Exam Vital Signs  I have reviewed the triage vital signs BP 120/72 (BP Location: Right Arm)    Pulse 86    Temp 98.3 F (36.8 C) (Oral)    Resp 16    Ht 6\' 1"  (1.854 m)    Wt 95.3 kg    SpO2 97%    BMI 27.71 kg/m   Physical Exam Vitals signs reviewed.  Constitutional:      General: He is not in acute distress.    Appearance: He is well-developed. He is not diaphoretic.  HENT:     Head: Normocephalic and atraumatic.     Jaw: No trismus.     Right Ear: External ear normal.     Left Ear: External ear normal.     Nose: Nose normal.  Eyes:     General: No scleral icterus.    Conjunctiva/sclera: Conjunctivae normal.  Neck:     Musculoskeletal: Normal range of motion.     Trachea: Phonation normal.  Cardiovascular:     Rate and Rhythm: Normal rate and regular rhythm.    Pulmonary:     Effort: Pulmonary effort is normal. No respiratory distress.     Breath sounds: No stridor.  Abdominal:     General: There is no distension.  Musculoskeletal: Normal range of motion.       Legs:  Neurological:     Mental Status: He is alert and oriented to person, place, and time.  Psychiatric:        Behavior: Behavior normal.     ED Results and Treatments Labs (all labs ordered are listed, but only abnormal results are displayed) Labs Reviewed - No data to display                                                                                                                       EKG  EKG Interpretation  Date/Time:    Ventricular Rate:    PR Interval:    QRS Duration:   QT Interval:    QTC Calculation:   R Axis:     Text Interpretation:        Radiology No results found.  Pertinent labs & imaging results that were available during my care of the patient were reviewed by me and considered in my medical decision making (see chart for details).  Medications Ordered in ED Medications - No data to display  Procedures Procedures  (including critical care time)  Medical Decision Making / ED Course I have reviewed the nursing notes for this encounter and the patient's prior records (if available in EHR or on provided paperwork).   Kyle [name] was evaluated in Emergency Department on 04/04/2019 for the symptoms described in the history of present illness. He was evaluated in the context of the global COVID-19 pandemic, which necessitated consideration that the patient might be at risk for infection with the SARS-CoV-2 virus that causes COVID-19. Institutional protocols and algorithms that pertain to the evaluation of patients at risk for COVID-19 are in a state of rapid change based on information released by regulatory bodies  including the CDC and federal and state organizations. These policies and algorithms were followed during the patient's care in the ED.  Presentation is most consistent with bullous cellulitis.  Not suspicious for abscess.  Does not involve the joint.   Discussed other possible etiologies which are less likely including bullous pemphigoid.  We will treat with antibiotics.  Increase his doxycycline to 100 mg twice daily for 7 days.  Recommended close PCP or dermatology follow-up.  The patient appears reasonably screened and/or stabilized for discharge and I doubt any other medical condition or other [facility] requiring further screening, evaluation, or treatment in the ED at this time prior to discharge.  The patient is safe for discharge with strict return precautions.       Final Clinical Impression(s) / ED Diagnoses Final diagnoses:  Cellulitis of right anterior lower leg     The patient appears reasonably screened and/or stabilized for discharge and I doubt any other medical condition or other [facility] requiring further screening, evaluation, or treatment in the ED at this time prior to discharge.  Disposition: Discharge  Condition: Good  I have discussed the results, Dx and Tx plan with the patient who expressed understanding and agree(s) with the plan. Discharge instructions discussed at great length. The patient was given strict return precautions who verbalized understanding of the instructions. No further questions at time of discharge.    ED Discharge Orders         Ordered    doxycycline (VIBRAMYCIN) 100 MG capsule  2 times daily     04/04/19 0108           Follow Up: Stephens Shire, MD [address] [place] [address] [phone]        This chart was dictated using voice recognition software.  Despite best efforts to proofread,  errors can occur which can change the documentation meaning.   Fatima Blank, MD 04/04/19 [phone]

## 2019-04-04 NOTE — ED Notes (Signed)
Verbal order received for RN to wrap patients wound prior to dc

## 2019-04-04 NOTE — ED Notes (Signed)
Pt understood dc material. NAD noted. Script given at [employer]. All question answered to satisfaction. Pt escorted to check out counter.

## 2019-06-23 ENCOUNTER — Other Ambulatory Visit: Payer: Self-pay | Admitting: Cardiology

## 2019-06-23 DIAGNOSIS — I482 Chronic atrial fibrillation, unspecified: Secondary | ICD-10-CM

## 2019-06-23 NOTE — Telephone Encounter (Signed)
64 M, 97.1 kg, SCr 0.9 (05/2019), LOV BCrenshaw 12/2018

## 2019-06-23 NOTE — Telephone Encounter (Signed)
Refill request

## 2019-08-12 NOTE — Progress Notes (Signed)
HPI: FU atrial fibrillation. Exercise treadmill in 2007 negative. Abdominal CT January 2013 showed coronary artery calcification but no abdominal aortic aneurysm. Echocardiogram March 2018 showed normal LV systolic function, moderate left atrial enlargement and right atrial enlargement and mild right ventricular enlargement. Holter monitor March 2018 showed atrial fibrillation with PVCs or aberrantly conducted beats with slow rate. Cardizem was discontinued. Since he was last seenhe denies dyspnea, chest pain, syncope or bleeding.  Occasional brief palpitations not particularly bothersome.  Current Outpatient Medications  Medication Sig Dispense Refill  . doxycycline (ORACEA) 40 MG capsule Take 40 mg by mouth 2 (two) times daily.    Marland Kitchen ELIQUIS 5 MG TABS tablet TAKE 1 TABLET TWICE A DAY 180 tablet 1   No current facility-administered medications for this visit.      Past Medical History:  Diagnosis Date  . Atrial fibrillation ([place])   . History of kidney stones 10-23-2011  SPONTANEOUSLY PASSED  . History of migraine   . Hyperthyroidism NO MEDS OR TX   MONITORED BY DR Forde Dandy  . Mild acid reflux OCCASIONAL-  WATCHES DIET  . PONV (postoperative nausea and vomiting)   . Rosacea     Past Surgical History:  Procedure Laterality Date  . CARDIOVERSION  10-15-2007  DR Lia Foyer   NEW ON-SET A-FIB  . ELBOW SURGERY     right  . ELBOW SURGERY    . KNEE ARTHROSCOPY WITH LATERAL MENISECTOMY  08/07/2012   Procedure: KNEE ARTHROSCOPY WITH LATERAL MENISECTOMY;  Surgeon: Magnus Sinning, MD;  Location: [facility];  Service: Orthopedics;  Laterality: Left;  Left Knee Arthroscopy with Partial Lateral Menisectomy   . KNEE SURGERY    . RIGHT ELBOW SURGERY  1992 (APPROX)   TENDINITIS  . 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 VASECTOMY REVERSAL  1990  (APPROX)    Social History   Socioeconomic History  . Marital  status: Married    Spouse name: Not on file  . Number of children: 4  . Years of education: Not on file  . Highest education level: Not on file  Occupational History  . Occupation: retirement Financial controller: [employer]  . Financial resource strain: Not on file  . Food insecurity    Worry: Not on file    Inability: Not on file  . Transportation needs    Medical: Not on file    Non-medical: Not on file  Tobacco Use  . Smoking status: Never Smoker  . Smokeless tobacco: Never Used  Substance and Sexual Activity  . Alcohol use: Yes    Alcohol/week: 3.0 - 4.0 standard drinks    Types: 3 - 4 Glasses of wine per week    Comment: occasional  . Drug use: No  . Sexual activity: Not on file  Lifestyle  . Physical activity    Days per week: Not on file    Minutes per session: Not on file  . Stress: Not on file  Relationships  . Social Herbalist on phone: Not on file    Gets together: Not on file    Attends religious service: Not on file    Active member of club or organization: Not on file    Attends meetings of clubs or organizations: Not on file    Relationship status: Not on file  . Intimate partner violence  Fear of current or ex partner: Not on file    Emotionally abused: Not on file    Physically abused: Not on file    Forced sexual activity: Not on file  Other Topics Concern  . 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 He does not use cigarettes or recreational drugs. He does use alcohol occasionally.    Family History  Problem Relation Age of Onset  . Stroke Mother     ROS: no fevers or chills, productive cough, hemoptysis, dysphasia, odynophagia, melena, hematochezia, dysuria, hematuria, rash, seizure activity, orthopnea, PND, pedal edema, claudication. Remaining systems are negative.  Physical Exam: Well-developed well-nourished in no acute distress.   Skin is warm and dry.  HEENT is normal.  Neck is supple.  Chest is clear to auscultation with normal expansion.  Cardiovascular exam is irregular Abdominal exam nontender or distended. No masses palpated. Extremities show no edema. neuro grossly intact  ECG-atrial fibrillation at a rate of 70, no ST changes.  Personally reviewed  A/P   1 Permanent atrial fibrillation-HR controlled on no meds; continue apixaban; I will have most recent hemoglobin and renal function forwarded to [place] from primary care.  2 Palpitations-no recent symptoms.  3 Coronary calcification-no CP; no ASA given need for apixaban. I will have most recent lipids forwarded to [place] from primary care; begin statin as indictated.   Kirk Ruths, MD

## 2019-08-23 ENCOUNTER — Ambulatory Visit (INDEPENDENT_AMBULATORY_CARE_PROVIDER_SITE_OTHER): Payer: Medicare Other | Admitting: Cardiology

## 2019-08-23 ENCOUNTER — Encounter

## 2019-08-23 ENCOUNTER — Encounter: Payer: Self-pay | Admitting: Cardiology

## 2019-08-23 ENCOUNTER — Other Ambulatory Visit: Payer: Self-pay

## 2019-08-23 VITALS — BP 112/78 | HR 70 | Temp 98.4°F | Ht 73.0 in | Wt 222.2 lb

## 2019-08-23 DIAGNOSIS — R002 Palpitations: Secondary | ICD-10-CM

## 2019-08-23 DIAGNOSIS — I4821 Permanent atrial fibrillation: Secondary | ICD-10-CM | POA: Diagnosis not present

## 2019-08-23 DIAGNOSIS — I2510 Atherosclerotic heart disease of native coronary artery without angina pectoris: Secondary | ICD-10-CM | POA: Diagnosis not present

## 2019-08-23 NOTE — Patient Instructions (Signed)

## 2019-10-21 ENCOUNTER — Ambulatory Visit: Payer: Medicare Other

## 2019-10-28 ENCOUNTER — Ambulatory Visit: Payer: Medicare Other | Attending: Internal Medicine

## 2019-10-28 DIAGNOSIS — Z23 Encounter for immunization: Secondary | ICD-10-CM

## 2019-10-28 NOTE — Progress Notes (Signed)
   Covid-19 Vaccination Clinic  Name:  MARS BURCZYK    MRN: [id number] DOB: Nov 04, 1945  10/28/2019  Mr. Betten was observed post Covid-19 immunization for 15 minutes without incidence. He was provided with Vaccine Information Sheet and instruction to access the V-Safe system.   Mr. Burningham was instructed to call 911 with any severe reactions post vaccine: Marland Kitchen Difficulty breathing  . Swelling of your face and throat  . A fast heartbeat  . A bad rash all over your body  . Dizziness and weakness    Immunizations Administered    Name Date Dose VIS Date Route   Pfizer COVID-19 Vaccine 10/28/2019  2:04 PM 0.3 mL 09/03/2019 Intramuscular   Manufacturer: [place]   Lot: [id number]   [place]: [id number]

## 2019-11-01 ENCOUNTER — Ambulatory Visit: Payer: Medicare Other

## 2019-11-22 ENCOUNTER — Ambulatory Visit: Payer: Medicare Other | Attending: Internal Medicine

## 2019-11-22 DIAGNOSIS — Z23 Encounter for immunization: Secondary | ICD-10-CM | POA: Insufficient documentation

## 2019-11-22 NOTE — Progress Notes (Signed)
   Covid-19 Vaccination Clinic  Name:  Kyle [name]    MRN: [id number] DOB: 06-07-1946  11/22/2019  [name] was observed post Covid-19 immunization for 15 minutes without incidence. He was provided with Vaccine Information Sheet and instruction to access the V-Safe system.   [name] was instructed to call 911 with any severe reactions post vaccine: Marland Kitchen Difficulty breathing  . Swelling of your face and throat  . A fast heartbeat  . A bad rash all over your body  . Dizziness and weakness    Immunizations Administered    Name Date Dose VIS Date Route   Pfizer COVID-19 Vaccine 11/22/2019  4:39 PM 0.3 mL 09/03/2019 Intramuscular   Manufacturer: [place]   Lot: [id number]   [place]: [id number]

## 2019-12-20 ENCOUNTER — Other Ambulatory Visit: Payer: Self-pay | Admitting: Cardiology

## 2019-12-20 DIAGNOSIS — I482 Chronic atrial fibrillation, unspecified: Secondary | ICD-10-CM

## 2020-08-10 NOTE — Progress Notes (Signed)
HPI: FU atrial fibrillation. Exercise treadmill in 2007 negative. Abdominal CT January 2013 showed coronary artery calcification but no abdominal aortic aneurysm. Echocardiogram March 2018 showed normal LV systolic function, moderate left atrial enlargement and right atrial enlargement and mild right ventricular enlargement. Holter monitor March 2018 showed atrial fibrillation with PVCs or aberrantly conducted beats with slow rate. Cardizem was discontinued. Since he was last seenthere is no dyspnea, chest pain, palpitations, syncope or bleeding.  Current Outpatient Medications  Medication Sig Dispense Refill  . doxycycline (ORACEA) 40 MG capsule Take 40 mg by mouth 2 (two) times daily.    Marland Kitchen ELIQUIS 5 MG TABS tablet TAKE 1 TABLET TWICE A DAY 180 tablet 3   No current facility-administered medications for this visit.     Past Medical History:  Diagnosis Date  . Atrial fibrillation ([place])   . History of kidney stones 10-23-2011  SPONTANEOUSLY PASSED  . History of migraine   . Hyperthyroidism NO MEDS OR TX   MONITORED BY DR Forde Dandy  . Mild acid reflux OCCASIONAL-  WATCHES DIET  . PONV (postoperative nausea and vomiting)   . Rosacea     Past Surgical History:  Procedure Laterality Date  . CARDIOVERSION  10-15-2007  DR Lia Foyer   NEW ON-SET A-FIB  . ELBOW SURGERY     right  . ELBOW SURGERY    . KNEE ARTHROSCOPY WITH LATERAL MENISECTOMY  08/07/2012   Procedure: KNEE ARTHROSCOPY WITH LATERAL MENISECTOMY;  Surgeon: Magnus Sinning, MD;  Location: [facility];  Service: Orthopedics;  Laterality: Left;  Left Knee Arthroscopy with Partial Lateral Menisectomy   . KNEE SURGERY    . RIGHT ELBOW SURGERY  1992 (APPROX)   TENDINITIS  . 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 VASECTOMY REVERSAL  1990  (APPROX)    Social History   Socioeconomic History  . Marital status: Married    Spouse name: Not on file  .  Number of children: 4  . Years of education: Not on file  . Highest education level: Not on file  Occupational History  . Occupation: retirement Financial controller: [employer]  Tobacco Use  . Smoking status: Never Smoker  . Smokeless tobacco: Never Used  Vaping Use  . Vaping Use: Never used  Substance and Sexual Activity  . Alcohol use: Yes    Alcohol/week: 3.0 - 4.0 standard drinks    Types: 3 - 4 Glasses of wine per week    Comment: occasional  . Drug use: No  . Sexual activity: Not on file  Other Topics Concern  . 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 He does not use cigarettes or recreational drugs. He does use alcohol occasionally.   Social Determinants of Health   Financial Resource Strain:   . Difficulty of Paying Living Expenses: Not on file  Food Insecurity:   . Worried About Charity fundraiser in the Last Year: Not on file  . Ran Out of Food in the Last Year: Not on file  Transportation Needs:   . Lack of Transportation (Medical): Not on file  . Lack of Transportation (Non-Medical): Not on file  Physical Activity:   . Days of Exercise per Week: Not on file  . Minutes of Exercise per Session: Not on file  Stress:   .  Feeling of Stress : Not on file  Social Connections:   . Frequency of Communication with Friends and Family: Not on file  . Frequency of Social Gatherings with Friends and Family: Not on file  . Attends Religious Services: Not on file  . Active Member of Clubs or Organizations: Not on file  . Attends Archivist Meetings: Not on file  . Marital Status: Not on file  Intimate Partner Violence:   . Fear of Current or Ex-Partner: Not on file  . Emotionally Abused: Not on file  . Physically Abused: Not on file  . Sexually Abused: Not on file    Family History  Problem Relation Age of Onset  . Stroke Mother     ROS: Leg weakness but no fevers or chills,  productive cough, hemoptysis, dysphasia, odynophagia, melena, hematochezia, dysuria, hematuria, rash, seizure activity, orthopnea, PND, pedal edema, claudication. Remaining systems are negative.  Physical Exam: Well-developed well-nourished in no acute distress.  Skin is warm and dry.  HEENT is normal.  Neck is supple.  Chest is clear to auscultation with normal expansion.  Cardiovascular exam is irregular Abdominal exam nontender or distended. No masses palpated. Extremities show no edema. neuro grossly intact  ECG-atrial fibrillation at a rate of 79, no ST changes.  Personally reviewed  A/P  1 permanent atrial fibrillation-patient's heart rate remains controlled on no medications.  Continue apixaban.  2 coronary calcification-patient denies chest pain.  He is not on aspirin given need for apixaban.  We will have most recent lipids forwarded to [place] from Dr. Forde Dandy office.  If LDL greater than 70 would add statin.  3 palpitations-no recent symptoms.  Kirk Ruths, MD

## 2020-08-22 ENCOUNTER — Other Ambulatory Visit: Payer: Self-pay

## 2020-08-22 ENCOUNTER — Ambulatory Visit (INDEPENDENT_AMBULATORY_CARE_PROVIDER_SITE_OTHER): Payer: Medicare Other | Admitting: Cardiology

## 2020-08-22 ENCOUNTER — Encounter: Payer: Self-pay | Admitting: Cardiology

## 2020-08-22 VITALS — BP 118/66 | HR 79 | Ht 73.0 in | Wt 210.0 lb

## 2020-08-22 DIAGNOSIS — I2510 Atherosclerotic heart disease of native coronary artery without angina pectoris: Secondary | ICD-10-CM

## 2020-08-22 DIAGNOSIS — R002 Palpitations: Secondary | ICD-10-CM | POA: Diagnosis not present

## 2020-08-22 DIAGNOSIS — I4821 Permanent atrial fibrillation: Secondary | ICD-10-CM

## 2020-08-22 NOTE — Patient Instructions (Signed)

## 2020-08-24 ENCOUNTER — Telehealth: Payer: Self-pay

## 2020-08-24 DIAGNOSIS — I2510 Atherosclerotic heart disease of native coronary artery without angina pectoris: Secondary | ICD-10-CM | POA: Insufficient documentation

## 2020-08-24 LAB — IFOBT (OCCULT BLOOD): IFOBT: NEGATIVE

## 2020-08-24 NOTE — Telephone Encounter (Signed)
Patient with diagnosis of afib on Eliquis for anticoagulation.    Procedure: colonoscopy Date of procedure: 09/27/20  CHA2DS2-VASc Score = 2  This indicates a 2.2% annual risk of stroke. The patient's score is based upon: CHF History: 0 HTN History: 0 Diabetes History: 0 Stroke History: 0 Vascular Disease History: 1 Age Score: 1 Gender Score: 0   CrCl 66mL/min Platelet count 222K  Per office protocol, patient can hold Eliquis for 2 days prior to procedure as requested.

## 2020-08-24 NOTE — Telephone Encounter (Signed)
   [facility] Pre-operative Risk Assessment    HEARTCARE STAFF: - Please ensure there is not already an duplicate clearance open for this procedure. - Under Visit Info/Reason for Call, type in Other and utilize the format Clearance MM/DD/YY or Clearance TBD. Do not use dashes or single digits. - If request is for dental extraction, please clarify the # of teeth to be extracted.  Request for surgical clearance:  1. What type of surgery is being performed? Coloscopy  2. When is this surgery scheduled? September 27, 2020   3. What type of clearance is required (medical clearance vs. Pharmacy clearance to hold med vs. Both)? medical  4. Are there any medications that need to be held prior to surgery and how long?Eliquis 2 days pre procedure   5. Practice name and name of physician performing surgery? [facility], Dr. Arta Silence   6. What is the office phone number? [phone]   7.   What is the office fax number? [phone]  8.   Anesthesia type (None, local, MAC, general) ? Not Listed   Monia Pouch 08/24/2020, 2:35 PM  _________________________________________________________________   (provider comments below)

## 2020-08-25 NOTE — Telephone Encounter (Signed)
   Primary Cardiologist: Kirk Ruths, MD  Chart reviewed as part of pre-operative protocol coverage. Given past medical history and time since last visit, based on ACC/AHA guidelines, Kyle [name] would be at acceptable risk for the planned procedure without further cardiovascular testing.   Patient with diagnosis of afib on Eliquis for anticoagulation.    Procedure: colonoscopy Date of procedure: 09/27/20  CHA2DS2-VASc Score = 2  This indicates a 2.2% annual risk of stroke. The patient's score is based upon: CHF History: 0 HTN History: 0 Diabetes History: 0 Stroke History: 0 Vascular Disease History: 1 Age Score: 1 Gender Score: 0   CrCl 3mL/min Platelet count 222K  Per office protocol, patient can hold Eliquis for 2 days prior to procedure as requested.     I will route this recommendation to the requesting party via Epic fax function and remove from pre-op pool.  Please call with questions.  [name]. [name] NP-C    08/25/2020, 10:24 AM [address] Office [phone] Fax [phone]

## 2020-09-04 ENCOUNTER — Encounter: Payer: Self-pay | Admitting: *Deleted

## 2020-11-23 ENCOUNTER — Other Ambulatory Visit: Payer: Self-pay | Admitting: Cardiology

## 2020-11-23 DIAGNOSIS — I482 Chronic atrial fibrillation, unspecified: Secondary | ICD-10-CM

## 2020-11-29 DIAGNOSIS — R609 Edema, unspecified: Secondary | ICD-10-CM

## 2020-11-29 NOTE — Telephone Encounter (Signed)
Spoke with pt, he reports that the swelling started Sunday and by Monday morning he was unable to get shoes on. He denies SOB or any other symptoms, he is going to check his bp. He is trying to sit with his legs elevated and today started wearing his compression hose. He wanted to know if dr Stanford Breed thought he needed to do anything else. Aware dr Stanford Breed is out of the office but will forward for his review

## 2020-11-30 MED ORDER — FUROSEMIDE 20 MG PO TABS: 20.0000 mg | ORAL_TABLET | Freq: Every day | ORAL | 6 refills | Status: DC | PRN

## 2020-11-30 NOTE — Telephone Encounter (Signed)
Left detailed message for patient of dr Jacalyn Lefevre recommendations. New script sent to the pharmacy and Lab orders mailed to the pt

## 2020-11-30 NOTE — Telephone Encounter (Signed)
Lasix 20 mg daily for 3 days the daily as needed; bmet one week Kirk Ruths

## 2020-12-12 ENCOUNTER — Encounter: Payer: Self-pay | Admitting: *Deleted

## 2021-01-02 ENCOUNTER — Telehealth: Payer: Self-pay | Admitting: Cardiology

## 2021-01-02 NOTE — Telephone Encounter (Signed)
It is ok for him to get the antibody infusion.- I sent a mychart reply, but feel free to call him back

## 2021-01-02 NOTE — Telephone Encounter (Signed)
Patient was following up on a MyChart message he sent this morning. The patient is scheduled to get a COVID antibody infusion today at 2:00. He just wanted to check in and make sure that was OK. Please advise

## 2021-01-02 NOTE — Telephone Encounter (Signed)
Spoke with pt to insure that he got the [facility] [place] message that it is ok for him to get antibody infusion.  Pt did receive message and will be able to make his appointment. Pt verbalizes understanding.

## 2021-08-24 NOTE — Progress Notes (Signed)
HPI:FU atrial fibrillation. Exercise treadmill in 2007 negative. Abdominal CT January 2013 showed coronary artery calcification but no abdominal aortic aneurysm. Echocardiogram March 2018 showed normal LV systolic function, moderate left atrial enlargement and right atrial enlargement and mild right ventricular enlargement. Holter monitor March 2018 showed atrial fibrillation with PVCs or aberrantly conducted beats with slow rate. Cardizem was discontinued. Since he was last seen he denies dyspnea, chest pain, palpitations or syncope.  No bleeding.  Current Outpatient Medications  Medication Sig Dispense Refill   doxycycline (ORACEA) 40 MG capsule Take 40 mg by mouth 2 (two) times daily. TOTAL 40 MG     ELIQUIS 5 MG TABS tablet TAKE 1 TABLET TWICE A DAY 180 tablet 3   methimazole (TAPAZOLE) 5 MG tablet Take 5 mg by mouth daily.     No current facility-administered medications for this visit.     Past Medical History:  Diagnosis Date   Atrial fibrillation ([place])    History of kidney stones 10-23-2011  SPONTANEOUSLY PASSED   History of migraine    Hyperthyroidism NO MEDS OR [place]   MONITORED BY DR SOUTH   Mild acid reflux OCCASIONAL-  WATCHES DIET   PONV (postoperative nausea and vomiting)    Rosacea     Past Surgical History:  Procedure Laterality Date   CARDIOVERSION  10-15-2007  DR Lia Foyer   NEW ON-SET A-FIB   ELBOW SURGERY     right   ELBOW SURGERY     KNEE ARTHROSCOPY WITH LATERAL MENISECTOMY  08/07/2012   Procedure: KNEE ARTHROSCOPY WITH LATERAL MENISECTOMY;  Surgeon: Magnus Sinning, MD;  Location: [facility];  Service: Orthopedics;  Laterality: Left;  Left Knee Arthroscopy with Partial Lateral Menisectomy    KNEE SURGERY     RIGHT ELBOW SURGERY  1992 (APPROX)   TENDINITIS   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  09-12-2010  DR Dalbert Batman   LEFT LATERAL WALL   VASECTOMY REVERSAL  1990  (APPROX)    Social History   Socioeconomic History    Marital status: Married    Spouse name: Not on file   Number of children: 4   Years of education: Not on file   Highest education level: Not on file  Occupational History   Occupation: retirement Financial controller: [employer]  Tobacco Use   Smoking status: Never   Smokeless tobacco: Never  Vaping Use   Vaping Use: Never used  Substance and Sexual Activity   Alcohol use: Yes    Alcohol/week: 3.0 - 4.0 standard drinks    Types: 3 - 4 Glasses of wine per week    Comment: occasional   Drug use: No   Sexual activity: Not on file  Other Topics Concern   Not on file  Social History Narrative   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 He does not use cigarettes or recreational drugs. He does use alcohol occasionally.   Social Determinants of Health   Financial Resource Strain: Not on file  Food Insecurity: Not on file  Transportation Needs: Not on file  Physical Activity: Not on file  Stress: Not on file  Social Connections: Not on file  Intimate Partner Violence: Not on file    Family History  Problem Relation Age of Onset   Stroke Mother     ROS: no fevers or chills, productive cough, hemoptysis, dysphasia, odynophagia, melena, hematochezia, dysuria, hematuria, rash, seizure activity, orthopnea, PND,  pedal edema, claudication. Remaining systems are negative.  Physical Exam: Well-developed well-nourished in no acute distress.  Skin is warm and dry.  HEENT is normal.  Neck is supple.  Chest is clear to auscultation with normal expansion.  Cardiovascular exam is irregular Abdominal exam nontender or distended. No masses palpated. Extremities show no edema. neuro grossly intact  ECG-atrial fibrillation at a rate of 62, normal axis, no ST changes.  Personally reviewed  A/P  1 permanent atrial fibrillation-we will continue apixaban at present dose.  Patient's heart rate is controlled on no medications.  Renal function and  hemoglobin monitored by primary care.  2 coronary calcification-patient continues to do well with no chest pain.  He is not on aspirin given need for anticoagulation.  We will add Crestor 20 mg daily.  Check lipids and liver in 8 weeks.  3 palpitations-patient denies recent symptoms.  Kirk Ruths, MD

## 2021-08-31 ENCOUNTER — Encounter: Payer: Self-pay | Admitting: Cardiology

## 2021-08-31 ENCOUNTER — Ambulatory Visit (INDEPENDENT_AMBULATORY_CARE_PROVIDER_SITE_OTHER): Payer: Medicare Other | Admitting: Cardiology

## 2021-08-31 ENCOUNTER — Other Ambulatory Visit: Payer: Self-pay

## 2021-08-31 ENCOUNTER — Other Ambulatory Visit: Payer: Self-pay | Admitting: *Deleted

## 2021-08-31 VITALS — BP 124/72 | HR 62 | Ht 73.0 in | Wt 219.0 lb

## 2021-08-31 DIAGNOSIS — I4821 Permanent atrial fibrillation: Secondary | ICD-10-CM | POA: Diagnosis not present

## 2021-08-31 DIAGNOSIS — I2510 Atherosclerotic heart disease of native coronary artery without angina pectoris: Secondary | ICD-10-CM | POA: Diagnosis not present

## 2021-08-31 DIAGNOSIS — I2584 Coronary atherosclerosis due to calcified coronary lesion: Secondary | ICD-10-CM

## 2021-08-31 DIAGNOSIS — R002 Palpitations: Secondary | ICD-10-CM | POA: Diagnosis not present

## 2021-08-31 MED ORDER — ROSUVASTATIN CALCIUM 20 MG PO TABS: 20.0000 mg | ORAL_TABLET | Freq: Every day | ORAL | 3 refills | Status: DC

## 2021-08-31 NOTE — Telephone Encounter (Signed)
Called Crestor 20 mg to [facility], left voicemail  #14 day supply with 3 refills until he gets mail order RX

## 2021-08-31 NOTE — Patient Instructions (Signed)
Medication Instructions:  START ROSUVASTATIN 20 MG DAILY   *If you need a refill on your cardiac medications before your next appointment, please call your pharmacy*  Lab Work: [place] IN January   If you have labs (blood work) drawn today and your tests are completely normal, you will receive your results only by: [place] (if you have MyChart) OR A paper copy in the mail If you have any lab test that is abnormal or we need to change your treatment, we will call you to review the results.  Testing/Procedures: NONE  Follow-Up: At [facility], you and your health needs are our priority.  As part of our continuing mission to provide you with exceptional heart care, we have created designated Provider Care Teams.  These Care Teams include your primary Cardiologist (physician) and Advanced Practice Providers (APPs -  Physician Assistants and Nurse Practitioners) who all work together to provide you with the care you need, when you need it.  We recommend signing up for the patient portal called "MyChart".  Sign up information is provided on this After Visit Summary.  MyChart is used to connect with patients for Virtual Visits (Telemedicine).  Patients are able to view lab/test results, encounter notes, upcoming appointments, etc.  Non-urgent messages can be sent to your provider as well.   To learn more about what you can do with MyChart, go to [email or web address].    Your next appointment:   12 month(s)  The format for your next appointment:   In Person  Provider:   Kirk Ruths, MD

## 2021-11-19 ENCOUNTER — Other Ambulatory Visit: Payer: Self-pay | Admitting: Cardiology

## 2021-11-19 DIAGNOSIS — I482 Chronic atrial fibrillation, unspecified: Secondary | ICD-10-CM

## 2021-11-19 NOTE — Telephone Encounter (Signed)
Prescription refill request for Eliquis received. Indication:Afib Last office visit:12/22 Scr:0.9 Age: 76 Weight:99.3 kg  Prescription refilled

## 2021-11-21 ENCOUNTER — Encounter: Payer: Self-pay | Admitting: Cardiology

## 2021-11-22 ENCOUNTER — Other Ambulatory Visit: Payer: Self-pay | Admitting: *Deleted

## 2021-11-22 DIAGNOSIS — I2510 Atherosclerotic heart disease of native coronary artery without angina pectoris: Secondary | ICD-10-CM

## 2022-01-21 LAB — LIPID PANEL
Chol/HDL Ratio: 2.8 ratio (ref 0.0–5.0)
Cholesterol, Total: 77 mg/dL — ABNORMAL LOW (ref 100–199)
HDL: 28 mg/dL — ABNORMAL LOW (ref >39)
LDL Chol Calc (NIH): 35 mg/dL (ref 0–99)
Triglycerides: 55 mg/dL (ref 0–149)
VLDL Cholesterol Cal: 14 mg/dL (ref 5–40)

## 2022-01-21 LAB — HEPATIC FUNCTION PANEL
ALT: 23 IU/L (ref 0–44)
AST: 22 IU/L (ref 0–40)
Albumin: 4.1 g/dL (ref 3.7–4.7)
Alkaline Phosphatase: 67 IU/L (ref 44–121)
Bilirubin Total: 1 mg/dL (ref 0.0–1.2)
Bilirubin, Direct: 0.27 mg/dL (ref 0.00–0.40)
Total Protein: 6.1 g/dL (ref 6.0–8.5)

## 2022-02-06 ENCOUNTER — Encounter: Payer: Self-pay | Admitting: Cardiology

## 2022-02-06 DIAGNOSIS — R609 Edema, unspecified: Secondary | ICD-10-CM

## 2022-02-07 MED ORDER — FUROSEMIDE 20 MG PO TABS: 20.0000 mg | ORAL_TABLET | ORAL | 2 refills | Status: DC | PRN

## 2022-02-14 ENCOUNTER — Other Ambulatory Visit: Payer: Self-pay

## 2022-02-14 DIAGNOSIS — R609 Edema, unspecified: Secondary | ICD-10-CM

## 2022-02-14 LAB — BASIC METABOLIC PANEL
BUN/Creatinine Ratio: 19 (ref 10–24)
BUN: 18 mg/dL (ref 8–27)
CO2: 28 mmol/L (ref 20–29)
Calcium: 9.3 mg/dL (ref 8.6–10.2)
Chloride: 103 mmol/L (ref 96–106)
Creatinine, Ser: 0.95 mg/dL (ref 0.76–1.27)
Glucose: 93 mg/dL (ref 70–99)
Potassium: 4.7 mmol/L (ref 3.5–5.2)
Sodium: 142 mmol/L (ref 134–144)
eGFR: 84 mL/min/{1.73_m2} (ref >59)

## 2022-03-24 ENCOUNTER — Emergency Department (HOSPITAL_BASED_OUTPATIENT_CLINIC_OR_DEPARTMENT_OTHER)
Admission: EM | Admit: 2022-03-24 | Discharge: 2022-03-24 | Disposition: A | Discharge status: Home / Self Care | Payer: Medicare Other | Attending: Emergency Medicine | Admitting: Emergency Medicine

## 2022-03-24 ENCOUNTER — Other Ambulatory Visit: Payer: Self-pay

## 2022-03-24 DIAGNOSIS — W268XXA Contact with other sharp object(s), not elsewhere classified, initial encounter: Secondary | ICD-10-CM | POA: Diagnosis not present

## 2022-03-24 DIAGNOSIS — S6992XA Unspecified injury of left wrist, hand and finger(s), initial encounter: Secondary | ICD-10-CM | POA: Diagnosis present

## 2022-03-24 DIAGNOSIS — T148XXA Other injury of unspecified body region, initial encounter: Secondary | ICD-10-CM

## 2022-03-24 DIAGNOSIS — Z23 Encounter for immunization: Secondary | ICD-10-CM | POA: Diagnosis not present

## 2022-03-24 DIAGNOSIS — S61032A Puncture wound without foreign body of left thumb without damage to nail, initial encounter: Secondary | ICD-10-CM | POA: Insufficient documentation

## 2022-03-24 DIAGNOSIS — Z7901 Long term (current) use of anticoagulants: Secondary | ICD-10-CM | POA: Insufficient documentation

## 2022-03-24 MED ORDER — TETANUS-DIPHTH-ACELL PERTUSSIS 5-2.5-18.5 LF-MCG/0.5 IM SUSY
0.5000 mL | PREFILLED_SYRINGE | Freq: Once | INTRAMUSCULAR | Status: AC
  Administered 2022-03-24: 0.5 mL via INTRAMUSCULAR
  Filled 2022-03-24: qty 0.5

## 2022-03-24 MED ORDER — SULFAMETHOXAZOLE-TRIMETHOPRIM 800-160 MG PO TABS: 1.0000 | ORAL_TABLET | Freq: Two times a day (BID) | ORAL | 0 refills | Status: AC

## 2022-03-24 MED ORDER — SULFAMETHOXAZOLE-TRIMETHOPRIM 800-160 MG PO TABS
1.0000 | ORAL_TABLET | Freq: Once | ORAL | Status: AC
  Administered 2022-03-24: 1 via ORAL
  Filled 2022-03-24: qty 1

## 2022-03-24 NOTE — ED Triage Notes (Signed)
Patient arrives with complaints of small puncture wound to left hand after grabbing a piece of metal to break his fall (fell back on cardboard box).  Patient rates hand pain a 2/10. Patient does not remember his last tetanus shot. Scant bleeding at site.

## 2022-03-24 NOTE — ED Provider Notes (Signed)
[facility] EMERGENCY DEPT Provider Note   CSN: [id number] Arrival date & time: 03/24/22  1818     History  Chief Complaint  Patient presents with   Hand Injury    Left    Kyle [name] is a 76 y.o. male presenting emerged department complaining of pain in his left hand.  He reports that he punctured his left hand on a sharp piece of metal wire prior to arrival.  He is right-handed.  He has a single injury at the base of his left thumb.  He did rinse it out and washed it.  He is not certain of his last tetanus shot.  He does not think that any fragments broke off under the skin  HPI     Home Medications Prior to Admission medications   Medication Sig Start Date End Date Taking? Authorizing Provider  sulfamethoxazole-trimethoprim (BACTRIM DS) 800-160 MG tablet Take 1 tablet by mouth 2 (two) times daily for 7 days. 03/24/22 03/31/22 Yes Min Collymore, Carola Rhine, MD  doxycycline (ORACEA) 40 MG capsule Take 40 mg by mouth 2 (two) times daily. TOTAL 40 MG    [provider]  ELIQUIS 5 MG TABS tablet TAKE 1 TABLET TWICE A DAY 11/19/21   Lelon Perla, MD  furosemide (LASIX) 20 MG tablet Take 1 tablet (20 mg total) by mouth as needed (lower leg swelling). 02/07/22   Lelon Perla, MD  methimazole (TAPAZOLE) 5 MG tablet Take 5 mg by mouth daily. 08/13/21   [provider]  rosuvastatin (CRESTOR) 20 MG tablet Take 1 tablet (20 mg total) by mouth daily. 08/31/21 11/29/21  Lelon Perla, MD      Allergies    Ampicillin    Review of Systems   Review of Systems  Physical Exam Updated Vital Signs BP (!) 156/78 (BP Location: Right Arm)   Pulse 75   Temp 98.1 F (36.7 C)   Resp 16   SpO2 97%  Physical Exam Constitutional:      General: He is not in acute distress. HENT:     Head: Normocephalic and atraumatic.  Eyes:     Conjunctiva/sclera: Conjunctivae normal.     Pupils: Pupils are equal, round, and reactive to light.  Cardiovascular:     Rate and  Rhythm: Normal rate and regular rhythm.  Pulmonary:     Effort: Pulmonary effort is normal. No respiratory distress.  Musculoskeletal:     Comments: Puncture wound at the base of the left thumb, palmar aspect, with some mild surrounding ecchymoses and erythema  Skin:    General: Skin is warm and dry.  Neurological:     General: No focal deficit present.     Mental Status: He is alert. Mental status is at baseline.     Comments: No motor or sensory deficits of the upper extremity  Psychiatric:        Mood and Affect: Mood normal.        Behavior: Behavior normal.     ED Results / Procedures / Treatments   Labs (all labs ordered are listed, but only abnormal results are displayed) Labs Reviewed - No data to display  EKG None  Radiology No results found.  Procedures Procedures    Medications Ordered in ED Medications  Tdap (BOOSTRIX) injection 0.5 mL (0.5 mLs Intramuscular Given 03/24/22 1907)  sulfamethoxazole-trimethoprim (BACTRIM DS) 800-160 MG per tablet 1 tablet (1 tablet Oral Given 03/24/22 1906)    ED Course/ Medical Decision Making/ A&P  Medical Decision Making Risk Prescription drug management.   Patient is here with a puncture wound to the base of the left first finger.  Wound has some surrounding erythema with some ecchymoses may be related to bleeding (the patient is on Eliquis).  However no purulence.  No motor deficits.  No sensory deficits.  I doubt vascular arterial injury.  Given concern for the depth of the wound and puncture wound, I think antibiotics are reasonable, he is on chronic doxycycline for rosacea, but we can do Bactrim for 7 days, and also update his tetanus as he is not certain of the last tetanus.  I do not believe we need emergent x-ray imaging, as I do not feel any retained foreign bodies, and he does not think any fragments broke off.        Final Clinical Impression(s) / ED Diagnoses Final diagnoses:   Puncture wound    Rx / DC Orders ED Discharge Orders          Ordered    sulfamethoxazole-trimethoprim (BACTRIM DS) 800-160 MG tablet  2 times daily        03/24/22 1858              Wyvonnia Dusky, MD 03/25/22 [phone]

## 2022-03-24 NOTE — ED Notes (Addendum)
Pt is on eliquis. Pt sat backwards on a box and hands went back into a metal wire> Pt denis any other injuries and states he did not hit head and he is fine.

## 2022-03-24 NOTE — ED Notes (Signed)
Pt verbalizes understanding of discharge instructions. Opportunity for questioning and answers were provided. Pt discharged from ED to home with wife.    

## 2022-04-04 ENCOUNTER — Other Ambulatory Visit (HOSPITAL_COMMUNITY): Payer: Self-pay | Admitting: Endocrinology

## 2022-04-04 DIAGNOSIS — I25119 Atherosclerotic heart disease of native coronary artery with unspecified angina pectoris: Secondary | ICD-10-CM

## 2022-04-05 ENCOUNTER — Ambulatory Visit (HOSPITAL_COMMUNITY): Payer: Medicare Other | Attending: Endocrinology

## 2022-04-05 DIAGNOSIS — I25119 Atherosclerotic heart disease of native coronary artery with unspecified angina pectoris: Secondary | ICD-10-CM | POA: Insufficient documentation

## 2022-04-05 LAB — ECHOCARDIOGRAM COMPLETE: S' Lateral: 1.8 cm

## 2022-05-05 ENCOUNTER — Other Ambulatory Visit: Payer: Self-pay | Admitting: Cardiology

## 2022-09-30 ENCOUNTER — Other Ambulatory Visit: Payer: Self-pay | Admitting: Cardiology

## 2022-10-28 NOTE — Progress Notes (Signed)
HPI: FU atrial fibrillation. Exercise treadmill in 2007 negative. Abdominal CT January 2013 showed coronary artery calcification but no abdominal aortic aneurysm. Holter monitor March 2018 showed atrial fibrillation with PVCs or aberrantly conducted beats with slow rate. Cardizem was discontinued.  Echocardiogram July 2023 showed normal LV function, mild left ventricular hypertrophy, mild right ventricular enlargement, severe biatrial enlargement, mild to moderate tricuspid regurgitation.  Since he was last seen he denies dyspnea, chest pain, palpitations, syncope or bleeding.  Current Outpatient Medications  Medication Sig Dispense Refill   doxycycline (ORACEA) 40 MG capsule Take 40 mg by mouth 2 (two) times daily. TOTAL 40 MG     ELIQUIS 5 MG TABS tablet TAKE 1 TABLET TWICE A DAY 180 tablet 3   furosemide (LASIX) 20 MG tablet TAKE 1 TABLET (20 MG TOTAL) BY MOUTH AS NEEDED (LOWER LEG SWELLING). 90 tablet 3   rosuvastatin (CRESTOR) 20 MG tablet Take 1 tablet (20 mg total) by mouth daily. SCHEDULE OFFICE VISIT FOR FUTURE REFILLS. 30 tablet 1   No current facility-administered medications for this visit.     Past Medical History:  Diagnosis Date   Atrial fibrillation ([place])    History of kidney stones 10-23-2011  SPONTANEOUSLY PASSED   History of migraine    Hyperthyroidism NO MEDS OR [place]   MONITORED BY DR SOUTH   Mild acid reflux OCCASIONAL-  WATCHES DIET   PONV (postoperative nausea and vomiting)    Rosacea     Past Surgical History:  Procedure Laterality Date   CARDIOVERSION  10-15-2007  DR Lia Foyer   NEW ON-SET A-FIB   ELBOW SURGERY     right   ELBOW SURGERY     KNEE ARTHROSCOPY WITH LATERAL MENISECTOMY  08/07/2012   Procedure: KNEE ARTHROSCOPY WITH LATERAL MENISECTOMY;  Surgeon: Magnus Sinning, MD;  Location: [facility];  Service: Orthopedics;  Laterality: Left;  Left Knee Arthroscopy with Partial Lateral Menisectomy    KNEE SURGERY     RIGHT ELBOW  SURGERY  1992 (APPROX)   TENDINITIS   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  09-12-2010  DR Dalbert Batman   LEFT LATERAL WALL   VASECTOMY REVERSAL  1990  (APPROX)    Social History   Socioeconomic History   Marital status: Married    Spouse name: Not on file   Number of children: 4   Years of education: Not on file   Highest education level: Not on file  Occupational History   Occupation: retirement Financial controller: [employer]  Tobacco Use   Smoking status: Never   Smokeless tobacco: Never  Vaping Use   Vaping Use: Never used  Substance and Sexual Activity   Alcohol use: Yes    Alcohol/week: 3.0 - 4.0 standard drinks of alcohol    Types: 3 - 4 Glasses of wine per week    Comment: occasional   Drug use: No   Sexual activity: Not on file  Other Topics Concern   Not on file  Social History Narrative   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 He does not use cigarettes or recreational drugs. He does use alcohol occasionally.   Social Determinants of Health   Financial Resource Strain: Not on file  Food Insecurity: Not on file  Transportation Needs: Not on file  Physical Activity: Not on file  Stress: Not on file  Social Connections: Not on file  Intimate Partner Violence: Not on file  Family History  Problem Relation Age of Onset   Stroke Mother     ROS: no fevers or chills, productive cough, hemoptysis, dysphasia, odynophagia, melena, hematochezia, dysuria, hematuria, rash, seizure activity, orthopnea, PND, pedal edema, claudication. Remaining systems are negative.  Physical Exam: Well-developed well-nourished in no acute distress.  Skin is warm and dry.  HEENT is normal.  Neck is supple.  Chest is clear to auscultation with normal expansion.  Cardiovascular exam is irregular Abdominal exam nontender or distended. No masses palpated. Extremities show no edema. neuro grossly intact  ECG-atrial fibrillation  at a rate of 60, no ST changes.  Personally reviewed  A/P  1 permanent atrial fibrillation-patient's heart rate remains controlled on no medications.  Will continue apixaban.  2 coronary calcification-continue statin.  Patient is not on aspirin given need for apixaban.  3 palpitations-no recent symptoms.  We will consider monitor in the future if necessary.  Kirk Ruths, MD

## 2022-11-11 ENCOUNTER — Encounter: Payer: Self-pay | Admitting: Cardiology

## 2022-11-11 ENCOUNTER — Ambulatory Visit (INDEPENDENT_AMBULATORY_CARE_PROVIDER_SITE_OTHER): Payer: Medicare Other | Admitting: Cardiology

## 2022-11-11 VITALS — BP 118/73 | HR 60 | Ht 73.0 in | Wt 222.8 lb

## 2022-11-11 DIAGNOSIS — R002 Palpitations: Secondary | ICD-10-CM | POA: Diagnosis not present

## 2022-11-11 DIAGNOSIS — I4821 Permanent atrial fibrillation: Secondary | ICD-10-CM | POA: Diagnosis not present

## 2022-11-11 DIAGNOSIS — I2510 Atherosclerotic heart disease of native coronary artery without angina pectoris: Secondary | ICD-10-CM

## 2022-11-11 DIAGNOSIS — I2584 Coronary atherosclerosis due to calcified coronary lesion: Secondary | ICD-10-CM | POA: Diagnosis not present

## 2022-11-11 NOTE — Patient Instructions (Signed)
    Follow-Up: At [facility], you and your health needs are our priority.  As part of our continuing mission to provide you with exceptional heart care, we have created designated Provider Care Teams.  These Care Teams include your primary Cardiologist (physician) and Advanced Practice Providers (APPs -  Physician Assistants and Nurse Practitioners) who all work together to provide you with the care you need, when you need it.  We recommend signing up for the patient portal called "MyChart".  Sign up information is provided on this After Visit Summary.  MyChart is used to connect with patients for Virtual Visits (Telemedicine).  Patients are able to view lab/test results, encounter notes, upcoming appointments, etc.  Non-urgent messages can be sent to your provider as well.   To learn more about what you can do with MyChart, go to [email or web address].    Your next appointment:   12 month(s)  Provider:   Brian Crenshaw, MD     

## 2023-01-09 ENCOUNTER — Other Ambulatory Visit: Payer: Self-pay | Admitting: Cardiology

## 2023-01-09 DIAGNOSIS — I482 Chronic atrial fibrillation, unspecified: Secondary | ICD-10-CM

## 2023-01-09 NOTE — Telephone Encounter (Signed)
Prescription refill request for Eliquis received. Indication: afib  Last office visit: Crenshaw, 11/11/2022 Scr: 0.95, 02/14/2022 Age: 77 yo  Weight: 101 kg   Refill sent.

## 2023-04-14 ENCOUNTER — Telehealth: Payer: Self-pay | Admitting: Cardiology

## 2023-04-14 NOTE — Telephone Encounter (Signed)
*  STAT* If patient is at the pharmacy, call can be transferred to refill team.   1. Which medications need to be refilled? (please list name of each medication and dose if known) rosuvastatin (CRESTOR) 20 MG tablet  2. Which pharmacy/location (including street and city if local pharmacy) is medication to be sent to? [facility] - [place], [place] - [address]  3. Do they need a 30 day or 90 day supply?  90 day supply  Patient states he is almost out of medication.

## 2023-04-14 NOTE — Telephone Encounter (Signed)
Returned call to pt, he has been made aware that [facility] has a prescription that was sent 12/2022 #90 with 2 refills.  Pt will call them back and will let us know if he needs further assistance.  He was very grateful for the call back.

## 2023-05-02 NOTE — Progress Notes (Signed)
HPI: FU atrial fibrillation. Exercise treadmill in 2007 negative. Abdominal CT January 2013 showed coronary artery calcification but no abdominal aortic aneurysm. Holter monitor March 2018 showed atrial fibrillation with PVCs or aberrantly conducted beats with slow rate. Cardizem was discontinued.  Echocardiogram July 2023 showed normal LV function, mild left ventricular hypertrophy, mild right ventricular enlargement, severe biatrial enlargement, mild to moderate tricuspid regurgitation.  Since he was last seen the patient has dyspnea with more extreme activities but not with routine activities. It is relieved with rest. It is not associated with chest pain. There is no orthopnea, PND or pedal edema. There is no syncope or palpitations. There is no exertional chest pain.   Current Outpatient Medications  Medication Sig Dispense Refill   methimazole (TAPAZOLE) 10 MG tablet Take 10 mg by mouth daily.     apixaban (ELIQUIS) 5 MG TABS tablet TAKE 1 TABLET TWICE A DAY 180 tablet 1   doxycycline (ORACEA) 40 MG capsule Take 40 mg by mouth 2 (two) times daily. TOTAL 40 MG     furosemide (LASIX) 20 MG tablet TAKE 1 TABLET (20 MG TOTAL) BY MOUTH AS NEEDED (LOWER LEG SWELLING). 90 tablet 3   rosuvastatin (CRESTOR) 20 MG tablet TAKE 1 TABLET DAILY. SCHEDULE OFFICE VISIT FOR FUTURE REFILLS 90 tablet 2   No current facility-administered medications for this visit.     Past Medical History:  Diagnosis Date   Atrial fibrillation ([facility])    History of kidney stones 10-23-2011  SPONTANEOUSLY PASSED   History of migraine    Hyperthyroidism NO MEDS OR [place]   MONITORED BY DR SOUTH   Mild acid reflux OCCASIONAL-  WATCHES DIET   PONV (postoperative nausea and vomiting)    Rosacea     Past Surgical History:  Procedure Laterality Date   CARDIOVERSION  10-15-2007  DR Riley Kill   NEW ON-SET A-FIB   ELBOW SURGERY     right   ELBOW SURGERY     KNEE ARTHROSCOPY WITH LATERAL MENISECTOMY  08/07/2012    Procedure: KNEE ARTHROSCOPY WITH LATERAL MENISECTOMY;  Surgeon: Drucilla Schmidt, MD;  Location: [facility];  Service: Orthopedics;  Laterality: Left;  Left Knee Arthroscopy with Partial Lateral Menisectomy    KNEE SURGERY     RIGHT ELBOW SURGERY  1992 (APPROX)   TENDINITIS   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  09-12-2010  DR Derrell Lolling   LEFT LATERAL WALL   VASECTOMY REVERSAL  1990  (APPROX)    Social History   Socioeconomic History   Marital status: Married    Spouse name: Not on file   Number of children: 4   Years of education: Not on file   Highest education level: Not on file  Occupational History   Occupation: retirement Copywriter, advertising: [employer]  Tobacco Use   Smoking status: Never   Smokeless tobacco: Never  Vaping Use   Vaping status: Never Used  Substance and Sexual Activity   Alcohol use: Yes    Alcohol/week: 3.0 - 4.0 standard drinks of alcohol    Types: 3 - 4 Glasses of wine per week    Comment: occasional   Drug use: No   Sexual activity: Not on file  Other Topics Concern   Not on file  Social History Narrative   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 He does not use cigarettes or recreational drugs. He does use alcohol occasionally.   Social  Determinants of Health   Financial Resource Strain: Not on file  Food Insecurity: Not on file  Transportation Needs: Not on file  Physical Activity: Not on file  Stress: Not on file  Social Connections: Not on file  Intimate Partner Violence: Not on file    Family History  Problem Relation Age of Onset   Stroke Mother     ROS: no fevers or chills, productive cough, hemoptysis, dysphasia, odynophagia, melena, hematochezia, dysuria, hematuria, rash, seizure activity, orthopnea, PND, pedal edema, claudication. Remaining systems are negative.  Physical Exam: Well-developed well-nourished in no acute distress.  Skin is warm and dry.   HEENT is normal.  Neck is supple.  Chest is clear to auscultation with normal expansion.  Cardiovascular exam is irregular Abdominal exam nontender or distended. No masses palpated. Extremities show no edema. neuro grossly intact   A/P  1 permanent atrial fibrillation-patient's heart rate is controlled on no medications.  Continue anticoagulation with apixaban.  Will have most recent hemoglobin and renal function forwarded to [place] from primary care.  2 coronary calcification-continue statin.  Patient denies chest pain.  3 history of palpitations-no recent symptoms.  4 hyperlipidemia-continue statin.  Will have most recent lipids and liver forwarded to [place] from primary care.  Olga Millers, MD

## 2023-05-12 ENCOUNTER — Ambulatory Visit: Payer: Medicare Other | Admitting: Cardiology

## 2023-05-12 ENCOUNTER — Encounter: Payer: Self-pay | Admitting: Cardiology

## 2023-05-12 VITALS — BP 110/60 | HR 60 | Ht 73.0 in | Wt 202.0 lb

## 2023-05-12 DIAGNOSIS — E7800 Pure hypercholesterolemia, unspecified: Secondary | ICD-10-CM | POA: Diagnosis present

## 2023-05-12 DIAGNOSIS — I4821 Permanent atrial fibrillation: Secondary | ICD-10-CM | POA: Diagnosis present

## 2023-05-12 DIAGNOSIS — I2510 Atherosclerotic heart disease of native coronary artery without angina pectoris: Secondary | ICD-10-CM | POA: Insufficient documentation

## 2023-05-12 DIAGNOSIS — I2584 Coronary atherosclerosis due to calcified coronary lesion: Secondary | ICD-10-CM | POA: Diagnosis present

## 2023-05-12 NOTE — Patient Instructions (Signed)

## 2023-06-11 ENCOUNTER — Emergency Department (HOSPITAL_BASED_OUTPATIENT_CLINIC_OR_DEPARTMENT_OTHER): Payer: Medicare Other

## 2023-06-11 ENCOUNTER — Other Ambulatory Visit (HOSPITAL_BASED_OUTPATIENT_CLINIC_OR_DEPARTMENT_OTHER): Payer: Self-pay

## 2023-06-11 ENCOUNTER — Encounter (HOSPITAL_BASED_OUTPATIENT_CLINIC_OR_DEPARTMENT_OTHER): Payer: Self-pay | Admitting: Emergency Medicine

## 2023-06-11 ENCOUNTER — Inpatient Hospital Stay (HOSPITAL_BASED_OUTPATIENT_CLINIC_OR_DEPARTMENT_OTHER)
Admission: EM | Admit: 2023-06-11 | Discharge: 2023-06-13 | DRG: 064 | Disposition: A | Discharge status: Home / Self Care | Payer: Medicare Other | Attending: Neurology | Admitting: Neurology

## 2023-06-11 DIAGNOSIS — I611 Nontraumatic intracerebral hemorrhage in hemisphere, cortical: Secondary | ICD-10-CM | POA: Diagnosis present

## 2023-06-11 DIAGNOSIS — I6389 Other cerebral infarction: Secondary | ICD-10-CM | POA: Diagnosis not present

## 2023-06-11 DIAGNOSIS — I610 Nontraumatic intracerebral hemorrhage in hemisphere, subcortical: Principal | ICD-10-CM | POA: Diagnosis present

## 2023-06-11 DIAGNOSIS — L719 Rosacea, unspecified: Secondary | ICD-10-CM | POA: Diagnosis present

## 2023-06-11 DIAGNOSIS — K219 Gastro-esophageal reflux disease without esophagitis: Secondary | ICD-10-CM | POA: Diagnosis present

## 2023-06-11 DIAGNOSIS — G936 Cerebral edema: Secondary | ICD-10-CM | POA: Diagnosis present

## 2023-06-11 DIAGNOSIS — E039 Hypothyroidism, unspecified: Secondary | ICD-10-CM | POA: Diagnosis present

## 2023-06-11 DIAGNOSIS — E785 Hyperlipidemia, unspecified: Secondary | ICD-10-CM | POA: Diagnosis present

## 2023-06-11 DIAGNOSIS — Z7901 Long term (current) use of anticoagulants: Secondary | ICD-10-CM | POA: Diagnosis not present

## 2023-06-11 DIAGNOSIS — G43909 Migraine, unspecified, not intractable, without status migrainosus: Secondary | ICD-10-CM | POA: Diagnosis present

## 2023-06-11 DIAGNOSIS — Z1152 Encounter for screening for COVID-19: Secondary | ICD-10-CM

## 2023-06-11 DIAGNOSIS — R29700 NIHSS score 0: Secondary | ICD-10-CM | POA: Diagnosis present

## 2023-06-11 DIAGNOSIS — Z823 Family history of stroke: Secondary | ICD-10-CM

## 2023-06-11 DIAGNOSIS — I4891 Unspecified atrial fibrillation: Secondary | ICD-10-CM | POA: Diagnosis present

## 2023-06-11 DIAGNOSIS — Z79899 Other long term (current) drug therapy: Secondary | ICD-10-CM | POA: Diagnosis not present

## 2023-06-11 DIAGNOSIS — I629 Nontraumatic intracranial hemorrhage, unspecified: Secondary | ICD-10-CM

## 2023-06-11 DIAGNOSIS — I10 Essential (primary) hypertension: Secondary | ICD-10-CM | POA: Diagnosis present

## 2023-06-11 DIAGNOSIS — I619 Nontraumatic intracerebral hemorrhage, unspecified: Secondary | ICD-10-CM | POA: Diagnosis present

## 2023-06-11 DIAGNOSIS — Z800 Family history of malignant neoplasm of digestive organs: Secondary | ICD-10-CM | POA: Diagnosis not present

## 2023-06-11 DIAGNOSIS — Z86018 Personal history of other benign neoplasm: Secondary | ICD-10-CM

## 2023-06-11 DIAGNOSIS — E0590 Thyrotoxicosis, unspecified without thyrotoxic crisis or storm: Secondary | ICD-10-CM | POA: Diagnosis present

## 2023-06-11 DIAGNOSIS — I482 Chronic atrial fibrillation, unspecified: Secondary | ICD-10-CM | POA: Diagnosis not present

## 2023-06-11 DIAGNOSIS — D6832 Hemorrhagic disorder due to extrinsic circulating anticoagulants: Secondary | ICD-10-CM | POA: Diagnosis present

## 2023-06-11 LAB — RESP PANEL BY RT-PCR (RSV, FLU A&B, COVID)  RVPGX2
Influenza A by PCR: NEGATIVE
Influenza B by PCR: NEGATIVE
Resp Syncytial Virus by PCR: NEGATIVE
SARS Coronavirus 2 by RT PCR: NEGATIVE

## 2023-06-11 LAB — CBC
HCT: 43.6 % (ref 39.0–52.0)
Hemoglobin: 15.1 g/dL (ref 13.0–17.0)
MCH: 31.7 pg (ref 26.0–34.0)
MCHC: 34.6 g/dL (ref 30.0–36.0)
MCV: 91.6 fL (ref 80.0–100.0)
Platelets: 177 10*3/uL (ref 150–400)
RBC: 4.76 MIL/uL (ref 4.22–5.81)
RDW: 15.4 % (ref 11.5–15.5)
WBC: 9 10*3/uL (ref 4.0–10.5)
nRBC: 0 % (ref 0.0–0.2)

## 2023-06-11 LAB — BASIC METABOLIC PANEL WITH GFR
Anion gap: 7 (ref 5–15)
BUN: 15 mg/dL (ref 8–23)
CO2: 27 mmol/L (ref 22–32)
Calcium: 8.6 mg/dL — ABNORMAL LOW (ref 8.9–10.3)
Chloride: 106 mmol/L (ref 98–111)
Creatinine, Ser: 1.15 mg/dL (ref 0.61–1.24)
GFR, Estimated: >60 mL/min (ref >60)
Glucose, Bld: 92 mg/dL (ref 70–99)
Potassium: 4.4 mmol/L (ref 3.5–5.1)
Sodium: 140 mmol/L (ref 135–145)

## 2023-06-11 LAB — MRSA NEXT GEN BY PCR, NASAL: MRSA by PCR Next Gen: NOT DETECTED

## 2023-06-11 LAB — GLUCOSE, CAPILLARY: Glucose-Capillary: 99 mg/dL (ref 70–99)

## 2023-06-11 MED ORDER — ORAL CARE MOUTH RINSE: 15.0000 mL | OROMUCOSAL | Status: DC | PRN

## 2023-06-11 MED ORDER — ONDANSETRON HCL 4 MG/2ML IJ SOLN
4.0000 mg | Freq: Four times a day (QID) | INTRAMUSCULAR | Status: DC | PRN
  Administered 2023-06-11: 4 mg via INTRAVENOUS
  Filled 2023-06-11: qty 2

## 2023-06-11 MED ORDER — EMPTY CONTAINERS FLEXIBLE MISC
900.0000 mg | Freq: Once | Status: AC
  Administered 2023-06-11: 900 mg via INTRAVENOUS
  Filled 2023-06-11: qty 90

## 2023-06-11 MED ORDER — ACETAMINOPHEN 160 MG/5ML PO SOLN: 650.0000 mg | ORAL | Status: DC | PRN

## 2023-06-11 MED ORDER — METOCLOPRAMIDE HCL 5 MG/ML IJ SOLN
5.0000 mg | Freq: Once | INTRAMUSCULAR | Status: AC
  Administered 2023-06-11: 5 mg via INTRAVENOUS
  Filled 2023-06-11: qty 2

## 2023-06-11 MED ORDER — STERILE WATER FOR INJECTION IJ SOLN
INTRAMUSCULAR | Status: AC
  Filled 2023-06-11: qty 100

## 2023-06-11 MED ORDER — CHLORHEXIDINE GLUCONATE CLOTH 2 % EX PADS
6.0000 | MEDICATED_PAD | Freq: Every day | CUTANEOUS | Status: DC
  Administered 2023-06-11: 6 via TOPICAL

## 2023-06-11 MED ORDER — ACETAMINOPHEN 325 MG PO TABS
650.0000 mg | ORAL_TABLET | ORAL | Status: DC | PRN
  Administered 2023-06-12 – 2023-06-13 (×2): 650 mg via ORAL
  Filled 2023-06-11 (×2): qty 2

## 2023-06-11 MED ORDER — COAG FACT XA INACTIVATED-ZHZO 200 MG IV SOLR
INTRAVENOUS | Status: AC
  Filled 2023-06-11: qty 100

## 2023-06-11 MED ORDER — SENNOSIDES-DOCUSATE SODIUM 8.6-50 MG PO TABS
1.0000 | ORAL_TABLET | Freq: Two times a day (BID) | ORAL | Status: DC
  Filled 2023-06-11: qty 1

## 2023-06-11 MED ORDER — ACETAMINOPHEN 650 MG RE SUPP: 650.0000 mg | RECTAL | Status: DC | PRN

## 2023-06-11 MED ORDER — PANTOPRAZOLE SODIUM 40 MG IV SOLR
40.0000 mg | Freq: Every day | INTRAVENOUS | Status: DC
  Administered 2023-06-11: 40 mg via INTRAVENOUS
  Filled 2023-06-11: qty 10

## 2023-06-11 MED ORDER — ONDANSETRON HCL 4 MG/2ML IJ SOLN: 4.0000 mg | Freq: Four times a day (QID) | INTRAMUSCULAR | Status: DC

## 2023-06-11 MED ORDER — IOHEXOL 350 MG/ML SOLN
100.0000 mL | Freq: Once | INTRAVENOUS | Status: AC | PRN
  Administered 2023-06-11: 75 mL via INTRAVENOUS

## 2023-06-11 MED ORDER — CLEVIDIPINE BUTYRATE 0.5 MG/ML IV EMUL: 0.0000 mg/h | INTRAVENOUS | Status: DC

## 2023-06-11 MED ORDER — STROKE: EARLY STAGES OF RECOVERY BOOK
Freq: Once | Status: AC
  Filled 2023-06-11: qty 1

## 2023-06-11 NOTE — ED Provider Notes (Signed)
[place] EMERGENCY DEPARTMENT AT [facility] Provider Note   CSN: [id number] Arrival date & time: 06/11/23  1022     History  Chief Complaint  Patient presents with   Headache    JERRETT DURLING is a 77 y.o. male.  77 year old male with past medical history of atrial fibrillation on Eliquis as well as rosacea presenting to the emergency department today with headache.  The patient states that he has been having a right-sided headache now for the past few days.  He states is also having some discomfort in his neck.  The patient's wife states that he has been more tired than normal.  He was feeling unsteady when he stood up today which is why they brought him to the emergency department for further evaluation.  The patient has been afebrile.  Denies any other infectious symptoms.  He states he does have a history of migraine headaches but this does feel different.  He denies any visual changes with this and has not had any jaw claudication.  He states that this came on 2 days ago and is mostly in the right parietal region.  It has gradually worsened during that time.  He is having pain mostly in the right side of his neck.   Headache      Home Medications Prior to Admission medications   Medication Sig Start Date End Date Taking? Authorizing Provider  apixaban (ELIQUIS) 5 MG TABS tablet TAKE 1 TABLET TWICE A DAY 01/09/23  Yes Crenshaw, Madolyn Frieze, MD  doxycycline (PERIOSTAT) 20 MG tablet Take 20 mg by mouth 2 (two) times daily. 05/23/23  Yes [provider]  methimazole (TAPAZOLE) 10 MG tablet Take 10 mg by mouth daily. 04/11/23  Yes [provider]  rosuvastatin (CRESTOR) 20 MG tablet TAKE 1 TABLET DAILY. SCHEDULE OFFICE VISIT FOR FUTURE REFILLS 01/09/23  Yes Lewayne Bunting, MD  furosemide (LASIX) 20 MG tablet TAKE 1 TABLET (20 MG TOTAL) BY MOUTH AS NEEDED (LOWER LEG SWELLING). Patient not taking: Reported on 06/11/2023 05/06/22   Lewayne Bunting, MD       Allergies    Ampicillin    Review of Systems   Review of Systems  Neurological:  Positive for headaches.  All other systems reviewed and are negative.   Physical Exam Updated Vital Signs BP (!) 150/84   Pulse (!) 53   Temp 98.8 F (37.1 C) (Oral)   Resp 19   SpO2 100%  Physical Exam Vitals and nursing note reviewed.   Gen: NAD Eyes: PERRL, EOMI HEENT: no oropharyngeal swelling Neck: trachea midline, no meningismus Resp: clear to auscultation bilaterally Card: RRR, no murmurs, rubs, or gallops Abd: nontender, nondistended Extremities: no calf tenderness, no edema Vascular: 2+ radial pulses bilaterally, 2+ DP pulses bilaterally Neuro: Cranial nerves intact, equal strength and sensation throughout bilateral upper and lower extremities with no dysmetria on finger-to-nose testing Skin: no rashes Psyc: acting appropriately   ED Results / Procedures / Treatments   Labs (all labs ordered are listed, but only abnormal results are displayed) Labs Reviewed  BASIC METABOLIC PANEL - Abnormal; Notable for the following components:      Result Value   Calcium 8.6 (*)    All other components within normal limits  RESP PANEL BY RT-PCR (RSV, FLU A&B, COVID)  RVPGX2  CBC    EKG EKG Interpretation Date/Time:  Wednesday June 11 2023 11:17:50 EDT Ventricular Rate:  63 PR Interval:    QRS Duration:  86 QT  Interval:  402 QTC Calculation: 412 R Axis:   13  Text Interpretation: Atrial fibrillation Confirmed by Beckey Downing [phone]) on 06/11/2023 11:26:00 AM  Radiology CT ANGIO HEAD NECK W WO CM  Result Date: 06/11/2023 CLINICAL DATA:  Headache and neck pain beginning yesterday. Intracranial hemorrhage. EXAM: CT ANGIOGRAPHY HEAD AND NECK WITH AND WITHOUT CONTRAST TECHNIQUE: Multidetector CT imaging of the head and neck was performed using the standard protocol during bolus administration of intravenous contrast. Multiplanar CT image reconstructions and MIPs were obtained to  evaluate the vascular anatomy. Carotid stenosis measurements (when applicable) are obtained utilizing NASCET criteria, using the distal internal carotid diameter as the denominator. RADIATION DOSE REDUCTION: This exam was performed according to the departmental dose-optimization program which includes automated exposure control, adjustment of the mA and/or kV according to patient size and/or use of iterative reconstruction technique. CONTRAST:  75mL OMNIPAQUE IOHEXOL 350 MG/ML SOLN COMPARISON:  CT head without contrast 06/11/2023 at 12:26 p.m. FINDINGS: CT HEAD FINDINGS Brain: Acute hemorrhage in the anterior inferior right frontal lobe is stable measuring 4.3 x 2.7 cm. The thin area of surrounding vasogenic edema is present. Subfalcine herniation measures 8 mm. No other foci of hemorrhage are present. No significant white matter disease is present. Ventricles are of normal size. The brainstem and cerebellum are within normal limits. Midline structures are within normal limits. Vascular: No hyperdense vessel or unexpected calcification. Skull: Calvarium is intact. No focal lytic or blastic lesions are present. No significant extracranial soft tissue lesion is present. Sinuses/Orbits: The paranasal sinuses and mastoid air cells are clear. The globes and orbits are within normal limits. Review of the MIP images confirms the above findings CTA NECK FINDINGS Aortic arch: A 3 vessel arch configuration is present. Minimal calcification is present in the distal aortic arch. No focal disease is present at the great vessel origins. No aneurysm or stenosis is present. Right carotid system: The right common carotid artery is within normal limits. The bifurcation is unremarkable. The cervical right ICA is normal. Left carotid system: The left common carotid artery is within normal limits. Focal calcification is present in the proximal left ICA without focal stenosis. The cervical left ICA is otherwise normal. Vertebral  arteries: The vertebral arteries are codominant. No significant stenosis is present in either vertebral artery in the neck. Both vessels originate from the subclavian arteries without significant stenosis. Skeleton: The vertebral body heights and alignment are normal. No focal osseous lesions are present. Other neck: Soft tissues the neck are otherwise unremarkable. Salivary glands are within normal limits. Thyroid is normal. No significant adenopathy is present. No focal mucosal or submucosal lesions are present. Upper chest: The lung apices are clear. The thoracic inlet is within normal limits. Review of the MIP images confirms the above findings CTA HEAD FINDINGS Anterior circulation: Atherosclerotic calcifications are present within the cavernous internal carotid arteries. The A1 and M1 segments are within normal limits. Right A1 and M1 are slightly displaced by the hemorrhage. No stenosis or occlusion is present. No aneurysm is present. The MCA bifurcations are within normal limits bilaterally. The ACA and MCA branch vessels are normal bilaterally. Posterior circulation: The vertebral arteries are codominant. PICA origins are visualized. The vertebrobasilar junction is normal. The basilar artery is normal. Superior cerebellar arteries are patent. Both posterior cerebral arteries originate from the basilar tip. The PCA branch vessels are normal bilaterally. No aneurysm is present. Venous sinuses: The dural sinuses are patent. The straight sinus and deep cerebral veins are intact. Cortical  veins are within normal limits. No significant vascular malformation is evident. Anatomic variants: None Review of the MIP images confirms the above findings IMPRESSION: 1. Stable 4.3 x 2.7 cm acute hemorrhage in the anterior inferior right frontal lobe with thin area of surrounding vasogenic edema. 2. Subfalcine herniation measures 8 mm. 3. Right A1 and M1 segments are slightly displaced by the hemorrhage. 4. No significant  vascular malformation or aneurysm. 5. Minimal atherosclerotic changes at the proximal left ICA and cavernous internal carotid arteries without significant stenosis. 6. Otherwise normal CTA Circle of Willis without significant proximal stenosis, aneurysm, or branch vessel occlusion. Electronically Signed   By: Marin Roberts M.D.   On: 06/11/2023 15:11   CT HEAD WO CONTRAST ( )  Result Date: 06/11/2023 CLINICAL DATA:  Headache, sudden, severe HA X 2 days, different from prior. EXAM: CT HEAD WITHOUT CONTRAST TECHNIQUE: Contiguous axial images were obtained from the base of the skull through the vertex without intravenous contrast. RADIATION DOSE REDUCTION: This exam was performed according to the departmental dose-optimization program which includes automated exposure control, adjustment of the mA and/or kV according to patient size and/or use of iterative reconstruction technique. COMPARISON:  None Available. FINDINGS: Brain: Acute intraparenchymal hematoma centered within the right basal ganglia and basal forebrain, measuring up to 4.5 x 2.7 x 3.0 cm (axial image 11 series 4, coronal image 45 series 6). Small amount of surrounding perilesional edema/infarct. No evidence of extra-axial component or intraventricular extension. Mild subfalcine herniation. Vascular: No hyperdense vessel or unexpected calcification. Skull: No calvarial fracture or suspicious bone lesion. Skull base is unremarkable. Sinuses/Orbits: No acute finding. Other: None. IMPRESSION: Acute intraparenchymal hematoma centered within the right basal ganglia and basal forebrain, measuring up to 4.5 cm. Small amount of surrounding perilesional edema/infarct. Mild subfalcine herniation. No evidence of extra-axial component or intraventricular extension. Critical Value/emergent results were called by telephone at the time of interpretation on 06/11/2023 at 12:40 pm to provider Toriann Spadoni , who verbally acknowledged these results. Electronically  Signed   By: Orvan Falconer M.D.   On: 06/11/2023 12:44    Procedures Procedures    Medications Ordered in ED Medications  sterile water (preservative free) injection (  See Procedure Record 06/11/23 1357)  metoCLOPramide (REGLAN) injection 5 mg (5 mg Intravenous Given 06/11/23 1123)  iohexol (OMNIPAQUE) 350 MG/ML injection 100 mL (75 mLs Intravenous Contrast Given 06/11/23 1223)  coag fact Xa recombinant (ANDEXXA) low dose infusion 900 mg (900 mg Intravenous New Bag/Given 06/11/23 1332)  Coag Fact Xa Inactivated-zhzo (ANDEXXA) 200 MG injection (  Given by Other 06/11/23 1359)    ED Course/ Medical Decision Making/ A&P                                 Medical Decision Making 77 year old male with past medical history of atrial fibrillation on Eliquis and migraine headaches presenting to the emergency department today with headache.  This is different in character to his previous headaches in the past.  I will further evaluate him here with basic labs as well as a COVID and flu swab to evaluate for viral etiology.  I will also obtain a CT scan of his head in addition to a CT angiogram to evaluate for mass lesion, intracranial hemorrhage, or vertebral artery dissection given the complaint of the neck discomfort and dizziness.  The patient does not have any meningismus here on exam.  He is afebrile with otherwise stable  vital signs.  I will give him a dose of Reglan here for his headache to see if this helps.  I will reevaluate for ultimate disposition.  He does not have any findings on exam to suggest CVA at this time.  He has no temporal artery tenderness and no other symptoms consistent with temporal arteritis.  The patient was found to have an intraparenchymal hemorrhage on CT.  I did call and discussed this with Dr. Wilford Corner.  The patient was given Andexxa to reverse the Eliquis.  He did recommend a CT angiogram which was ordered.  The patient's blood pressures remained within goal of 1 30-1 50  systolic.  The patient will be admitted to the neuro ICU at [facility] for further evaluation and management after Dr. Wilford Corner reviewed the CT angiogram.  Critical care time 45 minutes including multiple reassessments, coordination of care with pharmacy staff for treatment with Andexxa for intracranial hemorrhage  Amount and/or Complexity of Data Reviewed Labs: ordered. Radiology: ordered.  Risk Prescription drug management.           Final Clinical Impression(s) / ED Diagnoses Final diagnoses:  Intracranial hemorrhage (HCC)  Anticoagulant long-term use    Rx / DC Orders ED Discharge Orders     None         Durwin Glaze, MD 06/11/23 1527

## 2023-06-11 NOTE — ED Triage Notes (Signed)
Headache, neck pain started yesterday. More sleepy More off balance today. Woke up that way

## 2023-06-11 NOTE — Plan of Care (Signed)
Repeat CT head ordered-last CT head is around 6 hours ago now. CT ordered to ensure stability. Neurology will follow.  -- Milon Dikes, MD Neurologist [facility] Pager: [phone]

## 2023-06-11 NOTE — Plan of Care (Signed)
Received a secure chat from Dr. Doran Durand around 515 regarding disposition for this patient.  I had clearly communicated to the prior EDP Dr.Tee via secure chat to admit the patient to [facility] with my partner at [facility] as the admitting physician.  The order was never placed in until Dr. Doran Durand came in.  He has placed the order now.  On-call neurohospitalist Dr. Otelia Limes has been made aware.  Patient will be seen and admission order set as well as H&P will be done once he arrives at the ICU at [facility].  -- Milon Dikes, MD Neurologist [facility] Pager: [phone]

## 2023-06-11 NOTE — ED Provider Notes (Signed)
Care of patient received from prior provider at 5:16 PM, please see their note for complete H/P and care plan.   CRITICAL CARE Performed by: Glyn Ade  ?  Total critical care time: 45 minutes  Critical care time was exclusive of separately billable procedures and treating other patients.  Critical care was necessary to treat or prevent imminent or life-threatening deterioration.  Critical care was time spent personally by me on the following activities: development of treatment plan with patient and/or surrogate as well as nursing, discussions with consultants, evaluation of patient's response to treatment, examination of patient, obtaining history from patient or surrogate, ordering and performing treatments and interventions, ordering and review of laboratory studies, ordering and review of radiographic studies, pulse oximetry and re-evaluation of patient's condition.  Reassessment: Patient still in emergency department.  Reconsulted neurology given negative CTA.  They were in agreement with admission to neuro ICU under Dr. Amada Jupiter.  Admission order placed patient reassessed in no acute distress.     Glyn Ade, MD 06/11/23 [phone]

## 2023-06-11 NOTE — H&P (Signed)
Admission H&P    Chief Complaint: Headache and lethargy  HPI: Kyle [name] is a 77 y.o. male with a PMHx of atrial fibrillation (on Eliquis), migraines, hyperthyroidism, mild acid reflux and rosacea who presented to the [facility] ED on Wednesday morning with a 2 day history of headache and lethargy. Neurological exam by EDP was nonfocal. 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 There was mild surrounding edema versus ischemia. CTA of head and neck was obtained after reversal of Eliquis with Andexxa; there was no spot sign seen on CTA and there was no evidence for a vascular malformation. Initial blood pressures were within goal without medication. The patient was transferred to [facility] for admission to the Neuro ICU, further work up and ICH management. The patient continues to complain of right sided headache along the vertex posteriorly, with radiation to the neck. He states that his headache is different in character from his usual migraines, which he has about twice per month.   Past Medical History:  Diagnosis Date   Atrial fibrillation (HCC)    History of kidney stones 10-23-2011  SPONTANEOUSLY PASSED   History of migraine    Hyperthyroidism NO MEDS OR TX   MONITORED BY DR SOUTH   Mild acid reflux OCCASIONAL-  WATCHES DIET   PONV (postoperative nausea and vomiting)    Rosacea     Past Surgical History:  Procedure Laterality Date   CARDIOVERSION  10-15-2007  DR Riley Kill   NEW ON-SET A-FIB   ELBOW SURGERY     right   ELBOW SURGERY     KNEE ARTHROSCOPY WITH LATERAL MENISECTOMY  08/07/2012   Procedure: KNEE ARTHROSCOPY WITH LATERAL MENISECTOMY;  Surgeon: Drucilla Schmidt, MD;  Location: [facility];  Service: Orthopedics;  Laterality: Left;  Left Knee Arthroscopy with Partial Lateral Menisectomy    KNEE SURGERY     RIGHT ELBOW SURGERY  1992 (APPROX)   TENDINITIS   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 He has never used smokeless tobacco. He reports current alcohol use of about 3.0 - 4.0 standard drinks of alcohol per week. He reports that he does not use drugs.  Allergies:  Allergies  Allergen Reactions   Ampicillin Rash    Medications Prior to Admission  Medication Sig Dispense Refill   apixaban (ELIQUIS) 5 MG TABS tablet TAKE 1 TABLET TWICE A DAY 180 tablet 1   doxycycline (PERIOSTAT) 20 MG tablet Take 20 mg by mouth 2 (two) times daily.     methimazole (TAPAZOLE) 10 MG tablet Take 10 mg by mouth daily.     rosuvastatin (CRESTOR) 20 MG tablet TAKE 1 TABLET DAILY. SCHEDULE OFFICE VISIT FOR FUTURE REFILLS 90 tablet 2   furosemide (LASIX) 20 MG tablet TAKE 1 TABLET (20 MG TOTAL) BY MOUTH AS NEEDED (LOWER LEG SWELLING). (Patient not taking: Reported on 06/11/2023) 90 tablet 3    ROS: He does not endorse any limb weakness, sensory loss or ataxia. He was experiencing an unsteady gait prior to presenting to the ED. Does not complain of any confusion or aphasia. Other ROS as per HPI.   Physical Examination: Blood pressure (!) 133/98, pulse 72, temperature 98.6 F (  37 C), temperature source Oral, resp. rate (!) 23, SpO2 98%.  Physical Exam  HEENT-  [place]/AT    Lungs- Respirations unlabored Extremities- No edema  Neurological Examination Mental Status: Alert, oriented x 5, thought content appropriate.  Speech fluent with intact naming, comprehension and repetition.  No dysarthria. Able to follow all commands without difficulty. Cranial Nerves: II: Temporal visual fields intact with no extinction to DSS. PERRL  III,IV, VI: No ptosis. EOMI.  V: Temp sensation decreased on the left  VII: Smile symmetric VIII: Hearing intact to voice IX,X: No hoarseness XI: Symmetric shoulder shrug XII:  Midline tongue extension Motor: RUE 5/5 proximally and distally LUE with 4+/5 deltoid, trapezius and triceps strength, otherwise 5/5 BLE 5/5 proximally and distally, except for subtle hip flexion weakness to LLE rated as 4+/5 No pronator drift.  Sensory: Light touch intact throughout, bilaterally. No extinction to DSS. Temp sensation decreased to RLE Deep Tendon Reflexes: 2+ and symmetric biceps and brachioradialis. 1+ bilateral patellae.  Cerebellar: No ataxia with FNF bilaterally  Gait: Deferred  Results for orders placed or performed during the hospital encounter of 06/11/23 (from the past 48 hour(s))  Basic metabolic panel     Status: Abnormal   Collection Time: 06/11/23 11:09 AM  Result Value Ref Range   Sodium 140 135 - 145 mmol/L   Potassium 4.4 3.5 - 5.1 mmol/L   Chloride 106 98 - 111 mmol/L   CO2 27 22 - 32 mmol/L   Glucose, Bld 92 70 - 99 mg/dL    Comment: Glucose reference range applies only to samples taken after fasting for at least 8 hours.   BUN 15 8 - 23 mg/dL   Creatinine, Ser 1.61 0.61 - 1.24 mg/dL   Calcium 8.6 (L) 8.9 - 10.3 mg/dL   GFR, Estimated >09 >60 mL/min    Comment: (NOTE) Calculated using the CKD-EPI Creatinine Equation (2021)    Anion gap 7 5 - 15    Comment: Performed at [facility], [address] [place], [place] [address]  CBC     Status: None   Collection Time: 06/11/23 11:09 AM  Result Value Ref Range   WBC 9.0 4.0 - 10.5 K/uL   RBC 4.76 4.22 - 5.81 MIL/uL   Hemoglobin 15.1 13.0 - 17.0 g/dL   HCT 81.1 91.4 - 78.2 %   MCV 91.6 80.0 - 100.0 fL   MCH 31.7 26.0 - 34.0 pg   MCHC 34.6 30.0 - 36.0 g/dL   RDW 95.6 21.3 - 08.6 %   Platelets 177 150 - 400 K/uL   nRBC 0.0 0.0 - 0.2 %    Comment: Performed at [facility], [address], [place], [place] [address]  Resp panel by RT-PCR (RSV, Flu A&B, Covid) Anterior Nasal Swab     Status: None   Collection Time: 06/11/23 11:10 AM   Specimen: Anterior  Nasal Swab  Result Value Ref Range   SARS Coronavirus 2 by RT PCR NEGATIVE NEGATIVE    Comment: (NOTE) SARS-CoV-2 target nucleic acids are NOT DETECTED.  The SARS-CoV-2 RNA is generally detectable in upper respiratory specimens during the acute phase of infection. The lowest concentration of SARS-CoV-2 viral copies this assay can detect is 138 copies/mL. A negative result does not preclude SARS-Cov-2 infection and should not be used as the sole basis for treatment or other patient management decisions. A negative result may occur with  improper specimen collection/handling, submission of specimen other than nasopharyngeal swab, presence of viral mutation(s)  within the areas targeted by this assay, and inadequate number of viral copies(<138 copies/mL). A negative result must be combined with clinical observations, patient history, and epidemiological information. The expected result is Negative.  Fact Sheet for Patients:  BloggerCourse.com  Fact Sheet for Healthcare Providers:  SeriousBroker.it  This test is no t yet approved or cleared by the [place] FDA and  has been authorized for detection and/or diagnosis of SARS-CoV-2 by FDA under an Emergency Use Authorization (EUA). This EUA will remain  in effect (meaning this test can be used) for the duration of the COVID-19 declaration under Section 564(b)(1) of the Act, 21 U.S.C.section 360bbb-3(b)(1), unless the authorization is terminated  or revoked sooner.       Influenza A by PCR NEGATIVE NEGATIVE   Influenza B by PCR NEGATIVE NEGATIVE    Comment: (NOTE) The Xpert Xpress SARS-CoV-2/FLU/RSV plus assay is intended as an aid in the diagnosis of influenza from Nasopharyngeal swab specimens and should not be used as a sole basis for treatment. Nasal washings and aspirates are unacceptable for Xpert Xpress SARS-CoV-2/FLU/RSV testing.  Fact Sheet for  Patients: BloggerCourse.com  Fact Sheet for Healthcare Providers: SeriousBroker.it  This test is not yet approved or cleared by the [place] FDA and has been authorized for detection and/or diagnosis of SARS-CoV-2 by FDA under an Emergency Use Authorization (EUA). This EUA will remain in effect (meaning this test can be used) for the duration of the COVID-19 declaration under Section 564(b)(1) of the Act, 21 U.S.C. section 360bbb-3(b)(1), unless the authorization is terminated or revoked.     Resp Syncytial Virus by PCR NEGATIVE NEGATIVE    Comment: (NOTE) Fact Sheet for Patients: BloggerCourse.com  Fact Sheet for Healthcare Providers: SeriousBroker.it  This test is not yet approved or cleared by the [place] FDA and has been authorized for detection and/or diagnosis of SARS-CoV-2 by FDA under an Emergency Use Authorization (EUA). This EUA will remain in effect (meaning this test can be used) for the duration of the COVID-19 declaration under Section 564(b)(1) of the Act, 21 U.S.C. section 360bbb-3(b)(1), unless the authorization is terminated or revoked.  Performed at [facility], [address] [place], [place] [address]    CT [facility] HEAD NECK W WO CM  Result Date: 06/11/2023 CLINICAL DATA:  Headache and neck pain beginning yesterday. Intracranial hemorrhage. EXAM: CT ANGIOGRAPHY HEAD AND NECK WITH AND WITHOUT CONTRAST TECHNIQUE: Multidetector CT imaging of the head and neck was performed using the standard protocol during bolus administration of intravenous contrast. Multiplanar CT image reconstructions and MIPs were obtained to evaluate the vascular anatomy. Carotid stenosis measurements (when applicable) are obtained utilizing NASCET criteria, using the distal internal carotid diameter as the denominator. RADIATION DOSE REDUCTION: This exam was  performed according to the departmental dose-optimization program which includes automated exposure control, adjustment of the mA and/or kV according to patient size and/or use of iterative reconstruction technique. CONTRAST:  75mL OMNIPAQUE IOHEXOL 350 MG/ML SOLN COMPARISON:  CT head without contrast 06/11/2023 at 12:26 p.m. FINDINGS: CT HEAD FINDINGS Brain: Acute hemorrhage in the anterior inferior right frontal lobe is stable measuring 4.3 x 2.7 cm. The thin area of surrounding vasogenic edema is present. Subfalcine herniation measures 8 mm. No other foci of hemorrhage are present. No significant white matter disease is present. Ventricles are of normal size. The brainstem and cerebellum are within normal limits. Midline structures are within normal limits. Vascular: No hyperdense vessel or unexpected calcification. Skull: Calvarium is intact. No focal  lytic or blastic lesions are present. No significant extracranial soft tissue lesion is present. Sinuses/Orbits: The paranasal sinuses and mastoid air cells are clear. The globes and orbits are within normal limits. Review of the MIP images confirms the above findings CTA NECK FINDINGS Aortic arch: A 3 vessel arch configuration is present. Minimal calcification is present in the distal aortic arch. No focal disease is present at the great vessel origins. No aneurysm or stenosis is present. Right carotid system: The right common carotid artery is within normal limits. The bifurcation is unremarkable. The cervical right ICA is normal. Left carotid system: The left common carotid artery is within normal limits. Focal calcification is present in the proximal left ICA without focal stenosis. The cervical left ICA is otherwise normal. Vertebral arteries: The vertebral arteries are codominant. No significant stenosis is present in either vertebral artery in the neck. Both vessels originate from the subclavian arteries without significant stenosis. Skeleton: The vertebral  body heights and alignment are normal. No focal osseous lesions are present. Other neck: Soft tissues the neck are otherwise unremarkable. Salivary glands are within normal limits. Thyroid is normal. No significant adenopathy is present. No focal mucosal or submucosal lesions are present. Upper chest: The lung apices are clear. The thoracic inlet is within normal limits. Review of the MIP images confirms the above findings CTA HEAD FINDINGS Anterior circulation: Atherosclerotic calcifications are present within the cavernous internal carotid arteries. The A1 and M1 segments are within normal limits. Right A1 and M1 are slightly displaced by the hemorrhage. No stenosis or occlusion is present. No aneurysm is present. The MCA bifurcations are within normal limits bilaterally. The ACA and MCA branch vessels are normal bilaterally. Posterior circulation: The vertebral arteries are codominant. PICA origins are visualized. The vertebrobasilar junction is normal. The basilar artery is normal. Superior cerebellar arteries are patent. Both posterior cerebral arteries originate from the basilar tip. The PCA branch vessels are normal bilaterally. No aneurysm is present. Venous sinuses: The dural sinuses are patent. The straight sinus and deep cerebral veins are intact. Cortical veins are within normal limits. No significant vascular malformation is evident. Anatomic variants: None Review of the MIP images confirms the above findings IMPRESSION: 1. Stable 4.3 x 2.7 cm acute hemorrhage in the anterior inferior right frontal lobe with thin area of surrounding vasogenic edema. 2. Subfalcine herniation measures 8 mm. 3. Right A1 and M1 segments are slightly displaced by the hemorrhage. 4. No significant vascular malformation or aneurysm. 5. Minimal atherosclerotic changes at the proximal left ICA and cavernous internal carotid arteries without significant stenosis. 6. Otherwise normal CTA Circle of Willis without significant  proximal stenosis, aneurysm, or branch vessel occlusion. Electronically Signed   By: Marin Roberts M.D.   On: 06/11/2023 15:11   CT HEAD WO CONTRAST ( )  Result Date: 06/11/2023 CLINICAL DATA:  Headache, sudden, severe HA X 2 days, different from prior. EXAM: CT HEAD WITHOUT CONTRAST TECHNIQUE: Contiguous axial images were obtained from the base of the skull through the vertex without intravenous contrast. RADIATION DOSE REDUCTION: This exam was performed according to the departmental dose-optimization program which includes automated exposure control, adjustment of the mA and/or kV according to patient size and/or use of iterative reconstruction technique. COMPARISON:  None Available. FINDINGS: Brain: Acute intraparenchymal hematoma centered within the right basal ganglia and basal forebrain, measuring up to 4.5 x 2.7 x 3.0 cm (axial image 11 series 4, coronal image 45 series 6). Small amount of surrounding perilesional edema/infarct. No evidence of  extra-axial component or intraventricular extension. Mild subfalcine herniation. Vascular: No hyperdense vessel or unexpected calcification. Skull: No calvarial fracture or suspicious bone lesion. Skull base is unremarkable. Sinuses/Orbits: No acute finding. Other: None. IMPRESSION: Acute intraparenchymal hematoma centered within the right basal ganglia and basal forebrain, measuring up to 4.5 cm. Small amount of surrounding perilesional edema/infarct. Mild subfalcine herniation. No evidence of extra-axial component or intraventricular extension. Critical Value/emergent results were called by telephone at the time of interpretation on 06/11/2023 at 12:40 pm to provider ANDREW TEE , who verbally acknowledged these results. Electronically Signed   By: Orvan Falconer M.D.   On: 06/11/2023 12:44     Assessment: 78 y.o. male with a PMHx of atrial fibrillation (on Eliquis), migraines, hyperthyroidism, mild acid reflux and rosacea who presented to the [facility] ED  on Wednesday morning with a 2 day history of headache and lethargy. Neurological exam by EDP was nonfocal. 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 There was mild surrounding edema versus ischemia. CTA of head and neck was obtained after reversal of Eliquis with Andexxa; there was no spot sign seen on CTA and there was no evidence for a vascular malformation. Initial blood pressures were within goal without medication. The patient was transferred to [facility] for admission to the Neuro ICU, further work up and ICH management. The patient continues to complain of right sided headache along the vertex posteriorly, with radiation to the neck. He states that his headache is different in character from his usual migraines, which he has about twice per month.  - Exam reveals mild patchy sensory deficit and subtle weakness to LUE and LLE. No aphasia noted. He is awake, alert and fully oriented, but does appear fatigued.  - CT head (12:31 PM): Acute intraparenchymal hematoma centered within the right basal ganglia and basal forebrain, measuring up to 4.5 cm. Small amount of surrounding perilesional edema/infarct. Mild subfalcine herniation. No evidence of extra-axial component or intraventricular extension. - CTA of head and neck (1:56 PM): Stable 4.3 x 2.7 cm acute hemorrhage in the anterior inferior right frontal lobe with thin area of surrounding vasogenic edema. Subfalcine herniation measures 8 mm. Right A1 and M1 segments are slightly displaced by the hemorrhage. No significant vascular malformation or aneurysm. Minimal atherosclerotic changes at the proximal left ICA and cavernous internal carotid arteries without significant stenosis. Otherwise normal CTA Circle of Willis without significant proximal stenosis, aneurysm, or branch vessel occlusion. - EKG: Atrial fibrillation   Plan: 1. Admit to ICU under the Neurology service 2. MRI of head 3. No  antiplatelet medications or anticoagulants. Eliquis has been discontinued and patient may need to be off anticoagulation indefinitely. In approximately 14 days, after hemorrhage is confirmed to be stabilized on repeat CT, initiation of ASA can be considered.  4. TTE 5. PT consult, OT consult, Speech consult 6. Cardiac telemetry 7. Frequent neuro checks 8. Hold off on hypertonic saline for now. May need to start if repeat CT shows increased perihematomal edema  9. Repeat CT head at 2:00 AM (ordered) 10. BP management with clevidipine drip. SBP goal of 130-150. 11. The patient states that he wishes to be Full Code  50 minutes spent in the emergent neurological evaluation and management of this critically ill patient.   Electronically signed: Dr. Caryl Pina 06/11/2023, 8:03 PM

## 2023-06-11 NOTE — ED Notes (Signed)
Patient transported to CT 

## 2023-06-11 NOTE — ED Notes (Signed)
Pt's wife would like to be contacted with any updates regarding pt's admission. Dara Lords [phone])

## 2023-06-11 NOTE — Plan of Care (Signed)
Neurology on-call note  I received a call from Dr. Rhae Hammock, at [facility] regarding this patient with last known well 2 days ago coming in with complaints of headache, drowsiness and being off balance.  Slow to respond to questions on exam but otherwise nonfocal.  CT head with an intraparenchymal hemorrhage in the right basal ganglia and basal for brain with small amount of perilesional edema/infarction and mild subfalcine herniation with no evidence of extra-axial component or intraventricular extension. He is on Eliquis for A-fib.  He took his last dose of Eliquis this morning.  Recommendations Reverse Eliquis Stat CTA head and neck after reversal If there is any underlying vascular malformation, will need neurosurgical evaluation and admission. If there is no underlying vascular malformation, I would recommend admission to the neurology service, and the neurological ICU. Currently blood pressure is within goal and not requiring any drips.  Milon Dikes, MD Neurologist on call for ED [facility]

## 2023-06-11 NOTE — ED Notes (Signed)
Kyle [name] with cl called for transport

## 2023-06-11 NOTE — ED Notes (Signed)
Patient transported to CT, monitored with nursing

## 2023-06-11 NOTE — ED Notes (Signed)
Updated family.

## 2023-06-12 ENCOUNTER — Inpatient Hospital Stay (HOSPITAL_COMMUNITY): Payer: Medicare Other

## 2023-06-12 DIAGNOSIS — I610 Nontraumatic intracerebral hemorrhage in hemisphere, subcortical: Secondary | ICD-10-CM

## 2023-06-12 DIAGNOSIS — I482 Chronic atrial fibrillation, unspecified: Secondary | ICD-10-CM | POA: Diagnosis not present

## 2023-06-12 DIAGNOSIS — I6389 Other cerebral infarction: Secondary | ICD-10-CM | POA: Diagnosis not present

## 2023-06-12 DIAGNOSIS — Z7901 Long term (current) use of anticoagulants: Secondary | ICD-10-CM

## 2023-06-12 LAB — BASIC METABOLIC PANEL
Anion gap: 10 (ref 5–15)
BUN: 14 mg/dL (ref 8–23)
CO2: 23 mmol/L (ref 22–32)
Calcium: 8.5 mg/dL — ABNORMAL LOW (ref 8.9–10.3)
Chloride: 102 mmol/L (ref 98–111)
Creatinine, Ser: 1.03 mg/dL (ref 0.61–1.24)
GFR, Estimated: >60 mL/min (ref >60)
Glucose, Bld: 154 mg/dL — ABNORMAL HIGH (ref 70–99)
Potassium: 4 mmol/L (ref 3.5–5.1)
Sodium: 135 mmol/L (ref 135–145)

## 2023-06-12 LAB — HEMOGLOBIN A1C
Hgb A1c MFr Bld: 6 % — ABNORMAL HIGH (ref 4.8–5.6)
Mean Plasma Glucose: 125.5 mg/dL

## 2023-06-12 LAB — LIPID PANEL
Cholesterol: 101 mg/dL (ref 0–200)
HDL: 32 mg/dL — ABNORMAL LOW (ref >40)
LDL Cholesterol: 61 mg/dL (ref 0–99)
Total CHOL/HDL Ratio: 3.2 RATIO
Triglycerides: 42 mg/dL (ref <150)
VLDL: 8 mg/dL (ref 0–40)

## 2023-06-12 LAB — ECHOCARDIOGRAM COMPLETE
S' Lateral: 3.3 cm
Weight: 3262.81 oz

## 2023-06-12 LAB — CBC
HCT: 43.7 % (ref 39.0–52.0)
Hemoglobin: 15 g/dL (ref 13.0–17.0)
MCH: 31.6 pg (ref 26.0–34.0)
MCHC: 34.3 g/dL (ref 30.0–36.0)
MCV: 92.2 fL (ref 80.0–100.0)
Platelets: 165 10*3/uL (ref 150–400)
RBC: 4.74 MIL/uL (ref 4.22–5.81)
RDW: 15.2 % (ref 11.5–15.5)
WBC: 8.2 10*3/uL (ref 4.0–10.5)
nRBC: 0 % (ref 0.0–0.2)

## 2023-06-12 MED ORDER — METHIMAZOLE 10 MG PO TABS
10.0000 mg | ORAL_TABLET | Freq: Every day | ORAL | Status: DC
  Administered 2023-06-12: 10 mg via ORAL
  Filled 2023-06-12 (×2): qty 1

## 2023-06-12 MED ORDER — IOHEXOL 350 MG/ML SOLN
75.0000 mL | Freq: Once | INTRAVENOUS | Status: AC | PRN
  Administered 2023-06-12: 75 mL via INTRAVENOUS

## 2023-06-12 MED ORDER — GADOBUTROL 1 MMOL/ML IV SOLN
9.0000 mL | Freq: Once | INTRAVENOUS | Status: AC | PRN
  Administered 2023-06-12: 9 mL via INTRAVENOUS

## 2023-06-12 MED ORDER — PANTOPRAZOLE SODIUM 40 MG PO TBEC
40.0000 mg | DELAYED_RELEASE_TABLET | Freq: Every day | ORAL | Status: DC
  Administered 2023-06-12: 40 mg via ORAL
  Filled 2023-06-12: qty 1

## 2023-06-12 MED ORDER — ROSUVASTATIN CALCIUM 20 MG PO TABS
20.0000 mg | ORAL_TABLET | Freq: Every day | ORAL | Status: DC
  Administered 2023-06-13: 20 mg via ORAL
  Filled 2023-06-12: qty 1

## 2023-06-12 MED ORDER — SODIUM CHLORIDE 0.9 % IV SOLN: INTRAVENOUS | Status: DC

## 2023-06-12 NOTE — Progress Notes (Addendum)
STROKE TEAM PROGRESS NOTE   BRIEF HPI [name] is a 77 y.o. male with history of A-fib on Eliquis, migraines, hypothyroidism, GERD and rosacea presenting with a 2-day history of headache and lethargy.  CT head in the ED revealed an early subacute right basal ganglia hemorrhage with no IVH.  Eliquis was reversed with Andexxa, and no spot sign was seen on CTA.  Patient was admitted to the ICU, and blood pressure has remained stable without intervention.   SIGNIFICANT HOSPITAL EVENTS 9/18 patient admitted with right basal ganglia ICH  INTERIM HISTORY/SUBJECTIVE Patient has been hemodynamically stable with Tmax of 100.8.  He has no complaints of headache at this time, and neurological exam is stable.   OBJECTIVE  CBC    Component Value Date/Time   WBC 8.2 06/12/2023 1038   RBC 4.74 06/12/2023 1038   HGB 15.0 06/12/2023 1038   HGB 15.8 11/20/2017 0952   HCT 43.7 06/12/2023 1038   HCT 45.9 11/20/2017 0952   PLT 165 06/12/2023 1038   PLT 210 11/20/2017 0952   MCV 92.2 06/12/2023 1038   MCV 93 11/20/2017 0952   MCH 31.6 06/12/2023 1038   MCHC 34.3 06/12/2023 1038   RDW 15.2 06/12/2023 1038   RDW 13.8 11/20/2017 0952   LYMPHSABS 1.8 09/07/2010 0945   MONOABS 0.6 09/07/2010 0945   EOSABS 0.3 09/07/2010 0945   BASOSABS 0.0 09/07/2010 0945    BMET    Component Value Date/Time   NA 135 06/12/2023 1038   NA 142 02/14/2022 0913   K 4.0 06/12/2023 1038   CL 102 06/12/2023 1038   CO2 23 06/12/2023 1038   GLUCOSE 154 (H) 06/12/2023 1038   BUN 14 06/12/2023 1038   BUN 18 02/14/2022 0913   CREATININE 1.03 06/12/2023 1038   CREATININE 1.02 12/02/2016 0951   CALCIUM 8.5 (L) 06/12/2023 1038   EGFR 84 02/14/2022 0913   GFRNONAA >60 06/12/2023 1038   GFRNONAA 72 04/24/2015 0845    IMAGING past 24 hours ECHOCARDIOGRAM COMPLETE  Result Date: 06/12/2023    ECHOCARDIOGRAM REPORT   Patient Name:   Kyle [name] Date of Exam: 06/12/2023 Medical Rec #:  [id number]    Height:        73.0 in Accession #:    [id number]   Weight:       203.9 lb Date of Birth:  12/06/1945     BSA:          2.169 m Patient Age:    77 years     BP:           137/92 mmHg Patient Gender: M            HR:           72 bpm. Exam Location:  Inpatient Procedure: 2D Echo, Cardiac Doppler and Color Doppler Indications:    Stroke I63.9  History:        Patient has prior history of Echocardiogram examinations, most                 recent 04/05/2022. Arrythmias:Atrial Fibrillation.  Sonographer:    Harriette Bouillon RDCS Referring Phys: ERIC LINDZEN IMPRESSIONS  1. Left ventricular ejection fraction, by estimation, is 55 to 60%. The left ventricle has normal function. The left ventricle has no regional wall motion abnormalities. Left ventricular diastolic parameters are indeterminate.  2. Right ventricular systolic function is normal. The right ventricular size is mildly enlarged. Tricuspid regurgitation signal is inadequate for assessing PA  pressure.  3. Left atrial size was severely dilated.  4. Right atrial size was moderately dilated.  5. The mitral valve was not well visualized. Trivial mitral valve regurgitation. No evidence of mitral stenosis.  6. The aortic valve is tricuspid. There is mild calcification of the aortic valve. Aortic valve regurgitation is not visualized. No aortic stenosis is present.  7. The inferior vena cava is dilated in size with <50% respiratory variability, suggesting right atrial pressure of 15 mmHg. Comparison(s): Prior images reviewed side by side. Tricuspid regurgitation has improved. FINDINGS  Left Ventricle: Left ventricular ejection fraction, by estimation, is 55 to 60%. The left ventricle has normal function. The left ventricle has no regional wall motion abnormalities. The left ventricular internal cavity size was normal in size. There is  no left ventricular hypertrophy. Left ventricular diastolic parameters are indeterminate. Right Ventricle: The right ventricular size is mildly enlarged. No  increase in right ventricular wall thickness. Right ventricular systolic function is normal. Tricuspid regurgitation signal is inadequate for assessing PA pressure. Left Atrium: Left atrial size was severely dilated. Right Atrium: Right atrial size was moderately dilated. Pericardium: There is no evidence of pericardial effusion. Mitral Valve: The mitral valve was not well visualized. Trivial mitral valve regurgitation. No evidence of mitral valve stenosis. Tricuspid Valve: The tricuspid valve is normal in structure. Tricuspid valve regurgitation is not demonstrated. No evidence of tricuspid stenosis. Aortic Valve: The aortic valve is tricuspid. There is mild calcification of the aortic valve. Aortic valve regurgitation is not visualized. No aortic stenosis is present. Pulmonic Valve: The pulmonic valve was normal in structure. Pulmonic valve regurgitation is trivial. No evidence of pulmonic stenosis. Aorta: The aortic root and ascending aorta are structurally normal, with no evidence of dilitation. Venous: The inferior vena cava is dilated in size with less than 50% respiratory variability, suggesting right atrial pressure of 15 mmHg. IAS/Shunts: No atrial level shunt detected by color flow Doppler.  LEFT VENTRICLE PLAX 2D LVIDd:         4.90 cm LVIDs:         3.30 cm LV PW:         0.90 cm LV IVS:        0.90 cm LVOT diam:     2.00 cm LV SV:         57 LV SV Index:   26 LVOT Area:     3.14 cm  RIGHT VENTRICLE             IVC RV S prime:     11.60 cm/s  IVC diam: 2.50 cm TAPSE (M-mode): 1.7 cm LEFT ATRIUM              Index        RIGHT ATRIUM           Index LA diam:        4.70 cm  2.17 cm/m   RA Area:     28.40 cm LA Vol (A2C):   110.0 ml 50.70 ml/m  RA Volume:   87.20 ml  40.19 ml/m LA Vol (A4C):   108.0 ml 49.78 ml/m LA Biplane Vol: 111.0 ml 51.16 ml/m  AORTIC VALVE LVOT Vmax:   90.20 cm/s LVOT Vmean:  59.700 cm/s LVOT VTI:    0.180 m  AORTA Ao Root diam: 3.40 cm Ao Asc diam:  3.20 cm  SHUNTS Systemic  VTI:  0.18 m Systemic Diam: 2.00 cm Riley Lam MD Electronically signed by Riley Lam MD Signature  Date/Time: 06/12/2023/12:31:23 PM    Final    MR BRAIN W WO CONTRAST  Result Date: 06/12/2023 CLINICAL DATA:  Hemorrhagic stroke EXAM: MRI HEAD WITHOUT AND WITH CONTRAST TECHNIQUE: Multiplanar, multiecho pulse sequences of the brain and surrounding structures were obtained without and with intravenous contrast. CONTRAST:  9mL GADAVIST GADOBUTROL 1 MMOL/ML IV SOLN COMPARISON:  06/11/2023 CT head, no prior MRI available FINDINGS: Evaluation is limited by motion artifact brain: Redemonstrated acute intraparenchymal hematoma centered in the right basal ganglia, measuring 4.3 x 3.0 x 2.9 cm (AP x TR x CC) (series 12, image 13 and series 16, image 20), previously 4.5 x 2.7 x 3.0 cm, overall unchanged when accounting for differences scan plane. Surrounding restricted diffusion may be related to susceptibility or associated infarcted parenchyma. Surrounding T2 hyperintense signal, consistent with edema. 7 mm of right-to-left midline shift no evidence of underlying mass. No evidence of active extravasation. Layering hemorrhage in occipital horns, third ventricle, and fourth ventricle. The hemorrhage and associated edema causes mass effect on the right greater than left body and frontal horn of the lateral ventricles. No evidence of entrapment or hydrocephalus. Foci of hemosiderin deposition in the bilateral cerebral and cerebellar hemispheres, with likely sequela of remote subarachnoid hemorrhage. Possible punctate foci of restricted diffusion in the posterior right frontal lobe (series 5, images 97 and 98). Vascular: Normal arterial flow voids. Normal arterial and venous enhancement. Skull and upper cervical spine: Normal marrow signal. Sinuses/Orbits: Mucosal thickening in the right maxillary sinus and ethmoid air cells. No acute finding in the orbits. Other: Fluid in the right-greater-than-left mastoid  air cells. IMPRESSION: 1. Evaluation is limited by motion artifact. Within this limitation, redemonstrated acute intraparenchymal hematoma centered in the right basal ganglia, overall unchanged compared to the prior CT, with surrounding edema and 7 mm of right-to-left midline shift. No evidence of underlying mass or active extravasation. 2. Layering hemorrhage in the occipital horns, third ventricle, and fourth ventricle. Mass effect on the right greater than left body and frontal horn of the lateral ventricles without evidence of entrapment or hydrocephalus. 3. Possible punctate foci of restricted diffusion in the posterior right frontal lobe, which may represent additional foci of acute or subacute infarct. 4. Foci of hemosiderin deposition in the bilateral cerebral and cerebellar hemispheres, with likely sequela of remote subarachnoid hemorrhage. This could represent prior hypertensive microhemorrhages but could also be seen in the setting of cerebral amyloid angiopathy. Electronically Signed   By: Wiliam Ke M.D.   On: 06/12/2023 02:07    Vitals:   06/12/23 1200 06/12/23 1300 06/12/23 1330 06/12/23 1400  BP: (!) 149/101 132/73 104/73 135/89  Pulse: 61 64 64 64  Resp: (!) 22 20 19 19   Temp: 99.8 F (37.7 C)     TempSrc: Oral     SpO2: 96% 96% 98% 94%  Weight:         PHYSICAL EXAM General:  Alert, well-nourished, well-developed patient in no acute distress Psych:  Mood and affect appropriate for situation CV: Regular rate and rhythm on monitor Respiratory:  Regular, unlabored respirations on room air   NEURO:  Mental Status: AA&Ox3, patient is able to give clear and coherent history Speech/Language: speech is without dysarthria or aphasia.    Cranial Nerves:  II: PERRL.  III, IV, VI: EOMI. Eyelids elevate symmetrically.  V: Sensation is intact to light touch and symmetrical to face.  VII: Face is symmetrical resting and smiling VIII: hearing intact to voice. IX, X: Phonation is  normal.  VH:QIONGEXB shrug 5/5. XII: tongue is midline without fasciculations. Motor: 5/5 strength to all muscle groups tested.  Tone: is normal and bulk is normal Sensation- Intact to light touch bilaterally. Coordination: FTN intact bilaterally.No drift.  Gait- deferred   ASSESSMENT/PLAN  Intracerebral Hemorrhage:  right basal ganglia ICH, possibly hypertensive in setting of Eliquis use CT head acute IPH centered within right basal ganglia and basal for brain CTA head & neck stable acute hemorrhage in the anterior inferior right frontal lobe with surrounding vasogenic edema, and midline shift of 8 mm, right A1 and M1 segment slightly displaced by the hemorrhage, no LVO or other significant stenosis MRI acute IPH in right basal ganglia with 7 mm right to left midline shift, layering hemorrhage in the occipital horns, third ventricle and fourth ventricle, no evidence of entrapment or hydrocephalus, possible punctate foci of restricted diffusion in posterior right frontal lobe 2D Echo EF 55 to 60%, left atrial size severely dilated, right atrium moderately dilated, no atrial level shunt LDL 35 HgbA1c 6.0 VTE prophylaxis -SCDs Eliquis (apixaban) daily prior to admission, now on No antithrombotic secondary to IPH Therapy recommendations:  Outpatient PT/OT/ST Disposition: Pending  Atrial fibrillation Home Meds: Eliquis S/p Andexxa  Continue telemetry monitoring Hold anticoagulation for now given Eliquis, will consider resumption of anticoagulation once hematoma resolved versus Watchman procedure vs. ASPIRE trial. Follow up with Dr. Jens Som   Hypertension Home meds: None Stable Blood Pressure Goal: SBP between 130-150 for 24 hours and then less than 160   Hyperlipidemia Home meds: Rosuvastatin 20 mg daily LDL 35, goal < 70 Resume home crestor 20 Continue statin at discharge  Other Stroke Risk Factors Migraines   Other Active Problems Hyperthyroidism - on home methimazole   Transient Temp 100.8 without clear etiology  Hospital day # 1  Patient seen by NP and then by MD, MD to edit note as needed. Cortney E Ernestina Columbia , MSN, AGACNP-BC [facility] See Amion for schedule and pager information 06/12/2023 2:21 PM   ATTENDING NOTE: I reviewed above note and agree with the assessment and plan. Pt was seen and examined.   Wife at the bedside. Pt still has mild right side head as well as neck pain, likely related to ICH and IVH. But otherwise neuro stable no focal deficit. BP under control, did not need IV BP meds. Current off AC for afib due to ICH, but discussed extensively regarding future plan of afib management, with options of resumption of AC once hematoma resolution vs. Watchman device vs. ASPIRE clinical trial. Continue home statin and methimazole. PT and OT recommend outpt.   For detailed assessment and plan, please refer to above/below as I have made changes wherever appropriate.   Marvel Plan, MD PhD Stroke Neurology 06/12/2023 9:48 PM  This patient is critically ill due to ICH in the setting of afib on eliquis, s/p reversal and at significant risk of neurological worsening, death form recurrent bleeding, stroke, heart failure This patient's care requires constant monitoring of vital signs, hemodynamics, respiratory and cardiac monitoring, review of multiple databases, neurological assessment, discussion with family, other specialists and medical decision making of high complexity. I spent 45 minutes of neurocritical care time in the care of this patient. I had long discussion with wife and pt at bedside, updated pt current condition, treatment plan and potential prognosis, and answered all the questions. They expressed understanding and appreciation.      To contact Stroke Continuity provider, please refer to [email or web address]. After hours, contact General  Neurology

## 2023-06-12 NOTE — Evaluation (Addendum)
Occupational Therapy Evaluation Patient Details Name: Kyle [name] MRN: [id number] DOB: 1945-12-03 Today's Date: 06/12/2023   History of Present Illness Pt is a 77 y/o male presenting on 9/18 with headache and lethargy. 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 PMH includes: afib, migraines, R elbow surgery, L knee surgery.   Clinical Impression   PTA patient independent and driving. Admitted for above and presents with problem list below.  He is oriented and follows commands, but demonstrates some decreased attention and slowed processing.  Pt with improving awareness to deficits and safety throughout session. Would benefit from pill box test to assess higher level cognition. He is able to complete transfers with min guard, but requires up to min assist at times for mobility due to loss of balance; up to min assist for ADLs. VSS during session.  Anticipate he will benefit from continued OT services acutely and outpatient neuro OT services at dc to optimize independence, safety and return to PLOF.         If plan is discharge home, recommend the following: A little help with walking and/or transfers;A little help with bathing/dressing/bathroom;Assistance with cooking/housework;Assist for transportation;Direct supervision/assist for financial management;Direct supervision/assist for medications management;Help with stairs or ramp for entrance    Functional Status Assessment  Patient has had a recent decline in their functional status and demonstrates the ability to make significant improvements in function in a reasonable and predictable amount of time.  Equipment Recommendations  Tub/shower seat    Recommendations for Other Services       Precautions / Restrictions Precautions Precautions: Fall Precaution Comments: SBP 130-150 Restrictions Weight Bearing Restrictions: No      Mobility Bed Mobility Overal bed mobility: Modified Independent                   Transfers Overall transfer level: Needs assistance Equipment used: None, 1 person hand held assist Transfers: Sit to/from Stand Sit to Stand: Contact guard assist           General transfer comment: Close CGA for rise and lower from EOB and toilet. pt with wide BOS to stabilize balance. use of UE to steady self.      Balance Overall balance assessment: Needs assistance Sitting-balance support: No upper extremity supported, Feet supported Sitting balance-Leahy Scale: Fair     Standing balance support: During functional activity, No upper extremity supported, Single extremity supported Standing balance-Leahy Scale: Fair Standing balance comment: at times reaching out for UE support, several losses of balance in bathroom                           ADL either performed or assessed with clinical judgement   ADL Overall ADL's : Needs assistance/impaired     Grooming: Contact guard assist;Standing;Brushing hair           Upper Body Dressing : Set up;Sitting   Lower Body Dressing: Minimal assistance;Sit to/from stand   Toilet Transfer: Minimal assistance;Contact guard assist;Ambulation;Regular Toilet;Grab bars   Toileting- Clothing Manipulation and Hygiene: Contact guard assist;Sit to/from stand       Functional mobility during ADLs: Contact guard assist;Minimal assistance;Cueing for safety       Vision Baseline Vision/History: 1 Wears glasses Ability to See in Adequate Light: 0 Adequate Patient Visual Report: No change from baseline Vision Assessment?: No apparent visual deficits     Perception         Praxis  Pertinent Vitals/Pain Pain Assessment Pain Assessment: 0-10 Pain Score: 4  Pain Location: Rt side of head and neck Pain Descriptors / Indicators: Aching, Headache Pain Intervention(s): Limited activity within patient's tolerance, Monitored during session, Repositioned     Extremity/Trunk Assessment Upper  Extremity Assessment Upper Extremity Assessment: Right hand dominant;LUE deficits/detail LUE Deficits / Details: slightly weaker on L side comapred to R (4/5 MMT) but sensation and coordination appear intaact LUE Sensation: WNL LUE Coordination: WNL      Cervical / Trunk Assessment Cervical / Trunk Assessment: Normal   Communication Communication Communication: No apparent difficulties Cueing Techniques: Verbal cues   Cognition Arousal: Alert Behavior During Therapy: WFL for tasks assessed/performed Overall Cognitive Status: Impaired/Different from baseline Area of Impairment: Attention, Problem solving, Awareness, Safety/judgement                   Current Attention Level: Sustained   Following Commands: Follows multi-step commands with increased time Safety/Judgement: Decreased awareness of safety, Decreased awareness of deficits Awareness: Emergent Problem Solving: Decreased initiation, Slow processing, Requires verbal cues General Comments: pt oriented and following commands.  He has slow processing and is somewhat tangential/looses attention to task. Would recommend pill box test.     General Comments  VSS with BP stable    Exercises     Shoulder Instructions      Home Living Family/patient expects to be discharged to:: Private residence Living Arrangements: Spouse/significant other Available Help at Discharge: Family Type of Home: House Home Access: Stairs to enter [employer] of Steps: 2 at garage (no rail) Entrance Stairs-Rails: None Home Layout: Multi-level;Full bath on main level (3 level) Alternate Level Stairs-Number of Steps: flight Alternate Level Stairs-Rails: Right Bathroom Shower/Tub: Tub/shower unit   Bathroom Toilet: Standard Bathroom Accessibility: Yes   Home Equipment: Cane - single point   Additional Comments: 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 enjoys golfing and traveling. Recently went to  [employer] in [place]. main floor of home is being renovated, has to go upstairs to sleep/bathe.      Prior Functioning/Environment Prior Level of Function : Driving;Independent/Modified Independent                        OT Problem List: Decreased activity tolerance;Pain;Decreased cognition;Impaired balance (sitting and/or standing);Decreased knowledge of use of DME or AE;Decreased knowledge of precautions      OT Treatment/Interventions: Self-care/ADL training;DME and/or AE instruction;Therapeutic activities;Cognitive remediation/compensation;Balance training;Patient/family education    OT Goals(Current goals can be found in the care plan section) Acute Rehab OT Goals Patient Stated Goal: home OT Goal Formulation: With patient Time For Goal Achievement: 06/27/23 Potential to Achieve Goals: Good  OT Frequency: Min 1X/week    Co-evaluation              AM-PAC OT "6 Clicks" Daily Activity     Outcome Measure Help from another person eating meals?: A Little Help from another person taking care of personal grooming?: A Little Help from another person toileting, which includes using toliet, bedpan, or urinal?: A Little Help from another person bathing (including washing, rinsing, drying)?: A Little Help from another person to put on and taking off regular upper body clothing?: A Little Help from another person to put on and taking off regular lower body clothing?: A Little 6 Click Score: 18   End of Session Equipment Utilized During Treatment: Gait belt Nurse Communication: Mobility status  Activity Tolerance: Patient tolerated treatment well  Patient left: in chair;with call bell/phone within reach;with chair alarm set  OT Visit Diagnosis: Other abnormalities of gait and mobility (R26.89);Pain Pain - part of body:  (headache)                Time: 1610-9604 OT Time Calculation (min): 36 min Charges:  OT General Charges $OT Visit: 1 Visit OT Evaluation $OT  Eval Moderate Complexity: 1 Mod  Barry Brunner, OT Acute Rehabilitation Services Office [phone]   Chancy Milroy 06/12/2023, 1:27 PM

## 2023-06-12 NOTE — Evaluation (Signed)
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   GOITER 01/21/2009   ATRIAL FIBRILLATION 01/21/2009   FATIGUE 01/21/2009   CHEST PAIN 01/21/2009   Past Medical History:  Past Medical History:  Diagnosis Date   Atrial fibrillation (HCC)    History of kidney stones 10-23-2011  SPONTANEOUSLY PASSED   History of migraine    Hyperthyroidism NO MEDS OR TX   MONITORED BY DR SOUTH   Mild acid reflux OCCASIONAL-  WATCHES DIET   PONV (postoperative nausea and vomiting)    Rosacea    Past Surgical History:  Past Surgical History:  Procedure Laterality Date   CARDIOVERSION  10-15-2007  DR Riley Kill   NEW ON-SET A-FIB   ELBOW SURGERY     right   ELBOW SURGERY     KNEE ARTHROSCOPY WITH LATERAL MENISECTOMY  08/07/2012   Procedure: KNEE ARTHROSCOPY WITH LATERAL MENISECTOMY;  Surgeon: Drucilla Schmidt, MD;  Location: [facility];  Service: Orthopedics;  Laterality: Left;  Left Knee Arthroscopy with Partial Lateral Menisectomy    KNEE SURGERY     RIGHT ELBOW SURGERY  1992 (APPROX)   TENDINITIS   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  09-12-2010  DR Derrell Lolling   LEFT LATERAL WALL   VASECTOMY REVERSAL  1990  (APPROX)   HPI:  Kyle [name] is a 77 yo male presenting to [facility] ED 9/18 with two day history of headache and lethargy. 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 Transferred to [facility] for ICU management. PMH includes A-fib on Eliquis, migraines, hyperthyroidism, mild acid reflux   Assessment / Plan / Recommendation Clinical Impression  Pt reports he lives at home with his  wife and is fully independent at baseline. He scored a 19/30 on the SLUMS (a score of 27 or above is considered WFL) characterized by deficits related to attention, memory (retrieval > storage), and problem solving. He appears to have good awareness of deficits and insight into how they may affect independence. Suspect he will progress given ongoing SLP f/u. Will continue to follow acutely for deficits listed above and recommend continued intervention on an OP basis upon d/c.    SLP Assessment  SLP Recommendation/Assessment: Patient needs continued Speech Lanaguage Pathology Services SLP Visit Diagnosis: Cognitive communication deficit (R41.841)    Recommendations for follow up therapy are one component of a multi-disciplinary discharge planning process, led by the attending physician.  Recommendations may be updated based on patient status, additional functional criteria and insurance authorization.    Follow Up Recommendations  Outpatient SLP    Assistance Recommended at Discharge  Frequent or constant Supervision/Assistance  Functional Status Assessment Patient has had a recent decline in their functional status and demonstrates the ability to make significant improvements in function in a reasonable and predictable amount of time.  Frequency and Duration min 2x/week  2 weeks      SLP Evaluation Cognition  Overall Cognitive Status: Impaired/Different from baseline Arousal/Alertness: Awake/alert Orientation Level: Oriented X4 Attention: Sustained Sustained Attention: Impaired Sustained Attention Impairment: Verbal basic Memory: Impaired Memory Impairment: Retrieval deficit Awareness: Impaired Awareness Impairment: Emergent impairment Problem Solving: Impaired Problem Solving Impairment: Verbal basic  Comprehension  Auditory Comprehension Overall Auditory Comprehension: Appears within functional limits for tasks assessed    Expression Expression Primary Mode of  Expression: Verbal Verbal Expression Overall Verbal Expression: Appears within functional limits for tasks assessed Written Expression Dominant Hand: Right   Oral / Motor  Oral Motor/Sensory Function Overall Oral Motor/Sensory Function: Within functional limits Motor Speech Overall Motor Speech: Appears within functional limits for tasks assessed            Gwynneth Aliment, M.A., CF-SLP Speech Language Pathology, Acute Rehabilitation Services  Secure Chat preferred [phone]  06/12/2023, 3:26 PM

## 2023-06-12 NOTE — Evaluation (Signed)
Physical Therapy Evaluation Patient Details Name: Kyle [name] MRN: [id number] DOB: [birth year] Today's Date: 06/12/2023  History of Present Illness  Pt is a 77 y/o male presenting on 9/18 with headache and lethargy. 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 PMH includes: afib, migraines, R elbow surgery, L knee surgery.   Clinical Impression  Kyle [name] is 77 y.o. male admitted with above HPI and diagnosis. Patient is currently limited by functional impairments below (see PT problem list). Patient lives with spouse and is independent with no AD at baseline. Overall strength is 4/5 or better throughout bil LE but some slight coordination deficits noted in LE's, symmetrical and pt noted to have slight tremor actively in bil UE>LE. Pt requires close CGA for sit<>stand transfers and HHA for gait with min assist to prevent LOB on 2 occasions while ambulating in bathroom and back to room. EOS pt expressed adequate safety awareness acknowledging that he needs assistance to mobilize due to balance deficits. Patient also expressed some understanding and knowledge of balance systems of the body; education provided on area of the brain in which hemorrhage occurred and impacts on coordination and balance. Patient will benefit from continued skilled PT interventions to address impairments and progress independence with mobility, recommending OPPT at neuro rehab center to address balance and coordination deficits. Acute PT will follow and progress as able.         If plan is discharge home, recommend the following: A little help with walking and/or transfers;A little help with bathing/dressing/bathroom;Assistance with cooking/housework;Assist for transportation;Help with stairs or ramp for entrance   Can travel by private vehicle        Equipment Recommendations  (TBD as pt progresses)  Recommendations for Other Services       Functional Status Assessment Patient  has had a recent decline in their functional status and demonstrates the ability to make significant improvements in function in a reasonable and predictable amount of time.     Precautions / Restrictions Precautions Precautions: Fall Precaution Comments: SBP 130-150 mmHg Restrictions Weight Bearing Restrictions: No      Mobility  Bed Mobility Overal bed mobility: Modified Independent             General bed mobility comments: use of bed features for supine>sit    Transfers Overall transfer level: Needs assistance Equipment used: None, 1 person hand held assist Transfers: Sit to/from Stand Sit to Stand: Contact guard assist           General transfer comment: Close CGA for rise and lower from EOB and toilet. pt with wide BOS to stabilize balance. use of UE to steady self.    Ambulation/Gait Ambulation/Gait assistance: Min assist Gait Distance (Feet): 30 Feet Assistive device: 1 person hand held assist, None Gait Pattern/deviations: Step-through pattern, Decreased stride length, Drifts right/left Gait velocity: decr     General Gait Details: pt usnteady with slightly wide BOS to stabilize self. Min assist with no device and balance improved with HHA on Lt side. pt has 2 episodes of LOB during gait to bathroom and back to bed.  Stairs            Wheelchair Mobility     Tilt Bed    Modified Rankin (Stroke Patients Only)       Balance  Pertinent Vitals/Pain Pain Assessment Pain Assessment: 0-10 Pain Location: Rt side of head and neck Pain Descriptors / Indicators: Aching, Headache Pain Intervention(s): Limited activity within patient's tolerance, Monitored during session, Repositioned    Home Living Family/patient expects to be discharged to:: Private residence Living Arrangements: Spouse/significant other Available Help at Discharge: Family Type of Home: House Home Access: Stairs  to enter Entrance Stairs-Rails: None Entrance Stairs-Number of Steps: 2 at garage (no rail) Alternate Level Stairs-Number of Steps: flight Home Layout: Multi-level;Full bath on main level (3 level) Home Equipment: Cane - single point Additional Comments: 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 enjoys golfing and traveling. Recently went to [employer] in [place]. main floor of home is being renovated, has to go upstairs to sleep/bathe.    Prior Function Prior Level of Function : Driving;Independent/Modified Independent                     Extremity/Trunk Assessment   Upper Extremity Assessment Upper Extremity Assessment: Right hand dominant;Defer to OT evaluation    Lower Extremity Assessment Lower Extremity Assessment: RLE deficits/detail;LLE deficits/detail RLE Deficits / Details: grossly 4+/5 throughout with hip flexors slightly weak bil. pt symmetrical fairly smooth HTS bil and intact RAM's. Pt has slight delay in response for light touch sensation on Rt foot. RLE Sensation: decreased light touch RLE Coordination: decreased gross motor LLE Deficits / Details: grossly 4+/5 throughout with hip flexors slightly weak bil. pt symmetrical fairly smooth HTS bil and intact RAM's. LLE Sensation: WNL LLE Coordination: decreased gross motor    Cervical / Trunk Assessment Cervical / Trunk Assessment: Normal  Communication   Communication Communication: No apparent difficulties Cueing Techniques: Verbal cues  Cognition Arousal: Alert Behavior During Therapy: WFL for tasks assessed/performed Overall Cognitive Status: Impaired/Different from baseline                                 General Comments: overall pt pleasanat following cues/commands well and engaged in conversation. Mostly oriented to situation though somewhat tangental drawn out explanation. pt seems to have possible delayed processing.        General Comments General  comments (skin integrity, edema, etc.): VSS SBP within range throughout.    Exercises     Assessment/Plan    PT Assessment Patient needs continued PT services  PT Problem List Decreased strength;Decreased activity tolerance;Decreased balance;Decreased mobility;Decreased coordination;Decreased cognition;Decreased knowledge of use of DME;Decreased safety awareness;Decreased knowledge of precautions;Impaired sensation;Cardiopulmonary status limiting activity       PT Treatment Interventions DME instruction;Gait training;Stair training;Functional mobility training;Therapeutic activities;Therapeutic exercise;Balance training;Neuromuscular re-education;Cognitive remediation;Patient/family education    PT Goals (Current goals can be found in the Care Plan section)  Acute Rehab PT Goals Patient Stated Goal: regain independence and get home PT Goal Formulation: With patient Time For Goal Achievement: 06/26/23 Potential to Achieve Goals: Good    Frequency Min 1X/week     Co-evaluation               AM-PAC PT "6 Clicks" Mobility  Outcome Measure Help needed turning from your back to your side while in a flat bed without using bedrails?: None Help needed moving from lying on your back to sitting on the side of a flat bed without using bedrails?: None Help needed moving to and from a bed to a chair (including a wheelchair)?: A Little Help needed standing up from a chair using your  arms (e.g., wheelchair or bedside chair)?: A Little Help needed to walk in hospital room?: A Little Help needed climbing 3-5 steps with a railing? : A Lot 6 Click Score: 19    End of Session Equipment Utilized During Treatment: Gait belt Activity Tolerance: Patient tolerated treatment well Patient left: in chair;with call bell/phone within reach;with chair alarm set Nurse Communication: Mobility status PT Visit Diagnosis: Unsteadiness on feet (R26.81);Other abnormalities of gait and mobility  (R26.89);Muscle weakness (generalized) (M62.81);Difficulty in walking, not elsewhere classified (R26.2);Other symptoms and signs involving the nervous system (R29.898)    Time: 0960-4540 PT Time Calculation (min) (ACUTE ONLY): 35 min   Charges:   PT Evaluation $PT Eval Low Complexity: 1 Low   PT General Charges $$ ACUTE PT VISIT: 1 Visit         Wynn Maudlin, DPT Acute Rehabilitation Services Office [phone]  06/12/23 1:15 PM

## 2023-06-12 NOTE — Progress Notes (Addendum)
Patient arrived via [facility] at 20:00. NIH 0, no deficits noted, neuro paged to bedside. Patient complain of 4/10 headache and neck pain and nausea. Verbal order for zofran received by Dr. Otelia Limes when arrived to bedside. Per Dr. Otelia Limes if swallow screen passed, puree diet to be ordered until speech is able to come evaluate further. Transported to MRI without complications, no more complains of nausea.  Stroke swallow screen held off until zofran given and we arrived back from MRI due to aspiration risk of laying flat. Stopped drinking water during swallow eval despite instructions given. Will reevaluate.   Per Dr. Otelia Limes, hold off on 2:00am CT scan. May want it spaced out further from MRI.

## 2023-06-12 NOTE — Progress Notes (Signed)
PT Cancellation Note  Patient Details Name: Kyle [name] MRN: [id number] DOB: May 23, 1946   Cancelled Treatment:    Reason Eval/Treat Not Completed: Patient not medically ready;Active bedrest order (Active bedrest order. Bed rest to end at 20:25 on 9/19 (today), PT will follow at later date/time when pt able and schedule allows.)   Renaldo Fiddler PT, DPT Acute Rehabilitation Services Office [phone]  06/12/23 9:00 AM

## 2023-06-12 NOTE — Progress Notes (Addendum)
OT Cancellation Note  Patient Details Name: Kyle [name] MRN: [id number] DOB: Jun 04, 1946   Cancelled Treatment:    Reason Eval/Treat Not Completed: Active bedrest order. Noted order to start at 2025 on 9/19 (today), OT will follow.   Barry Brunner, OT Acute Rehabilitation Services Office [phone]   Chancy Milroy 06/12/2023, 8:12 AM

## 2023-06-13 DIAGNOSIS — I610 Nontraumatic intracerebral hemorrhage in hemisphere, subcortical: Secondary | ICD-10-CM | POA: Diagnosis not present

## 2023-06-13 DIAGNOSIS — Z7901 Long term (current) use of anticoagulants: Secondary | ICD-10-CM | POA: Diagnosis not present

## 2023-06-13 LAB — CBC
HCT: 49.8 % (ref 39.0–52.0)
Hemoglobin: 16.7 g/dL (ref 13.0–17.0)
MCH: 30.6 pg (ref 26.0–34.0)
MCHC: 33.5 g/dL (ref 30.0–36.0)
MCV: 91.4 fL (ref 80.0–100.0)
Platelets: 144 10*3/uL — ABNORMAL LOW (ref 150–400)
RBC: 5.45 MIL/uL (ref 4.22–5.81)
RDW: 14.9 % (ref 11.5–15.5)
WBC: 7.5 10*3/uL (ref 4.0–10.5)
nRBC: 0 % (ref 0.0–0.2)

## 2023-06-13 LAB — BASIC METABOLIC PANEL
Anion gap: 13 (ref 5–15)
BUN: 14 mg/dL (ref 8–23)
CO2: 21 mmol/L — ABNORMAL LOW (ref 22–32)
Calcium: 8.7 mg/dL — ABNORMAL LOW (ref 8.9–10.3)
Chloride: 100 mmol/L (ref 98–111)
Creatinine, Ser: 0.96 mg/dL (ref 0.61–1.24)
GFR, Estimated: >60 mL/min (ref >60)
Glucose, Bld: 105 mg/dL — ABNORMAL HIGH (ref 70–99)
Potassium: 4.3 mmol/L (ref 3.5–5.1)
Sodium: 134 mmol/L — ABNORMAL LOW (ref 135–145)

## 2023-06-13 MED ORDER — METHIMAZOLE 10 MG PO TABS
10.0000 mg | ORAL_TABLET | Freq: Every day | ORAL | Status: DC
  Filled 2023-06-13: qty 1

## 2023-06-13 MED ORDER — ENOXAPARIN SODIUM 40 MG/0.4ML IJ SOSY
40.0000 mg | PREFILLED_SYRINGE | Freq: Every day | INTRAMUSCULAR | Status: DC
  Administered 2023-06-13: 40 mg via SUBCUTANEOUS
  Filled 2023-06-13: qty 0.4

## 2023-06-13 NOTE — Progress Notes (Signed)
Patient with DC orders. 2 PIV's removed. All discharge instructions reviewed with patient, wife, and son with good understanding verbalized. Patient brought downstairs via wc and all belongings. Vital stables, in no acute distress.

## 2023-06-13 NOTE — TOC Transition Note (Signed)
Transition of Care [facility]) - CM/SW Discharge Note   Patient Details  Name: Kyle [name] MRN: [id number] Date of Birth: July 31, 1946  Transition of Care [facility] CM/SW Contact:  Glennon Mac, RN Phone Number: 06/13/2023, 11:54 AM   Clinical Narrative:    Pt is a 77 y/o male presenting on 9/18 with headache and lethargy. 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 PMH includes: afib, migraines, R elbow surgery, L knee surgery. PTA, pt independent, lives at home with spouse. PT/OT recommending OP rehab, and pt/family agreeable to referrals.  Referrals made to [facility] at [facility] for PT/OT and ST follow up.   Wife states she is able to provide needed assistance at discharge.   Final next level of care: OP Rehab Barriers to Discharge: Barriers Resolved                         Discharge Plan and Services Additional resources added to the After Visit Summary for     Discharge Planning Services: CM Consult                                 Social Determinants of Health (SDOH) Interventions SDOH Screenings   Food Insecurity: No Food Insecurity (06/11/2023)  Housing: Low Risk  (06/11/2023)  Transportation Needs: No Transportation Needs (06/11/2023)  Utilities: Not At Risk (06/11/2023)  Tobacco Use: Low Risk  (06/11/2023)     Readmission Risk Interventions     No data to display         Quintella Baton, RN, BSN  Trauma/Neuro ICU Case Manager [phone]

## 2023-06-13 NOTE — Progress Notes (Signed)
Occupational Therapy Treatment Patient Details Name: Kyle [name]: [id number] DOB: Mar 05, 1946 Today's Date: 06/13/2023   History of present illness Pt is a 77 y/o male presenting on 9/18 with headache and lethargy. 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 PMH includes: afib, migraines, R elbow surgery, L knee surgery.   OT comments  Patient seated in recliner, spouse at side.  Functional cognition further assessed with The Pillbox Test: A Measure of Executive Functioning and Estimate of Medication Management. A straight pass/fail designation is determined by 3 or more errors of omission or misplacement on the task. The pt completed the test with less than 3 errors and passed the assessment. He made 1 error on assessment, but passed.  Pts awareness improving, but not baseline.  Educated pt and spouse on continued recommendations for OP OT, as well as assist with meds, cooking, finances, and driving.  Voiced understanding.        If plan is discharge home, recommend the following:  A little help with walking and/or transfers;A little help with bathing/dressing/bathroom;Assistance with cooking/housework;Assist for transportation;Direct supervision/assist for financial management;Direct supervision/assist for medications management;Help with stairs or ramp for entrance   Equipment Recommendations  Tub/shower seat    Recommendations for Other Services      Precautions / Restrictions Precautions Precautions: Fall Precaution Comments: SBP <160 Restrictions Weight Bearing Restrictions: No       Mobility Bed Mobility               General bed mobility comments: OOB in recliner    Transfers                         Balance                                           ADL either performed or assessed with clinical judgement   ADL                                         General ADL Comments:  focused on pill box test today    Extremity/Trunk Assessment              Vision       Perception     Praxis      Cognition Arousal: Alert Behavior During Therapy: WFL for tasks assessed/performed Overall Cognitive Status: Impaired/Different from baseline Area of Impairment: Attention, Problem solving, Awareness, Safety/judgement, Memory                   Current Attention Level: Sustained Memory: Decreased short-term memory Following Commands: Follows multi-step commands with increased time Safety/Judgement: Decreased awareness of safety, Decreased awareness of deficits Awareness: Emergent Problem Solving: Decreased initiation, Slow processing, Requires verbal cues General Comments: pill box test completed, see below for details.  Pt continues to demonstrate some decreased awareness to deficits and safety, voiced understanding with recommendations after education        Exercises      Shoulder Instructions       General Comments BP stable    Pertinent Vitals/ Pain       Pain Assessment Pain Assessment: Faces Faces Pain Scale: Hurts a little bit Pain Location: Rt side of head  and neck Pain Descriptors / Indicators: Aching, Headache Pain Intervention(s): Limited activity within patient's tolerance, Monitored during session, Repositioned  Home Living                                          Prior Functioning/Environment              Frequency  Min 1X/week        Progress Toward Goals  OT Goals(current goals can now be found in the care plan section)  Progress towards OT goals: Progressing toward goals  Acute Rehab OT Goals Patient Stated Goal: home OT Goal Formulation: With patient Time For Goal Achievement: 06/27/23 Potential to Achieve Goals: Good  Plan      Co-evaluation                 AM-PAC OT "6 Clicks" Daily Activity     Outcome Measure   Help from another person eating meals?: None Help from  another person taking care of personal grooming?: A Little Help from another person toileting, which includes using toliet, bedpan, or urinal?: A Little Help from another person bathing (including washing, rinsing, drying)?: A Little Help from another person to put on and taking off regular upper body clothing?: A Little Help from another person to put on and taking off regular lower body clothing?: A Little 6 Click Score: 19    End of Session    OT Visit Diagnosis: Other abnormalities of gait and mobility (R26.89);Pain Pain - part of body:  (headache)   Activity Tolerance Patient tolerated treatment well   Patient Left in chair;with call bell/phone within reach;with chair alarm set;with family/visitor present   Nurse Communication Mobility status        Time: 1610-9604 OT Time Calculation (min): 23 min  Charges: OT General Charges $OT Visit: 1 Visit OT Treatments $Self Care/Home Management : 23-37 mins  Barry Brunner, OT Acute Rehabilitation Services Office [phone]   Chancy Milroy 06/13/2023, 12:55 PM

## 2023-06-13 NOTE — Discharge Summary (Addendum)
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   furosemide 20 MG tablet Commonly known as: LASIX TAKE 1 TABLET (20 MG TOTAL) BY MOUTH AS NEEDED (LOWER LEG SWELLING).   methimazole 10 MG tablet Commonly known as: TAPAZOLE Take 10 mg by mouth daily.   rosuvastatin 20 MG tablet Commonly known as: CRESTOR TAKE 1 TABLET DAILY. SCHEDULE OFFICE VISIT FOR FUTURE REFILLS        LABORATORY STUDIES CBC    Component Value Date/Time   WBC 7.5 06/13/2023 0719   RBC 5.45 06/13/2023 0719   HGB 16.7 06/13/2023 0719   HGB 15.8 11/20/2017 0952   HCT 49.8 06/13/2023 0719   HCT 45.9 11/20/2017 0952   PLT 144 (L) 06/13/2023 0719   PLT 210 11/20/2017 0952   MCV 91.4 06/13/2023 0719   MCV 93 11/20/2017 0952   MCH 30.6 06/13/2023 0719   MCHC 33.5 06/13/2023 0719   RDW 14.9 06/13/2023 0719   RDW 13.8 11/20/2017 0952   LYMPHSABS 1.8 09/07/2010 0945   MONOABS 0.6 09/07/2010 0945   EOSABS 0.3 09/07/2010 0945   BASOSABS 0.0 09/07/2010 0945   CMP    Component Value Date/Time   NA 134 (L) 06/13/2023 0719   NA 142 02/14/2022 0913   K 4.3 06/13/2023 0719   CL 100 06/13/2023 0719   CO2 21 (L) 06/13/2023 0719   GLUCOSE 105 (H) 06/13/2023 0719   BUN 14 06/13/2023 0719   BUN 18  02/14/2022 0913   CREATININE 0.96 06/13/2023 0719   CREATININE 1.02 12/02/2016 0951   CALCIUM 8.7 (L) 06/13/2023 0719   PROT 6.1 01/21/2022 1155   ALBUMIN 4.1 01/21/2022 1155   AST 22 01/21/2022 1155   ALT 23 01/21/2022 1155   ALKPHOS 67 01/21/2022 1155   BILITOT 1.0 01/21/2022 1155   GFRNONAA >60 06/13/2023 0719   GFRNONAA 72 04/24/2015 0845   GFRAA 87 11/20/2017 0952   GFRAA 84 04/24/2015 0845   COAGS Lab Results  Component Value Date   INR 2.2 07/13/2009   INR 2.4 06/15/2009   INR 2.3 05/18/2009   INR 2.3 05/18/2009   PROTIME 19.7 02/23/2009   Lipid Panel    Component Value Date/Time   CHOL 101 06/12/2023 1038   CHOL 77 (L) 01/21/2022 1155   TRIG 42 06/12/2023 1038   HDL 32 (L) 06/12/2023 1038   HDL 28 (L) 01/21/2022 1155   CHOLHDL 3.2 06/12/2023 1038   VLDL 8 06/12/2023 1038   LDLCALC 61 06/12/2023 1038   LDLCALC 35 01/21/2022 1155   HgbA1C  Lab Results  Component Value Date   HGBA1C 6.0 (H) 06/12/2023   Urine Drug Screen  Alcohol Level No results found for: "ETH"   SIGNIFICANT DIAGNOSTIC STUDIES CT ANGIO HEAD NECK W WO CM  Result Date: 06/12/2023 CLINICAL DATA:  Stroke, hemorrhagic EXAM: CT ANGIOGRAPHY HEAD AND NECK WITH AND WITHOUT CONTRAST TECHNIQUE: Multidetector CT imaging of the head and neck was performed using the standard protocol during bolus administration of intravenous contrast. Multiplanar CT image reconstructions and MIPs were obtained to evaluate the vascular anatomy. Carotid stenosis measurements (when applicable) are obtained utilizing NASCET criteria, using the distal internal carotid diameter as the denominator. RADIATION DOSE REDUCTION: This exam was performed according to the departmental dose-optimization program which includes automated exposure control, adjustment of the mA and/or kV according to patient size and/or use of iterative reconstruction technique. CONTRAST:  75mL OMNIPAQUE IOHEXOL 350 MG/ML SOLN COMPARISON:  CT Head 06/11/23  FINDINGS: CT HEAD FINDINGS Brain: Slight interval decrease in size of right frontal lobe parenchymal hemorrhage now measuring 4.1 x 2.6 x 2.8 cm, previously 4.5 x 2.7 x 2.9 cm. Mild surrounding vasogenic edema is unchanged. Unchanged 8 mm leftward midline shift. No new sites of hemorrhage. No extra-axial fluid collection. There is sequela of moderate to severe chronic microvascular ischemic change. No new sites of infarct. Vascular: No hyperdense vessel or unexpected calcification. Skull: Normal. Negative for fracture or focal lesion. Sinuses/Orbits: No middle ear or mastoid effusion. Paranasal sinuses are clear. Orbits are unremarkable. Other: None. Review of the MIP images confirms the above findings CTA NECK FINDINGS Aortic arch: Standard branching. Imaged portion shows no evidence of aneurysm or dissection. No significant stenosis of the major arch vessel origins. Right carotid system: No evidence of dissection, stenosis (50% or greater), or occlusion. Left carotid system: No evidence of dissection, stenosis (50% or greater), or occlusion. Vertebral arteries: Codominant. No evidence of dissection, stenosis (50% or greater), or occlusion. Skeleton: Negative. Other neck: Negative. Upper chest: Negative. Review of the MIP images confirms the above findings CTA HEAD FINDINGS Anterior circulation: No significant stenosis, proximal occlusion, aneurysm, or vascular malformation. Posterior circulation: No significant stenosis, proximal occlusion, aneurysm, or vascular malformation. Venous sinuses: As permitted by contrast timing, patent. Anatomic variants: None Review of the MIP images confirms the above findings IMPRESSION: 1. Slight interval decrease in size of right frontal lobe parenchymal hemorrhage now measuring 4.1 x 2.6 x 2.8 cm, previously 4.5 x 2.7 x 2.9 cm. Unchanged 8 mm leftward midline shift. 2. No intracranial large vessel occlusion or significant stenosis. 3. No hemodynamically significant stenosis in  the neck. Electronically Signed   By: Lorenza Cambridge M.D.   On: 06/12/2023 19:56   ECHOCARDIOGRAM COMPLETE  Result Date: 06/12/2023    ECHOCARDIOGRAM REPORT   Patient Name:   LONGINO ENGE Date of Exam: 06/12/2023 Medical Rec #:  [id number]    Height:       73.0 in Accession #:    [id number]   Weight:       203.9 lb Date of Birth:  09/17/1946     BSA:          2.169 m Patient Age:    77 years     BP:           137/92 mmHg Patient Gender: M            HR:           72 bpm. Exam Location:  Inpatient Procedure: 2D Echo, Cardiac Doppler and Color Doppler Indications:    Stroke I63.9  History:        Patient has  prior history of Echocardiogram examinations, most                 recent 04/05/2022. Arrythmias:Atrial Fibrillation.  Sonographer:    Harriette Bouillon RDCS Referring Phys: ERIC LINDZEN IMPRESSIONS  1. Left ventricular ejection fraction, by estimation, is 55 to 60%. The left ventricle has normal function. The left ventricle has no regional wall motion abnormalities. Left ventricular diastolic parameters are indeterminate.  2. Right ventricular systolic function is normal. The right ventricular size is mildly enlarged. Tricuspid regurgitation signal is inadequate for assessing PA pressure.  3. Left atrial size was severely dilated.  4. Right atrial size was moderately dilated.  5. The mitral valve was not well visualized. Trivial mitral valve regurgitation. No evidence of mitral stenosis.  6. The aortic valve is tricuspid. There is mild calcification of the aortic valve. Aortic valve regurgitation is not visualized. No aortic stenosis is present.  7. The inferior vena cava is dilated in size with <50% respiratory variability, suggesting right atrial pressure of 15 mmHg. Comparison(s): Prior images reviewed side by side. Tricuspid regurgitation has improved. FINDINGS  Left Ventricle: Left ventricular ejection fraction, by estimation, is 55 to 60%. The left ventricle has normal function. The left ventricle has no  regional wall motion abnormalities. The left ventricular internal cavity size was normal in size. There is  no left ventricular hypertrophy. Left ventricular diastolic parameters are indeterminate. Right Ventricle: The right ventricular size is mildly enlarged. No increase in right ventricular wall thickness. Right ventricular systolic function is normal. Tricuspid regurgitation signal is inadequate for assessing PA pressure. Left Atrium: Left atrial size was severely dilated. Right Atrium: Right atrial size was moderately dilated. Pericardium: There is no evidence of pericardial effusion. Mitral Valve: The mitral valve was not well visualized. Trivial mitral valve regurgitation. No evidence of mitral valve stenosis. Tricuspid Valve: The tricuspid valve is normal in structure. Tricuspid valve regurgitation is not demonstrated. No evidence of tricuspid stenosis. Aortic Valve: The aortic valve is tricuspid. There is mild calcification of the aortic valve. Aortic valve regurgitation is not visualized. No aortic stenosis is present. Pulmonic Valve: The pulmonic valve was normal in structure. Pulmonic valve regurgitation is trivial. No evidence of pulmonic stenosis. Aorta: The aortic root and ascending aorta are structurally normal, with no evidence of dilitation. Venous: The inferior vena cava is dilated in size with less than 50% respiratory variability, suggesting right atrial pressure of 15 mmHg. IAS/Shunts: No atrial level shunt detected by color flow Doppler.  LEFT VENTRICLE PLAX 2D LVIDd:         4.90 cm LVIDs:         3.30 cm LV PW:         0.90 cm LV IVS:        0.90 cm LVOT diam:     2.00 cm LV SV:         57 LV SV Index:   26 LVOT Area:     3.14 cm  RIGHT VENTRICLE             IVC RV S prime:     11.60 cm/s  IVC diam: 2.50 cm TAPSE (M-mode): 1.7 cm LEFT ATRIUM              Index        RIGHT ATRIUM           Index LA diam:        4.70 cm  2.17 cm/m   RA Area:  28.40 cm LA Vol (A2C):   110.0 ml 50.70  ml/m  RA Volume:   87.20 ml  40.19 ml/m LA Vol (A4C):   108.0 ml 49.78 ml/m LA Biplane Vol: 111.0 ml 51.16 ml/m  AORTIC VALVE LVOT Vmax:   90.20 cm/s LVOT Vmean:  59.700 cm/s LVOT VTI:    0.180 m  AORTA Ao Root diam: 3.40 cm Ao Asc diam:  3.20 cm  SHUNTS Systemic VTI:  0.18 m Systemic Diam: 2.00 cm Riley Lam MD Electronically signed by Riley Lam MD Signature Date/Time: 06/12/2023/12:31:23 PM    Final    MR BRAIN W WO CONTRAST  Result Date: 06/12/2023 CLINICAL DATA:  Hemorrhagic stroke EXAM: MRI HEAD WITHOUT AND WITH CONTRAST TECHNIQUE: Multiplanar, multiecho pulse sequences of the brain and surrounding structures were obtained without and with intravenous contrast. CONTRAST:  9mL GADAVIST GADOBUTROL 1 MMOL/ML IV SOLN COMPARISON:  06/11/2023 CT head, no prior MRI available FINDINGS: Evaluation is limited by motion artifact brain: Redemonstrated acute intraparenchymal hematoma centered in the right basal ganglia, measuring 4.3 x 3.0 x 2.9 cm (AP x TR x CC) (series 12, image 13 and series 16, image 20), previously 4.5 x 2.7 x 3.0 cm, overall unchanged when accounting for differences scan plane. Surrounding restricted diffusion may be related to susceptibility or associated infarcted parenchyma. Surrounding T2 hyperintense signal, consistent with edema. 7 mm of right-to-left midline shift no evidence of underlying mass. No evidence of active extravasation. Layering hemorrhage in occipital horns, third ventricle, and fourth ventricle. The hemorrhage and associated edema causes mass effect on the right greater than left body and frontal horn of the lateral ventricles. No evidence of entrapment or hydrocephalus. Foci of hemosiderin deposition in the bilateral cerebral and cerebellar hemispheres, with likely sequela of remote subarachnoid hemorrhage. Possible punctate foci of restricted diffusion in the posterior right frontal lobe (series 5, images 97 and 98). Vascular: Normal arterial flow  voids. Normal arterial and venous enhancement. Skull and upper cervical spine: Normal marrow signal. Sinuses/Orbits: Mucosal thickening in the right maxillary sinus and ethmoid air cells. No acute finding in the orbits. Other: Fluid in the right-greater-than-left mastoid air cells. IMPRESSION: 1. Evaluation is limited by motion artifact. Within this limitation, redemonstrated acute intraparenchymal hematoma centered in the right basal ganglia, overall unchanged compared to the prior CT, with surrounding edema and 7 mm of right-to-left midline shift. No evidence of underlying mass or active extravasation. 2. Layering hemorrhage in the occipital horns, third ventricle, and fourth ventricle. Mass effect on the right greater than left body and frontal horn of the lateral ventricles without evidence of entrapment or hydrocephalus. 3. Possible punctate foci of restricted diffusion in the posterior right frontal lobe, which may represent additional foci of acute or subacute infarct. 4. Foci of hemosiderin deposition in the bilateral cerebral and cerebellar hemispheres, with likely sequela of remote subarachnoid hemorrhage. This could represent prior hypertensive microhemorrhages but could also be seen in the setting of cerebral amyloid angiopathy. Electronically Signed   By: Wiliam Ke M.D.   On: 06/12/2023 02:07   CT ANGIO HEAD NECK W WO CM  Result Date: 06/11/2023 CLINICAL DATA:  Headache and neck pain beginning yesterday. Intracranial hemorrhage. EXAM: CT ANGIOGRAPHY HEAD AND NECK WITH AND WITHOUT CONTRAST TECHNIQUE: Multidetector CT imaging of the head and neck was performed using the standard protocol during bolus administration of intravenous contrast. Multiplanar CT image reconstructions and MIPs were obtained to evaluate the vascular anatomy. Carotid stenosis measurements (when applicable) are obtained utilizing NASCET criteria,  using the distal internal carotid diameter as the denominator. RADIATION DOSE  REDUCTION: This exam was performed according to the departmental dose-optimization program which includes automated exposure control, adjustment of the mA and/or kV according to patient size and/or use of iterative reconstruction technique. CONTRAST:  75mL OMNIPAQUE IOHEXOL 350 MG/ML SOLN COMPARISON:  CT head without contrast 06/11/2023 at 12:26 p.m. FINDINGS: CT HEAD FINDINGS Brain: Acute hemorrhage in the anterior inferior right frontal lobe is stable measuring 4.3 x 2.7 cm. The thin area of surrounding vasogenic edema is present. Subfalcine herniation measures 8 mm. No other foci of hemorrhage are present. No significant white matter disease is present. Ventricles are of normal size. The brainstem and cerebellum are within normal limits. Midline structures are within normal limits. Vascular: No hyperdense vessel or unexpected calcification. Skull: Calvarium is intact. No focal lytic or blastic lesions are present. No significant extracranial soft tissue lesion is present. Sinuses/Orbits: The paranasal sinuses and mastoid air cells are clear. The globes and orbits are within normal limits. Review of the MIP images confirms the above findings CTA NECK FINDINGS Aortic arch: A 3 vessel arch configuration is present. Minimal calcification is present in the distal aortic arch. No focal disease is present at the great vessel origins. No aneurysm or stenosis is present. Right carotid system: The right common carotid artery is within normal limits. The bifurcation is unremarkable. The cervical right ICA is normal. Left carotid system: The left common carotid artery is within normal limits. Focal calcification is present in the proximal left ICA without focal stenosis. The cervical left ICA is otherwise normal. Vertebral arteries: The vertebral arteries are codominant. No significant stenosis is present in either vertebral artery in the neck. Both vessels originate from the subclavian arteries without significant stenosis.  Skeleton: The vertebral body heights and alignment are normal. No focal osseous lesions are present. Other neck: Soft tissues the neck are otherwise unremarkable. Salivary glands are within normal limits. Thyroid is normal. No significant adenopathy is present. No focal mucosal or submucosal lesions are present. Upper chest: The lung apices are clear. The thoracic inlet is within normal limits. Review of the MIP images confirms the above findings CTA HEAD FINDINGS Anterior circulation: Atherosclerotic calcifications are present within the cavernous internal carotid arteries. The A1 and M1 segments are within normal limits. Right A1 and M1 are slightly displaced by the hemorrhage. No stenosis or occlusion is present. No aneurysm is present. The MCA bifurcations are within normal limits bilaterally. The ACA and MCA branch vessels are normal bilaterally. Posterior circulation: The vertebral arteries are codominant. PICA origins are visualized. The vertebrobasilar junction is normal. The basilar artery is normal. Superior cerebellar arteries are patent. Both posterior cerebral arteries originate from the basilar tip. The PCA branch vessels are normal bilaterally. No aneurysm is present. Venous sinuses: The dural sinuses are patent. The straight sinus and deep cerebral veins are intact. Cortical veins are within normal limits. No significant vascular malformation is evident. Anatomic variants: None Review of the MIP images confirms the above findings IMPRESSION: 1. Stable 4.3 x 2.7 cm acute hemorrhage in the anterior inferior right frontal lobe with thin area of surrounding vasogenic edema. 2. Subfalcine herniation measures 8 mm. 3. Right A1 and M1 segments are slightly displaced by the hemorrhage. 4. No significant vascular malformation or aneurysm. 5. Minimal atherosclerotic changes at the proximal left ICA and cavernous internal carotid arteries without significant stenosis. 6. Otherwise normal CTA Circle of Willis  without significant proximal stenosis, aneurysm, or branch  vessel occlusion. Electronically Signed   By: Marin Roberts M.D.   On: 06/11/2023 15:11   CT HEAD WO CONTRAST ( )  Result Date: 06/11/2023 CLINICAL DATA:  Headache, sudden, severe HA X 2 days, different from prior. EXAM: CT HEAD WITHOUT CONTRAST TECHNIQUE: Contiguous axial images were obtained from the base of the skull through the vertex without intravenous contrast. RADIATION DOSE REDUCTION: This exam was performed according to the departmental dose-optimization program which includes automated exposure control, adjustment of the mA and/or kV according to patient size and/or use of iterative reconstruction technique. COMPARISON:  None Available. FINDINGS: Brain: Acute intraparenchymal hematoma centered within the right basal ganglia and basal forebrain, measuring up to 4.5 x 2.7 x 3.0 cm (axial image 11 series 4, coronal image 45 series 6). Small amount of surrounding perilesional edema/infarct. No evidence of extra-axial component or intraventricular extension. Mild subfalcine herniation. Vascular: No hyperdense vessel or unexpected calcification. Skull: No calvarial fracture or suspicious bone lesion. Skull base is unremarkable. Sinuses/Orbits: No acute finding. Other: None. IMPRESSION: Acute intraparenchymal hematoma centered within the right basal ganglia and basal forebrain, measuring up to 4.5 cm. Small amount of surrounding perilesional edema/infarct. Mild subfalcine herniation. No evidence of extra-axial component or intraventricular extension. Critical Value/emergent results were called by telephone at the time of interpretation on 06/11/2023 at 12:40 pm to provider ANDREW TEE , who verbally acknowledged these results. Electronically Signed   By: Orvan Falconer M.D.   On: 06/11/2023 12:44       HISTORY OF PRESENT ILLNESS Mr. BENNET SPECTOR is a 77 y.o. male with history of A-fib on Eliquis, migraines, hypothyroidism, GERD and rosacea  presenting with a 2-day history of headache and lethargy.  CT head in the ED revealed an early subacute right basal ganglia hemorrhage with no IVH.  Eliquis was reversed with Andexxa, and no spot sign was seen on CTA.  Patient was admitted to the ICU, and blood pressure has remained stable without intervention. Anticoagulation discontinued due to ICH.  He he plans to follow-up outpatient with [facility] to discuss resuming AC versus watchman device versus ASPIRE clinical trial.    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 in setting of Eliquis use CT head acute IPH centered within right basal ganglia and basal for brain CTA head & neck stable acute hemorrhage in the anterior inferior right frontal lobe with surrounding vasogenic edema, and midline shift of 8 mm, right A1 and M1 segment slightly displaced by the hemorrhage, no LVO or other significant stenosis MRI acute IPH in right basal ganglia with 7 mm right to left midline shift, layering hemorrhage in the occipital horns, third ventricle and fourth ventricle, no evidence of entrapment or hydrocephalus, possible punctate foci of restricted diffusion in posterior right frontal lobe 2D Echo EF 55 to 60%, left atrial size severely dilated, right atrium moderately dilated, no atrial level shunt LDL 35 HgbA1c 6.0 VTE prophylaxis -SCDs Eliquis (apixaban) daily prior to admission, now on No antithrombotic secondary to IPH Therapy recommendations:  Outpatient PT/OT/ST Disposition: home, follow up with Dr. Pearlean Brownie in 4 weeks.   Atrial fibrillation Home Meds: Eliquis S/p Andexxa  Continue telemetry monitoring Hold anticoagulation for now given Eliquis, will consider resumption of anticoagulation once hematoma resolved versus Watchman procedure vs. ASPIRE trial. Follow up with Dr. Jens Som    Hypertension Home meds: None Stable Blood Pressure Goal: SBP between 130-150 for 24 hours  and then less than 160    Hyperlipidemia Home  meds: Rosuvastatin 20 mg daily LDL 35, goal < 70 Resume home crestor 20 Continue statin at discharge   Other Stroke Risk Factors Migraines     Other Active Problems Hyperthyroidism - on home methimazole  Transient Temp 100.8 without clear etiology   DISCHARGE EXAM  General:  Alert, well-nourished, well-developed patient in no acute distress Psych:  Mood and affect appropriate for situation CV: Regular rate and rhythm on monitor Respiratory:  Regular, unlabored respirations on room air     NEURO:  Mental Status: AA&Ox3, patient is able to give clear and coherent history Speech/Language: speech is without dysarthria or aphasia.     Cranial Nerves:  II: PERRL.  III, IV, VI: EOMI. Eyelids elevate symmetrically.  V: Sensation is intact to light touch and symmetrical to face.  VII: Face is symmetrical resting and smiling VIII: hearing intact to voice. IX, X: Phonation is normal.  WU:[id number] shrug 5/5. XII: tongue is midline without fasciculations. Motor: 5/5 strength to all muscle groups tested.  Tone: is normal and bulk is normal Sensation- Intact to light touch bilaterally. Coordination: FTN intact bilaterally.No drift.  Gait- deferred     Discharge Diet       Diet   Diet Heart Room service appropriate? Yes; Fluid consistency: Thin   liquids  DISCHARGE PLAN Disposition: Home No antithrombotic  Ongoing stroke risk factor control by Primary Care Physician at time of discharge Follow-up PCP Adrian Prince, MD in 2 weeks. Follow-up in [facility] with Dr. Pearlean Brownie in 3-4 weeks, office to schedule an appointment.   35 minutes were spent preparing discharge.  Patient seen and examined by NP/APP with MD. MD to update note as needed.   Elmer Picker, DNP, FNP-BC [facility] Pager: [phone]  ATTENDING NOTE: I reviewed above note and agree with the assessment and plan.  Pt was seen and examined.   No family at the bedside. Pt sitting in chair, still has mild HA and neck pain but getting slowly better. Neuro intact at this time. PT and OT recommend outpt. Currently holding off AC due to ICH. Will follow up with Dr. Pearlean Brownie in 3-4 weeks to consider further options about resume AC vs. Watchman vs. ASPIRE trial. Ready for d/c home.   For detailed assessment and plan, please refer to above/below as I have made changes wherever appropriate.   Marvel Plan, MD PhD Stroke Neurology 06/13/2023 10:59 PM

## 2023-06-13 NOTE — Progress Notes (Signed)
Physical Therapy Treatment Patient Details Name: Kyle [name] MRN: [id number] DOB: 11-30-1945 Today's Date: 06/13/2023   History of Present Illness Pt is a 77 y/o male presenting on 9/18 with headache and lethargy. 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 PMH includes: afib, migraines, R elbow surgery, L knee surgery.    PT Comments  Patient resting in bed and reports ongoing slight head and neck ache but recently medicated and agreeable to therapy. Pt mod ind with bed mobility and supervision for sit<>stand tranfers today. Gait training progressed for distance and dual task. Pt given instructions on unit layout and provided 3 post-it notes with 3 room numbers labeled. Pt navigated around unit in most efficient path in order of (15, 28, 22) with no error or cues needed. Pt was noted to have slight ongoing tremor, low foot clearance and 1 instance of decreased attention to Lt bumping into door while ambulating. Continue to recommend OPPT follow up and discussed benefits for [facility] or walking pole for ambulation at this time. Will continue to progress as able.    If plan is discharge home, recommend the following: A little help with walking and/or transfers;A little help with bathing/dressing/bathroom;Assistance with cooking/housework;Assist for transportation;Help with stairs or ramp for entrance   Can travel by private vehicle        Equipment Recommendations   (pt has SPC and walking pole at home, recommend use in Rt UE)    Recommendations for Other Services       Precautions / Restrictions Precautions Precautions: Fall Precaution Comments: SBP <160 Restrictions Weight Bearing Restrictions: No     Mobility  Bed Mobility Overal bed mobility: Modified Independent             General bed mobility comments: use of bed features for supine>sit    Transfers Overall transfer level: Needs assistance Equipment used: None, 1 person hand held  assist Transfers: Sit to/from Stand Sit to Stand: Supervision           General transfer comment: steady with rise and lower from EOB and recliner with use of hands. supervision for safety    Ambulation/Gait Ambulation/Gait assistance: Contact guard assist, Min assist Gait Distance (Feet): 220 Feet Assistive device: 1 person hand held assist, None Gait Pattern/deviations: Step-through pattern, Decreased stride length, Drifts right/left Gait velocity: decr     General Gait Details: pt unsteady and reaching out for support with Rt UE needing min assist to stabilize balance. pt required provided Rt HHA and more stable with gait. continues to have slight tremor and overall low foot clearance with steps. pt with poor awareness to door/obstacles at start of gait and bumping his side into it.   Stairs             Wheelchair Mobility     Tilt Bed    Modified Rankin (Stroke Patients Only)       Balance Overall balance assessment: Needs assistance Sitting-balance support: No upper extremity supported, Feet supported Sitting balance-Leahy Scale: Fair     Standing balance support: During functional activity, No upper extremity supported, Single extremity supported Standing balance-Leahy Scale: Fair Standing balance comment: at times reaching out for UE support, several losses of balance in bathroom                            Cognition Arousal: Alert Behavior During Therapy: WFL for tasks assessed/performed Overall Cognitive Status: Impaired/Different  from baseline Area of Impairment: Attention, Problem solving, Awareness, Safety/judgement                   Current Attention Level: Sustained   Following Commands: Follows multi-step commands with increased time Safety/Judgement: Decreased awareness of safety, Decreased awareness of deficits Awareness: Emergent Problem Solving: Decreased initiation, Slow processing, Requires verbal cues           Exercises      General Comments        Pertinent Vitals/Pain Pain Assessment Pain Assessment: Faces Faces Pain Scale: Hurts a little bit Pain Location: Rt side of head and neck Pain Descriptors / Indicators: Aching, Headache Pain Intervention(s): Limited activity within patient's tolerance, Monitored during session, Premedicated before session    Home Living                          Prior Function            PT Goals (current goals can now be found in the care plan section) Acute Rehab PT Goals Patient Stated Goal: regain independence and get home PT Goal Formulation: With patient Time For Goal Achievement: 06/26/23 Potential to Achieve Goals: Good Progress towards PT goals: Progressing toward goals    Frequency    Min 1X/week      PT Plan      Co-evaluation              AM-PAC PT "6 Clicks" Mobility   Outcome Measure  Help needed turning from your back to your side while in a flat bed without using bedrails?: None Help needed moving from lying on your back to sitting on the side of a flat bed without using bedrails?: None Help needed moving to and from a bed to a chair (including a wheelchair)?: A Little Help needed standing up from a chair using your arms (e.g., wheelchair or bedside chair)?: A Little Help needed to walk in hospital room?: A Little Help needed climbing 3-5 steps with a railing? : A Little 6 Click Score: 20    End of Session Equipment Utilized During Treatment: Gait belt Activity Tolerance: Patient tolerated treatment well Patient left: in chair;with call bell/phone within reach;with chair alarm set Nurse Communication: Mobility status PT Visit Diagnosis: Unsteadiness on feet (R26.81);Other abnormalities of gait and mobility (R26.89);Muscle weakness (generalized) (M62.81);Difficulty in walking, not elsewhere classified (R26.2);Other symptoms and signs involving the nervous system (R29.898)     Time: 1037-1100 PT Time  Calculation (min) (ACUTE ONLY): 23 min  Charges:    $Gait Training: 23-37 mins PT General Charges $$ ACUTE PT VISIT: 1 Visit                     Wynn Maudlin, DPT Acute Rehabilitation Services Office [phone]  06/13/23 11:12 AM

## 2023-06-14 ENCOUNTER — Emergency Department (HOSPITAL_COMMUNITY): Payer: Medicare Other

## 2023-06-14 ENCOUNTER — Encounter (HOSPITAL_COMMUNITY): Payer: Self-pay

## 2023-06-14 ENCOUNTER — Emergency Department (HOSPITAL_COMMUNITY)
Admission: EM | Admit: 2023-06-14 | Discharge: 2023-06-15 | Disposition: A | Discharge status: Home / Self Care | Payer: Medicare Other | Attending: Emergency Medicine | Admitting: Emergency Medicine

## 2023-06-14 DIAGNOSIS — R519 Headache, unspecified: Secondary | ICD-10-CM | POA: Insufficient documentation

## 2023-06-14 DIAGNOSIS — E871 Hypo-osmolality and hyponatremia: Secondary | ICD-10-CM | POA: Insufficient documentation

## 2023-06-14 DIAGNOSIS — G441 Vascular headache, not elsewhere classified: Secondary | ICD-10-CM | POA: Diagnosis not present

## 2023-06-14 DIAGNOSIS — I629 Nontraumatic intracranial hemorrhage, unspecified: Secondary | ICD-10-CM | POA: Insufficient documentation

## 2023-06-14 LAB — COMPREHENSIVE METABOLIC PANEL
ALT: 15 U/L (ref 0–44)
AST: 20 U/L (ref 15–41)
Albumin: 3.9 g/dL (ref 3.5–5.0)
Alkaline Phosphatase: 62 U/L (ref 38–126)
Anion gap: 10 (ref 5–15)
BUN: 16 mg/dL (ref 8–23)
CO2: 24 mmol/L (ref 22–32)
Calcium: 8.5 mg/dL — ABNORMAL LOW (ref 8.9–10.3)
Chloride: 100 mmol/L (ref 98–111)
Creatinine, Ser: 1.04 mg/dL (ref 0.61–1.24)
GFR, Estimated: >60 mL/min (ref >60)
Glucose, Bld: 140 mg/dL — ABNORMAL HIGH (ref 70–99)
Potassium: 4.3 mmol/L (ref 3.5–5.1)
Sodium: 134 mmol/L — ABNORMAL LOW (ref 135–145)
Total Bilirubin: 1.9 mg/dL — ABNORMAL HIGH (ref 0.3–1.2)
Total Protein: 6.6 g/dL (ref 6.5–8.1)

## 2023-06-14 LAB — PROTIME-INR
INR: 1.1 (ref 0.8–1.2)
Prothrombin Time: 14.8 seconds (ref 11.4–15.2)

## 2023-06-14 LAB — CBC WITH DIFFERENTIAL/PLATELET
Abs Immature Granulocytes: 0.04 10*3/uL (ref 0.00–0.07)
Basophils Absolute: 0 10*3/uL (ref 0.0–0.1)
Basophils Relative: 0 %
Eosinophils Absolute: 0 10*3/uL (ref 0.0–0.5)
Eosinophils Relative: 0 %
HCT: 48.5 % (ref 39.0–52.0)
Hemoglobin: 16.6 g/dL (ref 13.0–17.0)
Immature Granulocytes: 1 %
Lymphocytes Relative: 9 %
Lymphs Abs: 0.8 10*3/uL (ref 0.7–4.0)
MCH: 30.7 pg (ref 26.0–34.0)
MCHC: 34.2 g/dL (ref 30.0–36.0)
MCV: 89.6 fL (ref 80.0–100.0)
Monocytes Absolute: 0.9 10*3/uL (ref 0.1–1.0)
Monocytes Relative: 10 %
Neutro Abs: 6.8 10*3/uL (ref 1.7–7.7)
Neutrophils Relative %: 80 %
Platelets: 215 10*3/uL (ref 150–400)
RBC: 5.41 MIL/uL (ref 4.22–5.81)
RDW: 15 % (ref 11.5–15.5)
WBC: 8.5 10*3/uL (ref 4.0–10.5)
nRBC: 0 % (ref 0.0–0.2)

## 2023-06-14 LAB — APTT: aPTT: 31 seconds (ref 24–36)

## 2023-06-14 MED ORDER — ACETAMINOPHEN 325 MG PO TABS
650.0000 mg | ORAL_TABLET | Freq: Once | ORAL | Status: AC
  Administered 2023-06-14: 650 mg via ORAL
  Filled 2023-06-14: qty 2

## 2023-06-14 MED ORDER — DIPHENHYDRAMINE HCL 50 MG/ML IJ SOLN
12.5000 mg | Freq: Once | INTRAMUSCULAR | Status: AC
  Administered 2023-06-14: 12.5 mg via INTRAVENOUS
  Filled 2023-06-14: qty 1

## 2023-06-14 MED ORDER — METOCLOPRAMIDE HCL 5 MG/ML IJ SOLN
10.0000 mg | Freq: Once | INTRAMUSCULAR | Status: AC
  Administered 2023-06-14: 10 mg via INTRAVENOUS
  Filled 2023-06-14: qty 2

## 2023-06-14 MED ORDER — HYDROCODONE-ACETAMINOPHEN 5-325 MG PO TABS
1.0000 | ORAL_TABLET | Freq: Once | ORAL | Status: AC
  Administered 2023-06-14: 1 via ORAL
  Filled 2023-06-14: qty 1

## 2023-06-14 NOTE — ED Notes (Signed)
Pt beginning to hallucinate.  PA notified.  Pt states headache increasing.

## 2023-06-14 NOTE — Consult Note (Incomplete)
NEURO HOSPITALIST CONSULT NOTE   Requesting physician: Dr. Earlene Plater  Reason for Consult: Worsened headache after recent discharge post-ICH  History obtained from:  Patient, Family and Chart     HPI:                                                                                                                                          Kyle [name] is a 77 y.o. male with a PMHx of atrial fibrillation, nephrolithiasis, migraines, rosacea, hyperthyroidism, hypertensive right basal ganglia ICH with admission for such on 9/19 followed by discharge home yesterday, who re-presents to the ED today with worsening headache and neck pain since 12 PM on Friday. The patient was alert and oriented with EMS. Vitals per EMS: BP 152/84, HR 64, RR 18, 95% on RA, CBG 138.  He states that his headache at home has been constant at about 4/10, with spikes to 8/10 induced by movement. The headache pain is centered along the vertex on the right side of his skull and is steady/nonthrobbing. He also has been having continued posterior neck muscle pain and stiffness, 3/10 at rest, spiking to severe 8/10 pain with movement. 1000 mg of PO Tylenol at home did not have any effect on his headache or neck pain, so family decided to have him reassessed in the ED.   He was experiencing some nausea on presentation and was administered a cocktail of Reglan, Benadryl and Tylenol, which resulted in somnolence. He was asleep for about 30 minutes and on awakening was briefly disoriented, making delusional comments such as being "in space" and needing to go home to attend to a matter that per family did not make any sense. This quickly resolved. Denies any visual hallucinations. He feels back to his cognitive baseline at the time of Neurology assessment.   Patient has been off blood thinners since 4 days ago.   CT head in the ED showed unchanged size of the right basal ganglia hemorrhage with a thin rim of vasogenic edema,  also unchanged.    Past Medical History:  Diagnosis Date   Atrial fibrillation (HCC)    History of kidney stones 10-23-2011  SPONTANEOUSLY PASSED   History of migraine    Hyperthyroidism NO MEDS OR TX   MONITORED BY DR SOUTH   Mild acid reflux OCCASIONAL-  WATCHES DIET   PONV (postoperative nausea and vomiting)    Rosacea     Past Surgical History:  Procedure Laterality Date   CARDIOVERSION  10-15-2007  DR Riley Kill   NEW ON-SET A-FIB   ELBOW SURGERY     right   ELBOW SURGERY     KNEE ARTHROSCOPY WITH LATERAL MENISECTOMY  08/07/2012   Procedure: KNEE ARTHROSCOPY WITH LATERAL MENISECTOMY;  Surgeon: Drucilla Schmidt, MD;  Location: [facility];  Service: Orthopedics;  Laterality: Left;  Left Knee Arthroscopy with Partial Lateral Menisectomy    KNEE SURGERY     RIGHT ELBOW SURGERY  1992 (APPROX)   TENDINITIS   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 He has never used smokeless tobacco. He reports current alcohol use of about 3.0 - 4.0 standard drinks of alcohol per week. He reports that he does not use drugs.  Allergies  Allergen Reactions   Ampicillin Rash    HOME MEDICATIONS:                                                                                                                     Tylenol PM Doxycyclie Methimazole Rosuvastatin Lasix   ROS:                                                                                                                                       Denies any new weakness. Other ROS as per HPI.    Blood pressure (!) 146/83, pulse 70, temperature 98.5 F (36.9 C), resp. rate 16, height 6\' 1"  (1.854 m), weight 92.5 kg, SpO2 96%.   General Examination:                                                                                                        Physical Exam  HEENT-  [place]/AT. Increased tone of neck muscles when passively flexing and rotating the neck, in conjunction with increased neck pain that is elicited by this maneuver. No improvement of neck pain with light massage.  Lungs- Respirations unlabored Extremities- No edema  Neurological Examination Mental Status: Alert, oriented  x 5, thought content appropriate.  Speech fluent without evidence of aphasia.  Able to follow all commands without difficulty.  Cranial Nerves: II: Temporal visual fields intact with no extinction to DSS. PERRL  III,IV, VI: No ptosis. EOMI. Saccadic quality to visual pursuits is noted.  V: Temp sensation decreased on the RIGHT  VII: Smile symmetric VIII: Hearing intact to voice IX,X: No hoarseness XI: Symmetric shoulder shrug XII: Midline tongue extension Motor: BUE 5/5 proximally and distally BLE 5/5 proximally and distally  No pronator drift.  Sensory: Temp sensation decreased to RIGHT upper extremity. FT intact x 4. No extinction to DSS.  Deep Tendon Reflexes: 2+ and symmetric throughout, except for trace left brachioradialis and biceps Cerebellar: No ataxia with FNF bilaterally  Gait: Deferred    Lab Results: Basic Metabolic Panel: Recent Labs  Lab 06/11/23 1109 06/12/23 1038 06/13/23 0719 06/14/23 1447  NA 140 135 134* 134*  K 4.4 4.0 4.3 4.3  CL 106 102 100 100  CO2 27 23 21* 24  GLUCOSE 92 154* 105* 140*  BUN 15 14 14 16   CREATININE 1.15 1.03 0.96 1.04  CALCIUM 8.6* 8.5* 8.7* 8.5*    CBC: Recent Labs  Lab 06/11/23 1109 06/12/23 1038 06/13/23 0719 06/14/23 1447  WBC 9.0 8.2 7.5 8.5  NEUTROABS  --   --   --  6.8  HGB 15.1 15.0 16.7 16.6  HCT 43.6 43.7 49.8 48.5  MCV 91.6 92.2 91.4 89.6  PLT 177 165 144* 215    Cardiac Enzymes: No results for input(s): "CKTOTAL", "CKMB", "CKMBINDEX", "TROPONINI" in the last 168 hours.  Lipid Panel: Recent Labs  Lab  06/12/23 1038  CHOL 101  TRIG 42  HDL 32*  CHOLHDL 3.2  VLDL 8  LDLCALC 61    Imaging: CT Head Wo Contrast  Result Date: 06/14/2023 CLINICAL DATA:  Intracranial hemorrhage EXAM: CT HEAD WITHOUT CONTRAST TECHNIQUE: Contiguous axial images were obtained from the base of the skull through the vertex without intravenous contrast. RADIATION DOSE REDUCTION: This exam was performed according to the departmental dose-optimization program which includes automated exposure control, adjustment of the mA and/or kV according to patient size and/or use of iterative reconstruction technique. COMPARISON:  CT head 2 days prior FINDINGS: Brain: The 4.4 cm x 2.8 cm x 2.9 cm intraparenchymal hematoma centered in the right basal ganglia/frontal lobe is not significantly changed in size compared to the study from 06/11/2023. There is mild surrounding edema which is similar to the prior study resulting in partial effacement of the adjacent frontal horn and mild subfalcine herniation. Intraventricular hemorrhage seen on the prior brain MRI is not appreciated on the current study. There is no new acute intracranial hemorrhage or extra-axial fluid collection there is no acute territorial infarct. Background parenchymal volume is normal. The ventricles are otherwise normal in size. The pituitary and suprasellar region are normal. There is no solid mass lesion. Vascular: There is calcification of the bilateral carotid siphons. Skull: Normal. Negative for fracture or focal lesion. Sinuses/Orbits: A small amount of fluid is noted in the right sphenoid sinus. The globes and orbits are unremarkable. Other: The mastoid air cells and middle ear cavities are clear. IMPRESSION: 1. Stable size of the intraparenchymal hematoma in the right basal ganglia/frontal lobe since the study from 06/11/2023, with similar regional mass effect resulting in mild subfalcine herniation. 2. No new acute intracranial pathology. Electronically Signed   By:  Lesia Hausen M.D.   On: 06/14/2023 15:50     Assessment:  77 y.o. male with a PMHx of atrial fibrillation, nephrolithiasis, migraines, rosacea, hyperthyroidism, hypertensive right basal ganglia ICH with admission for such on 9/19 followed by discharge home yesterday, who re-presents to the ED today with worsening headache and neck pain since 12 PM on Friday.  - Exam reveals no facial droop or significant left sided weakness, which is surprising given the large size of his subacute right basal ganglia hemorrhage. Also surprising is subjective mild sensory deficit for temperature sensation on the RIGHT (would expect a left sided deficit). He does have hypoactive left upper extremity reflexes, which is consistent with his right sided ICH.  - CT head: Stable size of the intraparenchymal hematoma in the right basal ganglia/frontal lobe since the study from 06/11/2023, with similar regional mass effect resulting in mild subfalcine herniation. No new acute intracranial pathology. - Impression: Uncontrolled headache pain and posterior neck painful muscle spasm secondary to recent ICH. Given his history of migraines, intractable migraine headache triggered by stress and the residual headache from his ICH is also on the DDx. Overall pattern of symptoms and exam findings is not consistent with a meningitis.    Recommendations: - Vicodin has been administered. Will reassess pain level.  - If Vicodin does not provide significant relief, would try Compazine 10 mg IV x 1 - If Compazine without significant relief, would administer 12.5 mcg IV fentanyl and reassess - If fentanyl ineffective, would administer 2 mg IV Ativan for its muscle-relaxing effect and assess for possible improvement in neck pain which may also result in improved headache pain.  - Discussed with EDP.   Electronically signed: Dr. Caryl Pina 06/14/2023, 7:28 PM

## 2023-06-14 NOTE — ED Notes (Signed)
Patient transported to CT 

## 2023-06-14 NOTE — ED Provider Notes (Signed)
Accepted handoff at shift change from [facility]. Please see prior provider note for full HPI.  Briefly: Patient is a 77 y.o. male who presents to the ER for headache after intracerebral hemorrhage on eliquis, discharged from hospital yesterday. Worsening headache since discharge.   DDX/Plan: Follow up with neurology consultation  2145 -- Discussed with neurologist Dr Otelia Limes. He feels patient's transient hallucinations were in the setting of reglan use. He has discontinued this and given patient vicodin for headache and neck pain, which patient currently rates at 8/10. Plan to reassess in 1 hr. If no improvement, will try compazine. If no improvement after that, he recommends 12.5 mcg fentanyl IV. Plan to send home with whichever medication controls symptoms the best. Could also consider low dose benzodiazepine or methocarbamol for neck pain.   2300 -- Pt states headache is down from 8.5 to 5/10. He requested one more tablet of the same medication he was given previously to see if headache will go away entirely. Given 1 more tablet of Norco 5-325. Plan to reassess in about an hour and notify Dr Otelia Limes.   Patient discussed and care transferred to [facility] at shift change. Please see his/her note for further details regarding further ED course and disposition. Plan at time of handoff is follow up on patient's headache after giving second tablet of Norco and hopefully dc to home.     Jeanella Flattery 06/14/23 2326    Laurence Spates, MD 06/15/23 1140

## 2023-06-14 NOTE — ED Triage Notes (Signed)
Pt was discharged from neuro icu here yesturday increasing headache . Known to have a intercerebral hemorraghic. Pt is negative for ems stroke scale. Pt is alert and oriented for ems  Bp 152/84 Hr 64  Rr 18  95% on ra Cbg 138

## 2023-06-14 NOTE — ED Notes (Signed)
Dr. Otelia Limes at bedside.

## 2023-06-14 NOTE — ED Provider Triage Note (Signed)
Emergency Medicine Provider Triage Evaluation Note  Kyle [name] , a 77 y.o. male  was evaluated in triage.  Pt complains of headache.  Review of Systems  Positive:  Negative:   Physical Exam  Ht 6\' 1"  (1.854 m)   Wt 92.5 kg   BMI 26.90 kg/m  Gen:   Awake, no distress   Resp:  Normal effort  MSK:   Moves extremities without difficulty  Other:    Medical Decision Making  Medically screening exam initiated at 2:41 PM.  Appropriate orders placed.  Kyle [name] was informed that the remainder of the evaluation will be completed by another provider, this initial triage assessment does not replace that evaluation, and the importance of remaining in the ED until their evaluation is complete.  Patient just discharged from NICU yesterday for a brain bleed. Patient with increasing headache since yesterday at noon. Patient ambulatory with EMS. Patient was taken off of Eliquis per EMS.   Neuro exam unremarkable.   Kyle [name], [place] 06/14/23 1446

## 2023-06-14 NOTE — ED Notes (Addendum)
Family sitting with pt

## 2023-06-14 NOTE — Discharge Instructions (Addendum)
It was a pleasure caring for you today.  As discussed, you will need to follow-up with your neurologist in the next couple days.  Seek emergency care if experiencing any new or worsening symptoms.  I have sent a prescription of medication you can continue using for your headache and neck pain.

## 2023-06-14 NOTE — ED Provider Notes (Signed)
[place] EMERGENCY DEPARTMENT AT [facility] Provider Note   CSN: [id number] Arrival date & time: 06/14/23  1433     History  Chief Complaint  Patient presents with   Headache    Kyle [name] is a 77 y.o. male with PMHx migraine, hyperthyroidism, afib who presents to ED concerned for worsening headache since 12pm yesterday. Patient had an acute brain bleed 4 days ago and was admitted to the hospital and discharged yesterday. Patient's family concerned because patient has been weaker than usual. Patient has been off blood thinners since 4 days ago. Denying any other symptoms today.   Headache      Home Medications Prior to Admission medications   Medication Sig Start Date End Date Taking? Authorizing Provider  doxycycline (PERIOSTAT) 20 MG tablet Take 20 mg by mouth 2 (two) times daily. 05/23/23   [provider]  furosemide (LASIX) 20 MG tablet TAKE 1 TABLET (20 MG TOTAL) BY MOUTH AS NEEDED (LOWER LEG SWELLING). Patient not taking: Reported on 06/11/2023 05/06/22   Lewayne Bunting, MD  methimazole (TAPAZOLE) 10 MG tablet Take 10 mg by mouth daily. 04/11/23   [provider]  rosuvastatin (CRESTOR) 20 MG tablet TAKE 1 TABLET DAILY. SCHEDULE OFFICE VISIT FOR FUTURE REFILLS 01/09/23   Lewayne Bunting, MD      Allergies    Ampicillin    Review of Systems   Review of Systems  Neurological:  Positive for headaches.    Physical Exam Updated Vital Signs BP (!) 146/83 (BP Location: Left Arm)   Pulse 70   Temp 98.5 F (36.9 C)   Resp 16   Ht 6\' 1"  (1.854 m)   Wt 92.5 kg   SpO2 96%   BMI 26.90 kg/m  Physical Exam Vitals and nursing note reviewed.  Constitutional:      General: He is not in acute distress.    Appearance: He is not ill-appearing or toxic-appearing.  HENT:     Head: Normocephalic and atraumatic.     Mouth/Throat:     Mouth: Mucous membranes are moist.     Pharynx: No oropharyngeal exudate or posterior oropharyngeal  erythema.  Eyes:     General: No scleral icterus.       Right eye: No discharge.        Left eye: No discharge.     Conjunctiva/sclera: Conjunctivae normal.  Cardiovascular:     Rate and Rhythm: Normal rate and regular rhythm.     Pulses: Normal pulses.     Heart sounds: No murmur heard. Pulmonary:     Effort: Pulmonary effort is normal.  Musculoskeletal:     Right lower leg: No edema.     Left lower leg: No edema.  Skin:    General: Skin is warm and dry.     Findings: No rash.  Neurological:     General: No focal deficit present.     Mental Status: He is alert. Mental status is at baseline.     GCS: GCS eye subscore is 4. GCS verbal subscore is 5. GCS motor subscore is 6.     Comments: GCS 15. Speech is goal oriented. No deficits appreciated to CN III-XII; symmetric eyebrow raise, no facial drooping, tongue midline. Patient has equal grip strength bilaterally with 5/5 strength against resistance in all major muscle groups bilaterally. Sensation to light touch intact. Patient moves extremities without ataxia. Patient ambulatory with steady gait.   Psychiatric:  Mood and Affect: Mood normal.        Behavior: Behavior normal.     ED Results / Procedures / Treatments   Labs (all labs ordered are listed, but only abnormal results are displayed) Labs Reviewed  COMPREHENSIVE METABOLIC PANEL - Abnormal; Notable for the following components:      Result Value   Sodium 134 (*)    Glucose, Bld 140 (*)    Calcium 8.5 (*)    Total Bilirubin 1.9 (*)    All other components within normal limits  APTT  PROTIME-INR  CBC WITH DIFFERENTIAL/PLATELET    EKG None  Radiology CT Head Wo Contrast  Result Date: 06/14/2023 CLINICAL DATA:  Intracranial hemorrhage EXAM: CT HEAD WITHOUT CONTRAST TECHNIQUE: Contiguous axial images were obtained from the base of the skull through the vertex without intravenous contrast. RADIATION DOSE REDUCTION: This exam was performed according to the  departmental dose-optimization program which includes automated exposure control, adjustment of the mA and/or kV according to patient size and/or use of iterative reconstruction technique. COMPARISON:  CT head 2 days prior FINDINGS: Brain: The 4.4 cm x 2.8 cm x 2.9 cm intraparenchymal hematoma centered in the right basal ganglia/frontal lobe is not significantly changed in size compared to the study from 06/11/2023. There is mild surrounding edema which is similar to the prior study resulting in partial effacement of the adjacent frontal horn and mild subfalcine herniation. Intraventricular hemorrhage seen on the prior brain MRI is not appreciated on the current study. There is no new acute intracranial hemorrhage or extra-axial fluid collection there is no acute territorial infarct. Background parenchymal volume is normal. The ventricles are otherwise normal in size. The pituitary and suprasellar region are normal. There is no solid mass lesion. Vascular: There is calcification of the bilateral carotid siphons. Skull: Normal. Negative for fracture or focal lesion. Sinuses/Orbits: A small amount of fluid is noted in the right sphenoid sinus. The globes and orbits are unremarkable. Other: The mastoid air cells and middle ear cavities are clear. IMPRESSION: 1. Stable size of the intraparenchymal hematoma in the right basal ganglia/frontal lobe since the study from 06/11/2023, with similar regional mass effect resulting in mild subfalcine herniation. 2. No new acute intracranial pathology. Electronically Signed   By: Lesia Hausen M.D.   On: 06/14/2023 15:50    Procedures Procedures    Medications Ordered in ED Medications  metoCLOPramide (REGLAN) injection 10 mg (10 mg Intravenous Given 06/14/23 1642)  acetaminophen (TYLENOL) tablet 650 mg (650 mg Oral Given 06/14/23 1642)  diphenhydrAMINE (BENADRYL) injection 12.5 mg (12.5 mg Intravenous Given 06/14/23 1641)    ED Course/ Medical Decision Making/ A&P                                  Medical Decision Making Amount and/or Complexity of Data Reviewed Labs: ordered. Radiology: ordered.  Risk OTC drugs. Prescription drug management.   This patient presents to the ED for concern of headache, this involves an extensive number of treatment options, and is a complaint that carries with it a high risk of complications and morbidity.  The differential diagnosis includes migraine, tension headache, cluster headache, subarachnoid hemorrhage, meningitis/encephalitis, idiopathic intercranial hypertension, ischemic stroke, ICH, cervical artery dissection   Co morbidities that complicate the patient evaluation  migraine, hyperthyroidism, afib, intracerebral hemorrhage   Additional history obtained:  Patient discharged from hospital yesterday for intracranial hemorrhage   Lab Tests:  I Ordered,  and personally interpreted labs.  The pertinent results include:   - CMP: Mild hyponatremia at 134. - PTINR/APTT: within normal limits - CBC: No concern for anemia or leukocytosis    Imaging Studies ordered:  I ordered imaging studies including  -CT head: to assess for process contributing to patient's symptoms  I independently visualized and interpreted imaging I agree with the radiologist interpretation   Cardiac Monitoring: / EKG:  The patient was maintained on a cardiac monitor.  I personally viewed and interpreted the cardiac monitored which showed an underlying rhythm of: afib which is baseline for patient   Consultations Obtained:  I requested consultation with Dr. Amada Jupiter (Neurology),  and discussed lab and imaging findings as well as pertinent plan - they recommend: tylenol and regland for headache. Maybe narcotic use if absolutely necessary.   Problem List / ED Course / Critical interventions / Medication management  Patient presents to ED with increasing headache. Physical and neuro exam unremarkable. Patient afebrile with  stable vitals. CT showing same size of intracranial bleed from last imaging.  CMP with mild hyponatremia at 134.  PT/INR and APTT within normal limits.  CBC without leukocytosis or anemia. Consulted with Dr. Amada Jupiter with neurology who recommends treating patient's headache with Reglan and Tylenol.  Also recommending narcotics if absolutely necessary.  Provided patient with Reglan and Tylenol without relief.  Patient family now [place] stating that they are concerned that patient has been increasingly weak over the past day and maybe now seems to be having hallucinations.  Nurse stating that patient said that he was "in space".  Consulted with neurology again.  Neurologist agrees to see patient in the ED and will recommend plan afterwards. I have reviewed the patients home medicines and have made adjustments as needed    Social Determinants of Health:  None 8:43 PM Care of AHKING PRETZER transferred to PA [place]in at the end of my shift as the patient will require reassessment once labs/imaging have resulted. Patient presentation, ED course, and plan of care discussed with review of all pertinent labs and imaging. Please see his/her note for further details regarding further ED course and disposition. Plan at time of handoff is reassess patient after Neuro consult. This may be altered or completely changed at the discretion of the oncoming team pending results of further workup.            Final Clinical Impression(s) / ED Diagnoses Final diagnoses:  None    Rx / DC Orders ED Discharge Orders     None         Margarita Rana 06/14/23 2049    Laurence Spates, MD 06/15/23 1140

## 2023-06-15 DIAGNOSIS — R519 Headache, unspecified: Secondary | ICD-10-CM | POA: Diagnosis not present

## 2023-06-15 MED ORDER — PROCHLORPERAZINE MALEATE 10 MG PO TABS: 10.0000 mg | ORAL_TABLET | Freq: Two times a day (BID) | ORAL | 0 refills | Status: DC | PRN

## 2023-06-15 MED ORDER — OXYCODONE-ACETAMINOPHEN 5-325 MG PO TABS
2.0000 | ORAL_TABLET | Freq: Four times a day (QID) | ORAL | 0 refills | Status: DC | PRN
Start: 2023-06-15 — End: 2023-07-07

## 2023-06-15 MED ORDER — PROCHLORPERAZINE EDISYLATE 10 MG/2ML IJ SOLN
10.0000 mg | Freq: Once | INTRAMUSCULAR | Status: DC
  Filled 2023-06-15: qty 2

## 2023-06-15 MED ORDER — PROCHLORPERAZINE EDISYLATE 10 MG/2ML IJ SOLN
10.0000 mg | Freq: Once | INTRAMUSCULAR | Status: AC
  Administered 2023-06-15: 10 mg via INTRAVENOUS
  Filled 2023-06-15: qty 2

## 2023-06-15 MED ORDER — PROCHLORPERAZINE MALEATE 5 MG PO TABS
10.0000 mg | ORAL_TABLET | Freq: Once | ORAL | Status: AC
  Administered 2023-06-15: 10 mg via ORAL
  Filled 2023-06-15: qty 2

## 2023-06-15 NOTE — ED Provider Notes (Signed)
1:02 AM Pain remains at 5/10 after 2nd tablet of Norco. This remains improved from 8.5/10 on arrival. Per prior recommendations, will escalate to PO Compazine.   1:16 AM Secure chat sent to MD Otelia Limes of Neurology to update on patient's current status and pain scale.  2:26 AM Headache down to 4/10. Given additional dose of IV Compazine, per Neurology recommendations. Additional IV dose cleared via Pharmacy. Normal QTc on arrival.  3:30 AM Additional compazine just given. Delay related to arrival of another trauma patient.  4:08 AM Patient's pain improved to 3/10 with additional IV Compazine.  5:57 AM Had a very lengthy discussion with the patient's wife about outpatient pain control plan.  Will discharge with Compazine as well as Percocet for headache management.  Have recommended use of both of these medications over the next 24 to 48 hours with an attempt to titrate down the Percocet dosing after this time to try and reduce risks of dependence.  Stressed the importance of close follow-up with neurology to maintain outpatient symptom control.  Return precautions discussed and provided. Patient discharged in stable condition with no unaddressed concerns.   Antony Madura, PA-C 06/15/23 4742    Gilda Crease, MD 06/15/23 [phone]

## 2023-06-18 ENCOUNTER — Emergency Department (HOSPITAL_COMMUNITY)
Admission: EM | Admit: 2023-06-18 | Discharge: 2023-06-18 | Disposition: A | Discharge status: Home / Self Care | Payer: Medicare Other | Attending: Emergency Medicine | Admitting: Emergency Medicine

## 2023-06-18 ENCOUNTER — Emergency Department (HOSPITAL_COMMUNITY): Payer: Medicare Other

## 2023-06-18 ENCOUNTER — Other Ambulatory Visit: Payer: Self-pay

## 2023-06-18 ENCOUNTER — Telehealth: Payer: Self-pay

## 2023-06-18 DIAGNOSIS — Z7901 Long term (current) use of anticoagulants: Secondary | ICD-10-CM | POA: Diagnosis not present

## 2023-06-18 DIAGNOSIS — W1830XA Fall on same level, unspecified, initial encounter: Secondary | ICD-10-CM | POA: Diagnosis not present

## 2023-06-18 DIAGNOSIS — I4891 Unspecified atrial fibrillation: Secondary | ICD-10-CM | POA: Insufficient documentation

## 2023-06-18 DIAGNOSIS — W19XXXA Unspecified fall, initial encounter: Secondary | ICD-10-CM

## 2023-06-18 DIAGNOSIS — M47812 Spondylosis without myelopathy or radiculopathy, cervical region: Secondary | ICD-10-CM | POA: Diagnosis not present

## 2023-06-18 DIAGNOSIS — R519 Headache, unspecified: Secondary | ICD-10-CM | POA: Diagnosis present

## 2023-06-18 DIAGNOSIS — I672 Cerebral atherosclerosis: Secondary | ICD-10-CM | POA: Diagnosis not present

## 2023-06-18 LAB — CBC
HCT: 50.5 % (ref 39.0–52.0)
Hemoglobin: 17.1 g/dL — ABNORMAL HIGH (ref 13.0–17.0)
MCH: 30.6 pg (ref 26.0–34.0)
MCHC: 33.9 g/dL (ref 30.0–36.0)
MCV: 90.3 fL (ref 80.0–100.0)
Platelets: 254 10*3/uL (ref 150–400)
RBC: 5.59 MIL/uL (ref 4.22–5.81)
RDW: 14.7 % (ref 11.5–15.5)
WBC: 8.7 10*3/uL (ref 4.0–10.5)
nRBC: 0 % (ref 0.0–0.2)

## 2023-06-18 LAB — URINALYSIS, ROUTINE W REFLEX MICROSCOPIC
Bacteria, UA: NONE SEEN
Bilirubin Urine: NEGATIVE
Glucose, UA: NEGATIVE mg/dL
Hgb urine dipstick: NEGATIVE
Ketones, ur: NEGATIVE mg/dL
Leukocytes,Ua: NEGATIVE
Nitrite: NEGATIVE
Protein, ur: 30 mg/dL — AB
Specific Gravity, Urine: 1.02 (ref 1.005–1.030)
pH: 6 (ref 5.0–8.0)

## 2023-06-18 LAB — PROTIME-INR
INR: 1.1 (ref 0.8–1.2)
Prothrombin Time: 14.4 seconds (ref 11.4–15.2)

## 2023-06-18 LAB — COMPREHENSIVE METABOLIC PANEL
ALT: 21 U/L (ref 0–44)
AST: 20 U/L (ref 15–41)
Albumin: 3.8 g/dL (ref 3.5–5.0)
Alkaline Phosphatase: 67 U/L (ref 38–126)
Anion gap: 10 (ref 5–15)
BUN: 19 mg/dL (ref 8–23)
CO2: 25 mmol/L (ref 22–32)
Calcium: 9 mg/dL (ref 8.9–10.3)
Chloride: 98 mmol/L (ref 98–111)
Creatinine, Ser: 1.08 mg/dL (ref 0.61–1.24)
GFR, Estimated: >60 mL/min (ref >60)
Glucose, Bld: 144 mg/dL — ABNORMAL HIGH (ref 70–99)
Potassium: 4.2 mmol/L (ref 3.5–5.1)
Sodium: 133 mmol/L — ABNORMAL LOW (ref 135–145)
Total Bilirubin: 1.3 mg/dL — ABNORMAL HIGH (ref 0.3–1.2)
Total Protein: 6.7 g/dL (ref 6.5–8.1)

## 2023-06-18 LAB — ETHANOL: Alcohol, Ethyl (B): <10 mg/dL (ref <10)

## 2023-06-18 MED ORDER — PREGABALIN 50 MG PO CAPS: 50.0000 mg | ORAL_CAPSULE | Freq: Two times a day (BID) | ORAL | 0 refills | Status: DC

## 2023-06-18 MED ORDER — PREGABALIN 25 MG PO CAPS
50.0000 mg | ORAL_CAPSULE | Freq: Once | ORAL | Status: AC
  Administered 2023-06-18: 50 mg via ORAL
  Filled 2023-06-18: qty 2

## 2023-06-18 NOTE — ED Triage Notes (Signed)
Pt from home, fall in the bathroom, no LOC. Hx of head bleed 4 days ago. No c.o neck pain initially. C collar in place. Pt c.o generalized weakness in his legs. VSS

## 2023-06-18 NOTE — ED Notes (Signed)
Bed alarm and fall risk bracelet placed

## 2023-06-18 NOTE — ED Provider Notes (Signed)
[place] EMERGENCY DEPARTMENT AT [facility] Provider Note   CSN: [id number] Arrival date & time: 06/18/23  1601     History  Chief Complaint  Patient presents with   Kyle [name] is a 77 y.o. male.  HPI Patient presents via EMS after a fall.  Patient is a level 2 trauma. History is notable for frequent falls including one 2 weeks ago following which the patient was diagnosed with intracranial hemorrhage.  Following that event the patient stopped his anticoagulation.  He now presents after a chemical fall which he remembers, no loss of consciousness.  EMS reports patient was awake and alert, vital signs unremarkable in transport.  Patient notes that he fell, struck his head, left face.  No weakness in any extremity.    Home Medications Prior to Admission medications   Medication Sig Start Date End Date Taking? Authorizing Provider  pregabalin (LYRICA) 50 MG capsule Take 1 capsule (50 mg total) by mouth 2 (two) times daily. 06/18/23  Yes Gerhard Munch, MD  apixaban (ELIQUIS) 5 MG TABS tablet Take 5 mg by mouth 2 (two) times daily. Patient not taking: Reported on 06/14/2023    [provider]  doxycycline (PERIOSTAT) 20 MG tablet Take 20 mg by mouth 2 (two) times daily. 05/23/23   [provider]  furosemide (LASIX) 20 MG tablet TAKE 1 TABLET (20 MG TOTAL) BY MOUTH AS NEEDED (LOWER LEG SWELLING). Patient not taking: Reported on 06/11/2023 05/06/22   Lewayne Bunting, MD  methimazole (TAPAZOLE) 10 MG tablet Take 10 mg by mouth every evening. 04/11/23   [provider]  oxyCODONE-acetaminophen (PERCOCET/ROXICET) 5-325 MG tablet Take 2 tablets by mouth every 6 (six) hours as needed for severe pain. 06/15/23   Antony Madura, PA-C  prochlorperazine (COMPAZINE) 10 MG tablet Take 1 tablet (10 mg total) by mouth 2 (two) times daily as needed (nausea, vomiting, headache). 06/15/23   Antony Madura, PA-C  rosuvastatin (CRESTOR) 20 MG tablet TAKE 1 TABLET  DAILY. SCHEDULE OFFICE VISIT FOR FUTURE REFILLS 01/09/23   Lewayne Bunting, MD      Allergies    Ampicillin    Review of Systems   Review of Systems  Unable to perform ROS: Acuity of condition    Physical Exam Updated Vital Signs BP (!) 143/78   Pulse 62   Temp 98.6 F (37 C) (Oral)   Resp 16   Ht 6\' 1"  (1.854 m)   Wt 81.6 kg   SpO2 99%   BMI 23.75 kg/m  Physical Exam Vitals and nursing note reviewed.  Constitutional:      General: He is not in acute distress.    Appearance: He is well-developed.  HENT:     Head: Normocephalic.   Eyes:     Conjunctiva/sclera: Conjunctivae normal.  Neck:   Cardiovascular:     Rate and Rhythm: Normal rate and regular rhythm.  Pulmonary:     Effort: Pulmonary effort is normal. No respiratory distress.     Breath sounds: No stridor.  Abdominal:     General: There is no distension.    Musculoskeletal:     Comments: Pelvis stable, no deformities in the upper or lower extremities, patient moves all extremity spontaneously to command.  Skin:    General: Skin is warm and dry.  Neurological:     Mental Status: He is alert and oriented to person, place, and time.     Comments: No focal abnormality speech is clear,  face is symmetric     ED Results / Procedures / Treatments   Labs (all labs ordered are listed, but only abnormal results are displayed) Labs Reviewed  COMPREHENSIVE METABOLIC PANEL - Abnormal; Notable for the following components:      Result Value   Sodium 133 (*)    Glucose, Bld 144 (*)    Total Bilirubin 1.3 (*)    All other components within normal limits  CBC - Abnormal; Notable for the following components:   Hemoglobin 17.1 (*)    All other components within normal limits  URINALYSIS, ROUTINE W REFLEX MICROSCOPIC - Abnormal; Notable for the following components:   Protein, ur 30 (*)    All other components within normal limits  ETHANOL  PROTIME-INR    EKG EKG Interpretation Date/Time:  Wednesday  June 18 2023 09:44:00 EDT Ventricular Rate:  68 PR Interval:    QRS Duration:  86 QT Interval:  376 QTC Calculation: 400 R Axis:   -18  Text Interpretation: Atrial fibrillation Borderline left axis deviation Borderline T abnormalities, inferior leads Confirmed by Gerhard Munch [phone]) on 06/18/2023 10:06:25 AM  Radiology CT CERVICAL SPINE WO CONTRAST  Result Date: 06/18/2023 CLINICAL DATA:  Intraparenchymal hemorrhage. Fall with trauma to head and neck. EXAM: CT CERVICAL SPINE WITHOUT CONTRAST TECHNIQUE: Multidetector CT imaging of the cervical spine was performed without intravenous contrast. Multiplanar CT image reconstructions were also generated. RADIATION DOSE REDUCTION: This exam was performed according to the departmental dose-optimization program which includes automated exposure control, adjustment of the mA and/or kV according to patient size and/or use of iterative reconstruction technique. COMPARISON:  None Available. FINDINGS: Alignment: No malalignment. Skull base and vertebrae: No regional fracture or focal bone lesion. Soft tissues and spinal canal: No traumatic soft tissue finding. Disc levels: No significant degenerative change. Mild uncovertebral osteophytes at C4-5 and C5-6. Mild facet osteoarthritis at C5-6 and C7-T1. No apparent compressive stenosis. Upper chest: Negative Other: None IMPRESSION: No acute or traumatic finding. Mild degenerative changes as outlined above. Electronically Signed   By: Paulina Fusi M.D.   On: 06/18/2023 10:19   CT MAXILLOFACIAL WO CONTRAST  Result Date: 06/18/2023 CLINICAL DATA:  Fall with trauma to the head and face EXAM: CT MAXILLOFACIAL WITHOUT CONTRAST TECHNIQUE: Multidetector CT imaging of the maxillofacial structures was performed. Multiplanar CT image reconstructions were also generated. RADIATION DOSE REDUCTION: This exam was performed according to the departmental dose-optimization program which includes automated exposure control,  adjustment of the mA and/or kV according to patient size and/or use of iterative reconstruction technique. COMPARISON:  None Available. FINDINGS: Osseous: No facial fracture. Orbits: No orbital injury. Sinuses: No traumatic sinus finding. Soft tissues: No pronounced soft tissue swelling or hematoma visible by CT. Limited intracranial: See results of head CT. Known right intraparenchymal hemorrhage. IMPRESSION: 1. No facial fracture. No traumatic sinus finding. 2. See results of head CT. Known right intraparenchymal hemorrhage. Electronically Signed   By: Paulina Fusi M.D.   On: 06/18/2023 10:18   CT HEAD WO CONTRAST  Result Date: 06/18/2023 CLINICAL DATA:  Head trauma, moderate to severe. Fell at home. History intracranial hemorrhage 4 days ago. EXAM: CT HEAD WITHOUT CONTRAST TECHNIQUE: Contiguous axial images were obtained from the base of the skull through the vertex without intravenous contrast. RADIATION DOSE REDUCTION: This exam was performed according to the departmental dose-optimization program which includes automated exposure control, adjustment of the mA and/or kV according to patient size and/or use of iterative reconstruction technique. COMPARISON:  Head CT  06/14/2023 FINDINGS: Brain: Expected evolutionary changes of the previously seen right inferior frontal and basal ganglia intraparenchymal hemorrhage, approximate measurements 4.5 x 2.4 x 1.7 cm. The hematoma is slightly less dense and less distinct. No progressive bleeding. Surrounding edema appears similar. Regional mass effect is similar. No new intracranial hemorrhage. Brain otherwise shows only minimal small vessel change of the white matter. Small amount of blood previously seen in the occipital horns of the lateral ventricles is no longer visible by CT. Ventricular size is stable. Vascular: There is atherosclerotic calcification of the major vessels at the base of the brain. Skull: Negative Sinuses/Orbits: Clear/normal Other: None  IMPRESSION: 1. Expected evolutionary changes of the previously seen right inferior frontal and basal ganglia intraparenchymal hemorrhage, approximate measurements 4.5 x 2.4 x 1.7 cm. The hematoma is slightly less dense and less distinct as expected. No progressive bleeding. Surrounding edema appears similar. Regional mass effect is similar. 2. Small amount of blood previously seen in the occipital horns of the lateral ventricles is no longer visible by CT. Ventricular size is stable. Electronically Signed   By: Paulina Fusi M.D.   On: 06/18/2023 10:17    Procedures Procedures    Medications Ordered in ED Medications  pregabalin (LYRICA) capsule 50 mg (has no administration in time range)    ED Course/ Medical Decision Making/ A&P                                 Medical Decision Making Elderly male presents after fall in the context of frequent falls including 1 resulting in intracranial hemorrhage 2 weeks ago.  Differential includes fracture versus hemorrhage versus electrolyte abnormalities. Case managed with our trauma team. Cardiac 75 afib, abnml Pulse ox 96% room air normal   Amount and/or Complexity of Data Reviewed Independent Historian: EMS External Data Reviewed: notes. Labs: ordered. Decision-making details documented in ED Course. Radiology: ordered and independent interpretation performed. Decision-making details documented in ED Course. ECG/medicine tests: ordered and independent interpretation performed. Decision-making details documented in ED Course.  Risk Prescription drug management. Decision regarding hospitalization. Diagnosis or treatment significantly limited by social determinants of health.   Update: Patient accompanied by his wife, daughter, on this and multiple other repeat exams. 2:41 PM After discussing patient's case with our neurology colleague, and considering options for intervention with his family, and discussing the case with our social work  colleagues, patient will be discharged.  Follow-up scheduled for next week with neurology and primary care.  PT OT home health aide home health orders have been placed. Patient did not meet criteria after recent admission for skilled nursing placement, and today no indication for admission.  However, with his ongoing headache, pain, in the context of an additional fall as above I discussed this case with social work, neurology, and adjustments to his medications were made in an effort to decrease his symptoms while he continues outpatient rehabilitation efforts.         Final Clinical Impression(s) / ED Diagnoses Final diagnoses:  Fall, initial encounter    Rx / DC Orders ED Discharge Orders          Ordered    pregabalin (LYRICA) 50 MG capsule  2 times daily        06/18/23 1439    Home Health        06/18/23 1441    Face-to-face encounter (required for Medicare/Medicaid patients)       Comments:  I Gerhard Munch certify that this patient is under my care and that I, or a nurse practitioner or physician's assistant working with me, had a face-to-face encounter that meets the physician face-to-face encounter requirements with this patient on 06/18/2023. The encounter with the patient was in whole, or in part for the following medical condition(s) which is the primary reason for home health care (List medical condition): additional orders per PMD   06/18/23 1441              Gerhard Munch, MD 06/18/23 1442

## 2023-06-18 NOTE — ED Notes (Signed)
Help get patient on a bed alarm patient has family at bedside and call bell in reach

## 2023-06-18 NOTE — Discharge Instructions (Addendum)
Today's evaluation has been generally reassuring.  However, with her ongoing headache, leg pain and weakness, it is important to continue to follow-up with your outpatient physicians as discussed.  In addition, you are trying a new medication for pain relief.  This should be taken twice daily.  Discussed this with your physicians on follow-up.  Return here for concerning changes in your condition.     [employer]  [facility] Address: [address] [place], [place] [address] Phone: [phone]  [facility] Address: [address], [place], [place] [address] Phone: [phone]  [facility] Address: [address] [place], [place] [address] Phone: [phone]  [facility] Address: [address] [place], [place] [address] Phone: [phone]  [facility] Address: [address] [place], [place], [place] [address] Phone: [phone]  [employer] Phone: [phone]  [facility] [employer] Address:  [address], [place], [place] [address] Phone:  [phone]  [facility] Address:  [address] Phone:  [phone]  [email or web address]  [employer] Phone: [phone]

## 2023-06-18 NOTE — ED Notes (Signed)
Trauma Response Nurse Documentation   Kyle [name] is a 77 y.o. male arriving to [facility] ED via EMS  On Xarelto (rivaroxaban) daily. But was taken off 2 days ago. Trauma was activated as a Level 2 by ED Charge RN based on the following trauma criteria Elderly patients > 65 with head trauma on anti-coagulation (excluding ASA).  Patient cleared for CT by Dr. Jeraldine Loots. Pt transported to CT with trauma response nurse present to monitor. RN remained with the patient throughout their absence from the department for clinical observation.   GCS 15.  History   Past Medical History:  Diagnosis Date   Atrial fibrillation (HCC)    History of kidney stones 10-23-2011  SPONTANEOUSLY PASSED   History of migraine    Hyperthyroidism NO MEDS OR TX   MONITORED BY DR SOUTH   Mild acid reflux OCCASIONAL-  WATCHES DIET   PONV (postoperative nausea and vomiting)    Rosacea      Past Surgical History:  Procedure Laterality Date   CARDIOVERSION  10-15-2007  DR Riley Kill   NEW ON-SET A-FIB   ELBOW SURGERY     right   ELBOW SURGERY     KNEE ARTHROSCOPY WITH LATERAL MENISECTOMY  08/07/2012   Procedure: KNEE ARTHROSCOPY WITH LATERAL MENISECTOMY;  Surgeon: Drucilla Schmidt, MD;  Location: [facility];  Service: Orthopedics;  Laterality: Left;  Left Knee Arthroscopy with Partial Lateral Menisectomy    KNEE SURGERY     RIGHT ELBOW SURGERY  1992 (APPROX)   TENDINITIS   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 C-collar in place VSS PERRLA  CT's Completed:   CT Head, CT Maxillofacial, and CT C-Spine   Interventions:  - 20G PIV to R upper  arm - Labs drawn - CT head, c-spine and max face. - 12 lead EKG  Plan for disposition:  Other Awaiting scan results  Consults completed:  none at 1015.  Event Summary: Pt normally takes xarelto for a-fib but has been falling a lot lately and had an ICH a few days ago.  Per EMS, pt stopped taking xarelto 2 days ago.  Today, pt had a fall, striking his forehead on the door in the bathroom. No LOC, c/o HA and generalized weakness in his legs.  Per pt, his wife made him come here to get checked out.   Bedside handoff with ED RN Kyle [name].    [name]  Trauma Response RN  Please call TRN at [phone] for further assistance.

## 2023-06-18 NOTE — Patient Outreach (Signed)
Emmi Stroke Care Coordination Follow Up  06/18/2023 Name:  REY KUCZEK MRN:  [id number] DOB:  1946-03-03  Subjective: SHAIKH ZOBELL is a 77 y.o. year old male who is a primary care patient of Adrian Prince, MD An Emmi alert was received on 06/17/23 indicating patient responded to questions: Feeling worse overall?Marland Kitchen   Upon chart review noted that patient currently in the ED for chief complaint of "fall."  Care Coordination Interventions:  No, not indicated   Follow up plan:  RN CM will monitor ED status/disposition and follow up with pt if he returns home.    Encounter Outcome:  No Answer    Antionette Fairy, RN,BSN,CCM [facility] Care Management Care Management Community Coordinator Direct Phone: [phone] Toll Free: [phone] Fax: [phone]

## 2023-06-18 NOTE — ED Notes (Addendum)
Assisted NT to get pt up to bedside to void. Pt seemed a little unclear on judgment regarding safety of getting up.  He was able to bear weight and handle the urinal himself once standing.  We remained at bedside until pt returned to bed.  Fall risk socks placed.

## 2023-06-18 NOTE — Progress Notes (Addendum)
CSW spoke with patients wife Kyle [name] and daughter Kyle [name] at bedside. CSW explained to patients family that patient will need a 3 midnight inpatient stay on the medical floor, [facility] wavier, or a recent 3 midnight admission stay within the last 30 days. CSW reviewed patients chart and patient doesn't qualify for the [facility] wavier and hasn't had a recent inpatient stay. Patient was recently admitted into the hospital on 06/11/23 but was discharged on 06/13/23. Patient needed another midnight stay from the previous admission to qualify for SNF.   Patients family is frustrated and stated they are having a hard time caring for patient at home. CSW offered patients wife home aide services but patients wife was not interested. Patient will need to continue with outpatient PT/OT or possibly switch to home health PT/OT. CSW also told patients family they could pay out of pocket for rehab. Family is aware that paying out of pocket would cost 8,000-10,000 for 30 days. Home aide resources will be added to patients AVS. TOC will be signing off at this time. Please re-consult if any other concerns arise.

## 2023-06-18 NOTE — Patient Outreach (Signed)
Received a red flag Emmi stroke notification. I have assigned Antionette Fairy, RN to call for follow up and determine if there are any Case Management needs.    Iverson Alamin, Donivan Scull [facility] Care Management Assistant [facility] Care Management [phone]

## 2023-06-19 ENCOUNTER — Telehealth: Payer: Self-pay

## 2023-06-19 NOTE — Patient Outreach (Signed)
Emmi Stroke Care Coordination Follow Up  06/19/2023 Name:  Kyle [name] MRN:  [id number] DOB:  1945-11-16  Subjective: [name] is a 77 y.o. year old male who is a primary care patient of Adrian Prince, MD An Emmi alert was on 06/17/23 received indicating patient responded to questions: Feeling worse overall?. I reached out by phone to follow up on the alert. No answer at present. RN CM left HIPAA compliant voicemail message along with contact info.   Care Coordination Interventions:  No, not indicated   Follow up plan:  RN CM will make outreach attempt to patient if no return call.    Encounter Outcome:  No Answer   Antionette Fairy, RN,BSN,CCM [facility] Care Management Care Management Community Coordinator Direct Phone: [phone] Toll Free: [phone] Fax: [phone]

## 2023-06-20 ENCOUNTER — Other Ambulatory Visit: Payer: Self-pay

## 2023-06-20 ENCOUNTER — Observation Stay (HOSPITAL_COMMUNITY)
Admission: EM | Admit: 2023-06-20 | Discharge: 2023-06-22 | Disposition: A | Discharge status: Home Health | Payer: Medicare Other | Attending: Internal Medicine | Admitting: Internal Medicine

## 2023-06-20 ENCOUNTER — Encounter (HOSPITAL_COMMUNITY): Payer: Self-pay

## 2023-06-20 ENCOUNTER — Telehealth: Payer: Self-pay

## 2023-06-20 ENCOUNTER — Emergency Department (HOSPITAL_COMMUNITY): Payer: Medicare Other

## 2023-06-20 ENCOUNTER — Inpatient Hospital Stay (HOSPITAL_COMMUNITY): Payer: Medicare Other

## 2023-06-20 DIAGNOSIS — Z79899 Other long term (current) drug therapy: Secondary | ICD-10-CM | POA: Insufficient documentation

## 2023-06-20 DIAGNOSIS — I619 Nontraumatic intracerebral hemorrhage, unspecified: Secondary | ICD-10-CM | POA: Diagnosis present

## 2023-06-20 DIAGNOSIS — E039 Hypothyroidism, unspecified: Secondary | ICD-10-CM | POA: Insufficient documentation

## 2023-06-20 DIAGNOSIS — R5381 Other malaise: Secondary | ICD-10-CM | POA: Diagnosis present

## 2023-06-20 DIAGNOSIS — K5730 Diverticulosis of large intestine without perforation or abscess without bleeding: Secondary | ICD-10-CM | POA: Insufficient documentation

## 2023-06-20 DIAGNOSIS — E875 Hyperkalemia: Secondary | ICD-10-CM | POA: Diagnosis not present

## 2023-06-20 DIAGNOSIS — R531 Weakness: Secondary | ICD-10-CM

## 2023-06-20 DIAGNOSIS — M5416 Radiculopathy, lumbar region: Secondary | ICD-10-CM | POA: Insufficient documentation

## 2023-06-20 DIAGNOSIS — I4891 Unspecified atrial fibrillation: Secondary | ICD-10-CM | POA: Insufficient documentation

## 2023-06-20 DIAGNOSIS — E785 Hyperlipidemia, unspecified: Secondary | ICD-10-CM | POA: Insufficient documentation

## 2023-06-20 DIAGNOSIS — R609 Edema, unspecified: Secondary | ICD-10-CM | POA: Diagnosis not present

## 2023-06-20 DIAGNOSIS — I10 Essential (primary) hypertension: Secondary | ICD-10-CM | POA: Insufficient documentation

## 2023-06-20 DIAGNOSIS — M79604 Pain in right leg: Principal | ICD-10-CM

## 2023-06-20 LAB — CBC
HCT: 49 % (ref 39.0–52.0)
Hemoglobin: 16.8 g/dL (ref 13.0–17.0)
MCH: 30.9 pg (ref 26.0–34.0)
MCHC: 34.3 g/dL (ref 30.0–36.0)
MCV: 90.1 fL (ref 80.0–100.0)
Platelets: 293 10*3/uL (ref 150–400)
RBC: 5.44 MIL/uL (ref 4.22–5.81)
RDW: 14.9 % (ref 11.5–15.5)
WBC: 9.2 10*3/uL (ref 4.0–10.5)
nRBC: 0 % (ref 0.0–0.2)

## 2023-06-20 LAB — TSH: TSH: 7.429 u[IU]/mL — ABNORMAL HIGH (ref 0.350–4.500)

## 2023-06-20 LAB — BASIC METABOLIC PANEL
Anion gap: 7 (ref 5–15)
BUN: 20 mg/dL (ref 8–23)
CO2: 26 mmol/L (ref 22–32)
Calcium: 8.6 mg/dL — ABNORMAL LOW (ref 8.9–10.3)
Chloride: 101 mmol/L (ref 98–111)
Creatinine, Ser: 1.1 mg/dL (ref 0.61–1.24)
GFR, Estimated: >60 mL/min (ref >60)
Glucose, Bld: 102 mg/dL — ABNORMAL HIGH (ref 70–99)
Potassium: 5.2 mmol/L — ABNORMAL HIGH (ref 3.5–5.1)
Sodium: 134 mmol/L — ABNORMAL LOW (ref 135–145)

## 2023-06-20 LAB — CK: Total CK: 77 U/L (ref 49–397)

## 2023-06-20 LAB — T4, FREE: Free T4: 0.64 ng/dL (ref 0.61–1.12)

## 2023-06-20 MED ORDER — DOXYCYCLINE HYCLATE 20 MG PO TABS: 20.0000 mg | ORAL_TABLET | Freq: Two times a day (BID) | ORAL | Status: DC

## 2023-06-20 MED ORDER — ROSUVASTATIN CALCIUM 20 MG PO TABS
20.0000 mg | ORAL_TABLET | Freq: Every day | ORAL | Status: DC
  Administered 2023-06-21 – 2023-06-22 (×2): 20 mg via ORAL
  Filled 2023-06-20 (×2): qty 1

## 2023-06-20 MED ORDER — PREGABALIN 25 MG PO CAPS: 50.0000 mg | ORAL_CAPSULE | Freq: Two times a day (BID) | ORAL | Status: DC

## 2023-06-20 MED ORDER — PREGABALIN 75 MG PO CAPS
75.0000 mg | ORAL_CAPSULE | Freq: Two times a day (BID) | ORAL | Status: DC
  Administered 2023-06-21 – 2023-06-22 (×3): 75 mg via ORAL
  Filled 2023-06-20 (×3): qty 1

## 2023-06-20 MED ORDER — METHIMAZOLE 10 MG PO TABS
10.0000 mg | ORAL_TABLET | Freq: Every evening | ORAL | Status: DC
  Administered 2023-06-21: 10 mg via ORAL
  Filled 2023-06-20 (×3): qty 1

## 2023-06-20 MED ORDER — ACETAMINOPHEN 500 MG PO TABS: 1000.0000 mg | ORAL_TABLET | Freq: Four times a day (QID) | ORAL | Status: DC | PRN

## 2023-06-20 NOTE — Patient Outreach (Signed)
  Emmi Stroke Care Coordination Follow Up  06/20/2023 Name:  GABRYEL FILES MRN:  [id number] DOB:  04/19/1946  Subjective: Kyle [name] is a 77 y.o. year old male who is a primary care patient of Adrian Prince, MD An Emmi alert was received on 06/17/23 indicating patient responded to questions: Feeling worse overall?. I reached out by phone to follow up on the alert and spoke to  wife . She voices that pt is 'not doing okay.' States "hospital discharged him after only being their two nights and he should have stayed another night so Medicare could cover facility placement for him."Wife became very upset during the call. States pt had to go to ED as he suffered a fall. RN CM tried to further assess and offer assistance but wife was not interested.  Care Coordination Interventions:  Yes, provided     Interventions Today    Flowsheet Row Most Recent Value  Chronic Disease   Chronic disease during today's visit Other  [post stroke mgmt]  General Interventions   General Interventions Discussed/Reviewed General Interventions Discussed  Safety Interventions   Safety Discussed/Reviewed Safety Discussed, Fall Risk, Home Safety      TOC Interventions Today    Flowsheet Row Most Recent Value  TOC Interventions   TOC Interventions Discussed/Reviewed TOC Interventions Discussed       Follow up plan: No further intervention required.   Encounter Outcome:  Patient Visit Completed    Alessandra Grout [facility] Care Management Care Management Community Coordinator Direct Phone: [phone] Toll Free: [phone] Fax: [phone]

## 2023-06-20 NOTE — ED Triage Notes (Signed)
Pt BIB [facility] for bilateral pain to back of upper legs.  Pt with recent admission for brain bleed and multiple ER visits since for falls and pain.  Pt was able to ambulate with cane and standby assist for EMS at home.  VSS  122/70, 96% HR 50

## 2023-06-20 NOTE — ED Notes (Signed)
ED TO INPATIENT HANDOFF REPORT  ED Nurse Name and Phone #: Amil Amen [phone]  S Name/Age/Gender Kyle [name] 77 y.o. male Room/Bed: H016C/H016C  Code Status   Code Status: Full Code  Home/SNF/Other Home Patient oriented to: self, place, time, and situation Is this baseline? Yes   Triage Complete: Triage complete  Chief Complaint Physical deconditioning [R53.81]  Triage Note Pt BIB [facility] for bilateral pain to back of upper legs.  Pt with recent admission for brain bleed and multiple ER visits since for falls and pain.  Pt was able to ambulate with cane and standby assist for EMS at home.  VSS  122/70, 96% HR 50   Allergies Allergies  Allergen Reactions   Ampicillin Rash    Level of Care/Admitting Diagnosis ED Disposition     ED Disposition  Admit   Condition  --   Comment  Hospital Area: [facility] [[id number]]  Level of Care: Med-Surg [16]  May admit patient to [facility] or [facility] if equivalent level of care is available:: No  Covid Evaluation: Asymptomatic - no recent exposure (last 10 days) testing not required  Diagnosis: Physical deconditioning [303050]  Admitting Physician: Dolly Rias [[id number]]  Attending Physician: Dolly Rias [[id number]]  Certification:: I certify this patient will need inpatient services for at least 2 midnights  Expected Medical Readiness: 06/23/2023          B Medical/Surgery History Past Medical History:  Diagnosis Date   Atrial fibrillation (HCC)    History of kidney stones 10-23-2011  SPONTANEOUSLY PASSED   History of migraine    Hyperthyroidism NO MEDS OR TX   MONITORED BY DR SOUTH   Mild acid reflux OCCASIONAL-  WATCHES DIET   PONV (postoperative nausea and vomiting)    Rosacea    Past Surgical History:  Procedure Laterality Date   CARDIOVERSION  10-15-2007  DR Riley Kill   NEW ON-SET A-FIB   ELBOW SURGERY     right   ELBOW SURGERY     KNEE ARTHROSCOPY WITH LATERAL MENISECTOMY   08/07/2012   Procedure: KNEE ARTHROSCOPY WITH LATERAL MENISECTOMY;  Surgeon: Drucilla Schmidt, MD;  Location: [facility];  Service: Orthopedics;  Laterality: Left;  Left Knee Arthroscopy with Partial Lateral Menisectomy    KNEE SURGERY     RIGHT ELBOW SURGERY  1992 (APPROX)   TENDINITIS   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  09-12-2010  DR Derrell Lolling   LEFT LATERAL WALL   VASECTOMY REVERSAL  1990  (APPROX)     A IV Location/Drains/Wounds Patient Lines/Drains/Airways Status     Active Line/Drains/Airways     Name Placement date Placement time Site Days   Peripheral IV 06/20/23 18 G 1" Anterior;Distal;Left;Upper Arm 06/20/23  1724  Arm  less than 1            Intake/Output Last 24 hours No intake or output data in the 24 hours ending 06/20/23 2232  Labs/Imaging Results for orders placed or performed during the hospital encounter of 06/20/23 (from the past 48 hour(s))  Basic metabolic panel     Status: Abnormal   Collection Time: 06/20/23  5:21 PM  Result Value Ref Range   Sodium 134 (L) 135 - 145 mmol/L   Potassium 5.2 (H) 3.5 - 5.1 mmol/L    Comment: HEMOLYSIS AT THIS LEVEL MAY AFFECT RESULT   Chloride 101 98 - 111 mmol/L   CO2 26 22 - 32 mmol/L   Glucose, Bld 102 (H)  70 - 99 mg/dL    Comment: Glucose reference range applies only to samples taken after fasting for at least 8 hours.   BUN 20 8 - 23 mg/dL   Creatinine, Ser 9.56 0.61 - 1.24 mg/dL   Calcium 8.6 (L) 8.9 - 10.3 mg/dL   GFR, Estimated >21 >30 mL/min    Comment: (NOTE) Calculated using the CKD-EPI Creatinine Equation (2021)    Anion gap 7 5 - 15    Comment: Performed at [facility], [address] [place], [place] [address]  CBC     Status: None   Collection Time: 06/20/23  5:21 PM  Result Value Ref Range   WBC 9.2 4.0 - 10.5 K/uL   RBC 5.44 4.22 - 5.81 MIL/uL   Hemoglobin 16.8 13.0 - 17.0 g/dL   HCT 46.9 62.9 - 52.8 %   MCV 90.1 80.0 - 100.0 fL   MCH 30.9 26.0 -  34.0 pg   MCHC 34.3 30.0 - 36.0 g/dL   RDW 41.3 24.4 - 01.0 %   Platelets 293 150 - 400 K/uL   nRBC 0.0 0.0 - 0.2 %    Comment: Performed at [facility], [address], [place], [place] [address]  CK     Status: None   Collection Time: 06/20/23  5:21 PM  Result Value Ref Range   Total CK 77 49 - 397 U/L    Comment: HEMOLYSIS AT THIS LEVEL MAY AFFECT RESULT Performed at [facility], [address], [place], [place] [address]    CT Lumbar Spine Wo Contrast  Result Date: 06/20/2023 CLINICAL DATA:  Back trauma EXAM: CT LUMBAR SPINE WITHOUT CONTRAST TECHNIQUE: Multidetector CT imaging of the lumbar spine was performed without intravenous contrast administration. Multiplanar CT image reconstructions were also generated. RADIATION DOSE REDUCTION: This exam was performed according to the departmental dose-optimization program which includes automated exposure control, adjustment of the mA and/or kV according to patient size and/or use of iterative reconstruction technique. COMPARISON:  None Available. FINDINGS: Segmentation: 5 lumbar type vertebrae. Alignment: Normal. Vertebrae: No acute fracture or focal pathologic process. Paraspinal and other soft tissues: Aortic vascular calcification. No acute paravertebral or paraspinal soft tissue abnormality. Disc levels: At L1-L2, maintained disc space. No canal stenosis. Patent foramen. Mild facet degenerative changes. At L2-L3, mild disc space narrowing. Diffuse disc bulge. Hypertrophic facet degenerative changes with at least mild canal stenosis. Mild left inferior foraminal narrowing. At L2-L3, disc space narrowing and vacuum discs. Diffuse disc bulge. Ligamentum flavum thickening and mild facet degenerative changes. Mild canal stenosis. Mild bilateral foraminal narrowing. At L3-L4, disc space narrowing and vacuum disc. Diffuse disc bulge. No canal stenosis. Facet degenerative changes. Mild to moderate right foraminal narrowing. At L5-S1, disc  space narrowing and vacuum disc. Small central focal disc protrusion but no canal stenosis. Moderate facet degenerative changes. Mild to moderate right foraminal narrowing. IMPRESSION: 1. No CT evidence for acute osseous abnormality. 2. Multilevel degenerative changes. 3. Aortic atherosclerosis. Aortic Atherosclerosis (ICD10-I70.0). Electronically Signed   By: Jasmine Pang M.D.   On: 06/20/2023 18:25   CT PELVIS WO CONTRAST  Result Date: 06/20/2023 CLINICAL DATA:  Trauma fall assess for pelvic fracture EXAM: CT PELVIS WITHOUT CONTRAST TECHNIQUE: Multidetector CT imaging of the pelvis was performed following the standard protocol without intravenous contrast. RADIATION DOSE REDUCTION: This exam was performed according to the departmental dose-optimization program which includes automated exposure control, adjustment of the mA and/or kV according to patient size and/or  use of iterative reconstruction technique. COMPARISON:  None Available. FINDINGS: Urinary Tract:  No abnormality visualized. 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 No suspicious lymph nodes. Atherosclerosis Reproductive:  Prostate is slightly enlarged Other:  Negative for pelvic effusion Musculoskeletal: No acute osseous abnormality. Mild bilateral hip degenerative change IMPRESSION: 1. Negative for acute osseous abnormality. 2. 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 3. Slightly enlarged prostate. Electronically Signed   By: Jasmine Pang M.D.   On: 06/20/2023 18:20    Pending Labs Unresulted Labs (From admission, onward)     Start     Ordered   06/20/23 2208  T4, free  Add-on,   AD        06/20/23 2207   06/20/23 1721  TSH  Once,   R        06/20/23 1721            Vitals/Pain Today's Vitals   06/20/23 1945 06/20/23 2000 06/20/23 2015 06/20/23 2030  BP:  131/89 129/73 (!) 131/92  Pulse:  (!) 57 61 (!) 59  Resp:  17 16 15   Temp:       TempSrc:      SpO2: 100% 100% 98% 100%  Weight:      Height:      PainSc:        Isolation Precautions No active isolations  Medications Medications  acetaminophen (TYLENOL) tablet 1,000 mg (has no administration in time range)  rosuvastatin (CRESTOR) tablet 20 mg (has no administration in time range)  methimazole (TAPAZOLE) tablet 10 mg (has no administration in time range)  pregabalin (LYRICA) capsule 50 mg (has no administration in time range)    Mobility walks with person assist     Focused Assessments Neuro Assessment Handoff:  Swallow screen pass? Yes          Neuro Assessment:   Neuro Checks:      Has TPA been given? No If patient is a Neuro Trauma and patient is going to OR before floor call report to 4N Charge nurse: [phone] or [phone]   R Recommendations: See Admitting Provider Note  Report given to:   Additional Notes: pt needs assistance with balance as he has been having frequent falls
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
## 2023-06-20 NOTE — H&P (Addendum)
History and Physical    Kyle [name] ZOX:[id number] DOB: 1946/05/17 DOA: 06/20/2023  PCP: Adrian Prince, MD   Patient coming from: Home   Chief Complaint:  Chief Complaint  Patient presents with   Leg Pain    HPI:  Kyle [name] is a 77 y.o. 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  Additional history A-fib no longer on anticoagulation, hyperthyroidism on medical therapy, hypertension, who presents from home due to progressive weakness in family no longer able to care for him at home.  He was discharged on 9/20 and received outpatient physical therapy and Occupational Therapy.  However he has had a progressive decline at home.  Mostly he is bedbound at this point.  His wife is unable to lift him for transfers.  In addition to his weakness he complains of severe and limiting posterior buttock pain that radiates down the posterior thigh and is worse with flexion at the hip or standing from a seated position.  No radiation past the knee.  There is no numbness or weakness.  No history of incontinence.  Prior to his recent hemorrhagic stroke he did not have any radicular or other leg pain.  He was very active, able to play pickle ball.  He had a fall on morning of 9/25 and hit his head.  Was seen in the ED same day and CT head with expected evolutionary changes of his intraparenchymal hemorrhage.  There was no new bleeding.  He has not had another fall since that time.  Wife is very concerned about his safety at home.   Review of Systems:  ROS complete and negative except as marked above   Allergies  Allergen Reactions   Ampicillin Rash    Prior to Admission medications   Medication Sig Start Date End Date Taking? Authorizing Provider  doxycycline (PERIOSTAT) 20 MG  tablet Take 20 mg by mouth 2 (two) times daily. 05/23/23  Yes [provider]  methimazole (TAPAZOLE) 10 MG tablet Take 10 mg by mouth every evening. 04/11/23  Yes [provider]  oxyCODONE-acetaminophen (PERCOCET/ROXICET) 5-325 MG tablet Take 2 tablets by mouth every 6 (six) hours as needed for severe pain. 06/15/23  Yes Antony Madura, PA-C  pregabalin (LYRICA) 50 MG capsule Take 1 capsule (50 mg total) by mouth 2 (two) times daily. 06/18/23  Yes Gerhard Munch, MD  rosuvastatin (CRESTOR) 20 MG tablet TAKE 1 TABLET DAILY. SCHEDULE OFFICE VISIT FOR FUTURE REFILLS Patient taking differently: Take 20 mg by mouth every evening. 01/09/23  Yes Lewayne Bunting, MD  prochlorperazine (COMPAZINE) 10 MG tablet Take 1 tablet (10 mg total) by mouth 2 (two) times daily as needed (nausea, vomiting, headache). 06/15/23   Antony Madura, PA-C    Past Medical History:  Diagnosis Date   Atrial fibrillation [facility])    History of kidney stones 10-23-2011  SPONTANEOUSLY PASSED   History of migraine    Hyperthyroidism NO MEDS OR TX   MONITORED BY DR SOUTH   Mild acid reflux OCCASIONAL-  WATCHES DIET   PONV (postoperative nausea and vomiting)    Rosacea     Past Surgical History:  Procedure Laterality Date   CARDIOVERSION  10-15-2007  DR Riley Kill   NEW ON-SET A-FIB   ELBOW SURGERY     right  ELBOW SURGERY     KNEE ARTHROSCOPY WITH LATERAL MENISECTOMY  08/07/2012   Procedure: KNEE ARTHROSCOPY WITH LATERAL MENISECTOMY;  Surgeon: Drucilla Schmidt, MD;  Location: [facility];  Service: Orthopedics;  Laterality: Left;  Left Knee Arthroscopy with Partial Lateral Menisectomy    KNEE SURGERY     RIGHT ELBOW SURGERY  1992 (APPROX)   TENDINITIS   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 He has never used smokeless tobacco. He reports current alcohol use of  about 3.0 - 4.0 standard drinks of alcohol per week. He reports that he does not use drugs.  Family History  Problem Relation Age of Onset   Stroke Mother      Physical Exam: Vitals:   06/20/23 1945 06/20/23 2000 06/20/23 2015 06/20/23 2030  BP:  131/89 129/73 (!) 131/92  Pulse:  (!) 57 61 (!) 59  Resp:  17 16 15   Temp:      TempSrc:      SpO2: 100% 100% 98% 100%  Weight:      Height:        Gen: Awake, alert, NAD   CV: Regular, normal S1, S2, no murmurs  Resp: Normal WOB, CTAB  Abd: Flat, normoactive, nontender MSK: Symmetric, no edema  Skin: No rashes or lesions to exposed skin  Neuro: Alert and interactive, CN II through XII intact, motor is 5 out of 5 and symmetric throughout.  Sensation is intact and equal to fine touch, action tremor in the legs.  Straight leg raise at about 30 degrees he has severe pain that reproduces his presenting symptoms.  Psych: euthymic, appropriate    Data review:   Labs reviewed, notable for:  K5.2 CK 77 Other blood counts and chemistries unremarkable  Micro:  Results for orders placed or performed during the hospital encounter of 06/11/23  Resp panel by RT-PCR (RSV, Flu A&B, Covid) Anterior Nasal Swab     Status: None   Collection Time: 06/11/23 11:10 AM   Specimen: Anterior Nasal Swab  Result Value Ref Range Status   SARS Coronavirus 2 by RT PCR NEGATIVE NEGATIVE Final    Comment: (NOTE) SARS-CoV-2 target nucleic acids are NOT DETECTED.  The SARS-CoV-2 RNA is generally detectable in upper respiratory specimens during the acute phase of infection. The lowest concentration of SARS-CoV-2 viral copies this assay can detect is 138 copies/mL. A negative result does not preclude SARS-Cov-2 infection and should not be used as the sole basis for treatment or other patient management decisions. A negative result may occur with  improper specimen collection/handling, submission of specimen other than nasopharyngeal swab, presence of viral  mutation(s) within the areas targeted by this assay, and inadequate number of viral copies(<138 copies/mL). A negative result must be combined with clinical observations, patient history, and epidemiological information. The expected result is Negative.  Fact Sheet for Patients:  BloggerCourse.com  Fact Sheet for Healthcare Providers:  SeriousBroker.it  This test is no t yet approved or cleared by the [place] FDA and  has been authorized for detection and/or diagnosis of SARS-CoV-2 by FDA under an Emergency Use Authorization (EUA). This EUA will remain  in effect (meaning this test can be used) for the duration of the COVID-19 declaration under Section 564(b)(1) of the Act, 21 U.S.C.section 360bbb-3(b)(1), unless the authorization is terminated  or  revoked sooner.       Influenza A by PCR NEGATIVE NEGATIVE Final   Influenza B by PCR NEGATIVE NEGATIVE Final    Comment: (NOTE) The Xpert Xpress SARS-CoV-2/FLU/RSV plus assay is intended as an aid in the diagnosis of influenza from Nasopharyngeal swab specimens and should not be used as a sole basis for treatment. Nasal washings and aspirates are unacceptable for Xpert Xpress SARS-CoV-2/FLU/RSV testing.  Fact Sheet for Patients: BloggerCourse.com  Fact Sheet for Healthcare Providers: SeriousBroker.it  This test is not yet approved or cleared by the [place] FDA and has been authorized for detection and/or diagnosis of SARS-CoV-2 by FDA under an Emergency Use Authorization (EUA). This EUA will remain in effect (meaning this test can be used) for the duration of the COVID-19 declaration under Section 564(b)(1) of the Act, 21 U.S.C. section 360bbb-3(b)(1), unless the authorization is terminated or revoked.     Resp Syncytial Virus by PCR NEGATIVE NEGATIVE Final    Comment: (NOTE) Fact Sheet for  Patients: BloggerCourse.com  Fact Sheet for Healthcare Providers: SeriousBroker.it  This test is not yet approved or cleared by the [place] FDA and has been authorized for detection and/or diagnosis of SARS-CoV-2 by FDA under an Emergency Use Authorization (EUA). This EUA will remain in effect (meaning this test can be used) for the duration of the COVID-19 declaration under Section 564(b)(1) of the Act, 21 U.S.C. section 360bbb-3(b)(1), unless the authorization is terminated or revoked.  Performed at [facility], [address], [place], [place] [address]   MRSA Next Gen by PCR, Nasal     Status: None   Collection Time: 06/11/23  8:19 PM   Specimen: Nasal Mucosa; Nasal Swab  Result Value Ref Range Status   MRSA by PCR Next Gen NOT DETECTED NOT DETECTED Final    Comment: (NOTE) The GeneXpert MRSA Assay (FDA approved for NASAL specimens only), is one component of a comprehensive MRSA colonization surveillance program. It is not intended to diagnose MRSA infection nor to guide or monitor treatment for MRSA infections. Test performance is not FDA approved in patients less than 1 years old. Performed at [facility], [address] [place], [place] [address]     Imaging reviewed:  CT Lumbar Spine Wo Contrast  Result Date: 06/20/2023 CLINICAL DATA:  Back trauma EXAM: CT LUMBAR SPINE WITHOUT CONTRAST TECHNIQUE: Multidetector CT imaging of the lumbar spine was performed without intravenous contrast administration. Multiplanar CT image reconstructions were also generated. RADIATION DOSE REDUCTION: This exam was performed according to the departmental dose-optimization program which includes automated exposure control, adjustment of the mA and/or kV according to patient size and/or use of iterative reconstruction technique. COMPARISON:  None Available. FINDINGS: Segmentation: 5 lumbar type vertebrae.  Alignment: Normal. Vertebrae: No acute fracture or focal pathologic process. Paraspinal and other soft tissues: Aortic vascular calcification. No acute paravertebral or paraspinal soft tissue abnormality. Disc levels: At L1-L2, maintained disc space. No canal stenosis. Patent foramen. Mild facet degenerative changes. At L2-L3, mild disc space narrowing. Diffuse disc bulge. Hypertrophic facet degenerative changes with at least mild canal stenosis. Mild left inferior foraminal narrowing. At L2-L3, disc space narrowing and vacuum discs. Diffuse disc bulge. Ligamentum flavum thickening and mild facet degenerative changes. Mild canal stenosis. Mild bilateral foraminal narrowing. At L3-L4, disc space narrowing and vacuum disc. Diffuse disc bulge. No canal stenosis. Facet degenerative changes. Mild to moderate right foraminal narrowing. At L5-S1, disc space narrowing and vacuum disc. Small central focal disc protrusion but  no canal stenosis. Moderate facet degenerative changes. Mild to moderate right foraminal narrowing. IMPRESSION: 1. No CT evidence for acute osseous abnormality. 2. Multilevel degenerative changes. 3. Aortic atherosclerosis. Aortic Atherosclerosis (ICD10-I70.0). Electronically Signed   By: Jasmine Pang M.D.   On: 06/20/2023 18:25   CT PELVIS WO CONTRAST  Result Date: 06/20/2023 CLINICAL DATA:  Trauma fall assess for pelvic fracture EXAM: CT PELVIS WITHOUT CONTRAST TECHNIQUE: Multidetector CT imaging of the pelvis was performed following the standard protocol without intravenous contrast. RADIATION DOSE REDUCTION: This exam was performed according to the departmental dose-optimization program which includes automated exposure control, adjustment of the mA and/or kV according to patient size and/or use of iterative reconstruction technique. COMPARISON:  None Available. FINDINGS: Urinary Tract:  No abnormality visualized. 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 No suspicious lymph nodes. Atherosclerosis Reproductive:  Prostate is slightly enlarged Other:  Negative for pelvic effusion Musculoskeletal: No acute osseous abnormality. Mild bilateral hip degenerative change IMPRESSION: 1. Negative for acute osseous abnormality. 2. 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 3. Slightly enlarged prostate. Electronically Signed   By: Jasmine Pang M.D.   On: 06/20/2023 18:20    Assessment/Plan:  77 y.o. 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  Additional history A-fib no longer on anticoagulation, hyperthyroidism on medical therapy, hypertension, who presents from home due to progressive weakness in family no longer able to care for him at home.   Deconditioning and generalized weakness Suspect lumbar radiculopathy vs. Pain syndrome from recent stroke  9/20 discharged with plan for outpatient PT.  At this point he is no longer safe at home a risk of further falls and head injury.  Last fall 9/25, and CT head with no new bleeding at that time, stable evolutionary changes of intraparenchymal hemorrhage.  For his lower extremity pain this is reproduced with straight leg raise bilaterally pointing to radicular cause.  However strange that this acutely started after his intraparenchymal hemorrhage, possible pain syndrome from stroke although think this is less likely.  CT of the lumbar spine and pelvis with degenerative changes, at most moderate neuroforaminal narrowing, mild central canal stenosis.  Degenerative changes in the hips.  -PT, OT evaluation -MRI L-spine without contrast to eval for cause of radiculopathy -Symptomatic management: Increase home Lyrica to 75 mg twice daily advised patient  medication may take time to reach full effect.  Tylenol as needed for mild pain.  Can consider antispasmodics but would hold for now due to his already increased risk of falling.  History recent intracerebral hemorrhage, right inferior frontal lobe and basal ganglia Associated edema and midline shift -Last CT 9/25 with stable evolutionary findings of intraparenchymal hemorrhage -BP goal normotensive, currently not on antihypertensives.   Hyperkalemia, mild -Low K diet  Chronic medical problems: Afib: No longer on anticoagulation, currently rate controlled not on beta-blockers. Hyperthyroidism: Check TSH and free T4.  Continue home methimazole  Hyperlipidemia: Continue home rosuvastatin    Body mass index is 27.05 kg/m.    DVT prophylaxis:  SCDs Code Status:  Full Code Diet:  Diet Orders (From admission, onward)    None      Family Communication: Yes discussed with his wife at the bedside Consults: None Admission status:   Inpatient, Med-Surg  Severity of Illness: The appropriate patient status for this patient is INPATIENT. Inpatient status is judged to be reasonable and necessary in order to provide the required intensity of service to ensure the patient's safety. The patient's presenting symptoms, physical exam findings, and initial radiographic and laboratory data in the context of their chronic comorbidities is felt to place them at high risk for further clinical deterioration. Furthermore, it is not anticipated that the patient will be medically stable for discharge from the hospital within 2 midnights of admission.   * I certify that at the point of admission it is my clinical judgment that the patient will require inpatient hospital care spanning beyond 2 midnights from the point of admission due to high intensity of service, high risk for further deterioration and high frequency of surveillance required.*   Dolly Rias, MD [facility]  How to contact the  [facility] Attending or Consulting provider 7A - 7P or covering provider during after hours 7P -7A, for this patient.  Check the care team in [facility] and look for a) attending/consulting [facility] provider listed and b) the [facility] team listed Log into [email or web address] and use [facility]'s universal password to access. If you do not have the password, please contact the hospital operator. Locate the [facility] provider you are looking for under [facility] and page to a number that you can be directly reached. If you still have difficulty reaching the provider, please page the [facility] (Director on Call) for the Hospitalists listed on amion for assistance.  06/20/2023, 10:53 PM

## 2023-06-21 DIAGNOSIS — I610 Nontraumatic intracerebral hemorrhage in hemisphere, subcortical: Secondary | ICD-10-CM

## 2023-06-21 DIAGNOSIS — R5381 Other malaise: Secondary | ICD-10-CM | POA: Diagnosis not present

## 2023-06-21 LAB — MAGNESIUM: Magnesium: 2.3 mg/dL (ref 1.7–2.4)

## 2023-06-21 LAB — PHOSPHORUS: Phosphorus: 4.1 mg/dL (ref 2.5–4.6)

## 2023-06-21 NOTE — Plan of Care (Signed)
  Problem: Education: Goal: Knowledge of disease or condition will improve Outcome: Progressing Goal: Knowledge of secondary prevention will improve (MUST DOCUMENT ALL) Outcome: Progressing Goal: Knowledge of patient specific risk factors will improve Loraine Leriche N/A or DELETE if not current risk factor) Outcome: Progressing   Problem: Intracerebral Hemorrhage Tissue Perfusion: Goal: Complications of Intracerebral Hemorrhage will be minimized Outcome: Progressing   Problem: Coping: Goal: Will verbalize positive feelings about self Outcome: Progressing Goal: Will identify appropriate support needs Outcome: Progressing

## 2023-06-21 NOTE — Evaluation (Signed)
Occupational Therapy Evaluation Patient Details Name: Kyle [name] MRN: [id number] DOB: 06/08/1946 Today's Date: 06/21/2023   History of Present Illness Kyle [name] is a 77 y.o. male  presents from home 9/27 due to progressive weakness. 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  Additional history A-fib no longer on anticoagulation, hyperthyroidism on medical therapy, hypertension.   Clinical Impression   Pt presents with decreased strength, back pain, mobility, memory, awareness, and safety. Pt needing mod+ stim to arouse this session. After given several minutes engaging in SBT test of cognition and pt scoring a 12 indicating mild impairment. Pt wife educated to supervise medication and financial management. Needing up to min A during ADL and transfers. Recommending HHOT to optimize safety in home environment.       If plan is discharge home, recommend the following: A little help with walking and/or transfers;A little help with bathing/dressing/bathroom;Assistance with cooking/housework;Assist for transportation;Direct supervision/assist for financial management;Direct supervision/assist for medications management;Help with stairs or ramp for entrance    Functional Status Assessment  Patient has had a recent decline in their functional status and demonstrates the ability to make significant improvements in function in a reasonable and predictable amount of time.  Equipment Recommendations  BSC/3in1    Recommendations for Other Services       Precautions / Restrictions Precautions Precautions: Fall Restrictions Weight Bearing Restrictions: No      Mobility Bed Mobility Overal bed mobility: Modified Independent             General bed mobility comments:  Reviewed log roll, pt states he's been getting out of bed at home this way for comfort.    Transfers Overall transfer level: Needs assistance Equipment used: Rolling walker (2 wheels) Transfers: Sit to/from Stand Sit to Stand: Supervision           General transfer comment: Supervision for safety, performed from bed and recliner several times. Educated on technique, hand placement and RW set-up. Pt able to performs slowly with some back and LE discomfort that improves once standing upright. It is painful during the transition.      Balance Overall balance assessment: Needs assistance Sitting-balance support: No upper extremity supported, Feet supported Sitting balance-Leahy Scale: Good Sitting balance - Comments: EOB and recliner   Standing balance support: Single extremity supported, During functional activity, Reliant on assistive device for balance Standing balance-Leahy Scale: Poor                             ADL either performed or assessed with clinical judgement   ADL Overall ADL's : Needs assistance/impaired     Grooming: Oral care;Contact guard assist;Standing   Upper Body Bathing: Set up;Sitting   Lower Body Bathing: Contact guard assist;Sit to/from stand   Upper Body Dressing : Set up;Sitting   Lower Body Dressing: Minimal assistance;Sit to/from stand Lower Body Dressing Details (indicate cue type and reason): Min A to tie shoes secondary to dififculty maintaining figure 4 with back pain Toilet Transfer: Ambulation;Regular Toilet;Grab bars   Toileting- Clothing Manipulation and Hygiene: Supervision/safety       Functional mobility during ADLs: Supervision/safety;Rolling walker (2 wheels)       Vision Baseline Vision/History: 1 Wears glasses Ability to See in Adequate Light: 0  Adequate Patient Visual Report: No change from baseline Vision Assessment?: No apparent visual deficits     Perception         Praxis         Pertinent  Vitals/Pain Pain Assessment Pain Assessment: Faces Faces Pain Scale: Hurts even more Pain Location: back BIL LEs with mobility and LB AL Pain Descriptors / Indicators: Sharp Pain Intervention(s): Limited activity within patient's tolerance, Monitored during session     Extremity/Trunk Assessment Upper Extremity Assessment Upper Extremity Assessment: Generalized weakness;LUE deficits/detail LUE Deficits / Details: slightly weaker on L side comapred to R (4/5 MMT) but sensation and coordination appear intaact   Lower Extremity Assessment Lower Extremity Assessment: Defer to PT evaluation   Cervical / Trunk Assessment Cervical / Trunk Assessment: Normal   Communication Communication Communication: No apparent difficulties Cueing Techniques: Verbal cues   Cognition Arousal: Alert Behavior During Therapy: WFL for tasks assessed/performed Overall Cognitive Status: Impaired/Different from baseline Area of Impairment: Attention, Problem solving, Awareness, Safety/judgement, Memory                   Current Attention Level: Sustained Memory: Decreased short-term memory Following Commands: Follows multi-step commands with increased time Safety/Judgement: Decreased awareness of safety, Decreased awareness of deficits Awareness: Emergent Problem Solving: Slow processing, Requires verbal cues General Comments: Pt scoring a 12 on the short blessed test with deficits in STM and ability to state months of the year in the reverse order. Initially needed mod+ stim to arouse at beginning of session and pt wife reporting he has slept a lot more than usual today. RN aware     General Comments       Exercises     Shoulder Instructions      Home Living Family/patient expects to be discharged to:: Unsure Living Arrangements: Spouse/significant other Available Help at Discharge: Family Type of Home: House Home Access: Stairs to enter [employer] of Steps: 2 at garage (no  rail) Entrance Stairs-Rails: None Home Layout: Multi-level;Full bath on main level (3 level) Alternate Level Stairs-Number of Steps: flight Alternate Level Stairs-Rails: Right Bathroom Shower/Tub: Tub/shower unit   Bathroom Toilet: Standard Bathroom Accessibility: Yes   Home Equipment: Cane - single point   Additional Comments: 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 enjoys golfing and traveling. Recently went to [employer] in [place]. main floor of home is being renovated, has to go upstairs to sleep/bathe.  Lives With: Spouse    Prior Functioning/Environment Prior Level of Function : Driving;Independent/Modified Independent                        OT Problem List: Decreased activity tolerance;Pain;Decreased cognition;Impaired balance (sitting and/or standing);Decreased knowledge of use of DME or AE;Decreased knowledge of precautions      OT Treatment/Interventions: Self-care/ADL training;DME and/or AE instruction;Therapeutic activities;Cognitive remediation/compensation;Balance training;Patient/family education    OT Goals(Current goals can be found in the care plan section) Acute Rehab OT Goals Patient Stated Goal: go home OT Goal Formulation: With patient Time For Goal Achievement: 07/05/23 Potential to Achieve Goals: Good  OT Frequency: Min 1X/week    Co-evaluation              AM-PAC OT "6 Clicks" Daily Activity     Outcome Measure Help from another person eating meals?: None Help from another person taking care of personal grooming?: A Little Help from another person toileting, which includes using toliet, bedpan, or urinal?: A Little Help  from another person bathing (including washing, rinsing, drying)?: A Little Help from another person to put on and taking off regular upper body clothing?: A Little Help from another person to put on and taking off regular lower body clothing?: A Little 6 Click Score: 19   End of  Session Equipment Utilized During Treatment: Gait belt;Rolling walker (2 wheels) Nurse Communication: Mobility status  Activity Tolerance: Patient tolerated treatment well Patient left: in chair;with call bell/phone within reach;with chair alarm set;with family/visitor present  OT Visit Diagnosis: Other abnormalities of gait and mobility (R26.89);Pain;Other symptoms and signs involving cognitive function                Time: 1300-1341 OT Time Calculation (min): 41 min Charges:  OT General Charges $OT Visit: 1 Visit OT Evaluation $OT Eval Moderate Complexity: 1 Mod OT Treatments $Self Care/Home Management : 23-37 mins  Tyler Deis, OTR/L [facility] Acute Rehabilitation Office: [phone]   Myrla Halsted 06/21/2023, 5:12 PM

## 2023-06-21 NOTE — Care Management Obs Status (Signed)
MEDICARE OBSERVATION STATUS NOTIFICATION   Patient Details  Name: Kyle [name] MRN: [id number] Date of Birth: [birth year]   Medicare Observation Status Notification Given:  Yes    Isaias Cowman, RN 06/21/2023, 5:14 PM

## 2023-06-21 NOTE — Hospital Course (Signed)
Kyle [name] is a 77 y.o. male with past medical history of hypertensive intracerebral hemorrhage, atrial fibrillation not on anticoagulation, hypothyroidism, hypertension presented to the hospital with progressive weakness and family no longer able to care for him at home.  He was discharged on 06/13/23 and received outpatient physical therapy and Occupational Therapy but despite that had progressive decline at home to the point that he was bedbound.  His wife is unable to lift him for transfers.  His wife is unable to lift him for transfers.  He also complained of back pain and had a recent fall 06/18/2023 and hit his head.  He was seen in the ED the same day and CT head showed expected evolutionary changes so was sent home.  Assessment/Plan:   Deconditioning and generalized weakness Suspect lumbar radiculopathy vs. Pain syndrome from recent stroke  Patient was getting outpatient PT but despite that he had a fall and progressive decline to the point that family is unable to assist at home.   CT scan of the head without any acute findings except for evolutionary changes.  Does have a straight leg raising test positive.   CT of the lumbar spine and pelvis with degenerative changes, at most moderate neuroforaminal narrowing, mild central canal stenosis.  Degenerative changes in the hips.  Will get PT OT evaluation. -PT, OT evaluation. MRI L-spine showed no acute abnormality but mild right L4-L5 and L5-S1 neuroforaminal stenosis with bilateral lateral recess stenosis.-Continue pain management, Lyrica dose has been increased.  Continue Tylenol.   History recent intracerebral hemorrhage. Patient did have right inferior frontal lobe and basal ganglia with Associated edema and midline shift. Last CT head scan on 9/25 with stable evolutionary findings of intraparenchymal hemorrhage.  Focus on normotension.  Not on antihypertensives at this time.   Hyperkalemia, mild Patient had potassium of 5.2.  Will check BMP  in AM.    Afib: No longer on anticoagulation, currently rate controlled not on beta-blockers.  Hyperthyroidism: TSH elevated at 7.4 with free T4 of 0.6. Continue home methimazole   Hyperlipidemia: Continue Crestor.

## 2023-06-21 NOTE — Evaluation (Addendum)
Physical Therapy Evaluation Patient Details Name: Kyle [name] MRN: [id number] DOB: October 07, 1945 Today's Date: 06/21/2023  History of Present Illness  Kyle [name] is a 77 y.o. male  presents from home 9/27 due to progressive weakness. 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  Additional history A-fib no longer on anticoagulation, hyperthyroidism on medical therapy, hypertension.    Clinical Impression  Pt admitted with above diagnosis. Wife believes pt fell in bathroom as a result of pt attempting to stand on one leg while putting his pants on. He has been having low back pain which radiates into bil LEs, posterior thighs, not beyond the knees. He is having difficulty rising from low surfaces due to exacerbation of this pain during transitions but improves once he is standing. + SLR, and Slump test, with concordant symptoms. Lhermittes sign absent, DTRs diminished bil patellae. No neck pain or paresthesias of UEs, no changes in UE strength. Symptoms worse with lumbar flexion. Signs/symptoms suggestive of lumbar radiculopathy. Educated pt and wife on findings, body mechanics with transfers, symptom awareness, and modifications with daily tasks to minimize symptoms, and maximize safety. He was able to transfer from various surfaces, multiple times with cues for technique, effortful with mild pain during these transitions but stable with upright stance using RW. Gait steady with RW, moderate reliance to unload. Recommend RW and 3-in-1. Feel these devices will greatly help functional independence and safety with HHPT follow-up. Pt currently with functional limitations due to the deficits listed below (see PT Problem List). Pt will benefit from acute skilled PT to increase their independence  and safety with mobility to allow discharge.           If plan is discharge home, recommend the following: A little help with walking and/or transfers;A little help with bathing/dressing/bathroom;Assistance with cooking/housework;Assist for transportation;Help with stairs or ramp for entrance   Can travel by private vehicle    yes    Equipment Recommendations Rolling walker (2 wheels);BSC/3in1     Functional Status Assessment Patient has had a recent decline in their functional status and demonstrates the ability to make significant improvements in function in a reasonable and predictable amount of time.     Precautions / Restrictions Precautions Precautions: Fall Restrictions Weight Bearing Restrictions: No      Mobility  Bed Mobility Overal bed mobility: Modified Independent             General bed mobility comments: Reviewed log roll, pt states he's been getting out of bed at home this way for comfort.    Transfers Overall transfer level: Needs assistance Equipment used: Rolling walker (2 wheels) Transfers: Sit to/from Stand Sit to Stand: Supervision           General transfer comment: Supervision for safety, performed from bed and recliner several times. Educated on technique, hand placement and RW set-up. Pt able to performs slowly with some back and LE discomfort that improves once standing upright. It is painful during the transition.    Ambulation/Gait Ambulation/Gait assistance: Supervision Gait Distance (Feet): 110 Feet Assistive device: Rolling walker (2 wheels) Gait Pattern/deviations: Step-through pattern, Decreased stride length Gait velocity: decr Gait velocity interpretation: <1.8 ft/sec, indicate of risk for recurrent falls   General Gait Details: Good RW control. Fair reliance on RW for support, mostly to  unload pressure on LEs but did not demonstrate evidence of LOB or buckling. educated on appropriate use and gradual reduction of UE support  as tolerated, upright posture, and symptom awareness.  Stairs            Wheelchair Mobility     Tilt Bed    Modified Rankin (Stroke Patients Only)       Balance Overall balance assessment: Needs assistance Sitting-balance support: No upper extremity supported, Feet supported Sitting balance-Leahy Scale: Good Sitting balance - Comments: EOB and recliner   Standing balance support: Single extremity supported, During functional activity, Reliant on assistive device for balance Standing balance-Leahy Scale: Poor Standing balance comment: stable with single UE for support on RW, pain guarded.                             Pertinent Vitals/Pain Pain Assessment Pain Assessment: Faces Faces Pain Scale: Hurts even more Pain Location: back BIL LEs with lumbar flexion and neural tension tests. 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 enjoys golfing and traveling. Recently went to [employer] in [place]. main floor of home is being renovated, has to go upstairs to sleep/bathe.    Prior Function Prior Level of Function : Driving;Independent/Modified Independent                     Extremity/Trunk Assessment   Upper Extremity Assessment Upper Extremity Assessment: Defer to OT evaluation    Lower Extremity Assessment Lower Extremity Assessment:  Generalized weakness       Communication   Communication Communication: No apparent difficulties Cueing Techniques: Verbal cues  Cognition Arousal: Alert Behavior During Therapy: WFL for tasks assessed/performed Overall Cognitive Status: Within Functional Limits for tasks assessed                                          General Comments General comments (skin integrity, edema, etc.): SLR and Slump tests positive bil with concordant symptoms reported; Lhermitte's sign absent. DTRs diminished bil patellae    Exercises Other Exercises Other Exercises: Seated TrA squeeze   Assessment/Plan    PT Assessment Patient needs continued PT services  PT Problem List Decreased strength;Decreased activity tolerance;Decreased balance;Decreased mobility;Decreased coordination;Decreased knowledge of use of DME;Impaired sensation;Decreased range of motion;Pain       PT Treatment Interventions DME instruction;Gait training;Stair training;Functional mobility training;Therapeutic activities;Therapeutic exercise;Balance training;Neuromuscular re-education;Patient/family education;Modalities;Manual techniques    PT Goals (Current goals can be found in the Care Plan section)  Acute Rehab PT Goals Patient Stated Goal: Reduce pain PT Goal Formulation: With patient/family Time For Goal Achievement: 07/04/23 Potential to Achieve Goals: Good    Frequency Min 1X/week     Co-evaluation               AM-PAC PT "6 Clicks" Mobility  Outcome Measure Help needed turning from your  back to your side while in a flat bed without using bedrails?: None Help needed moving from lying on your back to sitting on the side of a flat bed without using bedrails?: None Help needed moving to and from a bed to a chair (including a wheelchair)?: A Little Help needed standing up from a chair using your arms (e.g., wheelchair or bedside chair)?: A Little Help needed to walk in hospital room?: A  Little Help needed climbing 3-5 steps with a railing? : A Little 6 Click Score: 20    End of Session Equipment Utilized During Treatment: Gait belt Activity Tolerance: Patient tolerated treatment well Patient left: in chair;with call bell/phone within reach;with chair alarm set;with family/visitor present   PT Visit Diagnosis: Unsteadiness on feet (R26.81);Other abnormalities of gait and mobility (R26.89);Muscle weakness (generalized) (M62.81);Difficulty in walking, not elsewhere classified (R26.2);Other symptoms and signs involving the nervous system (R29.898);History of falling (Z91.81);Pain Pain - Right/Left:  (BIL) Pain - part of body:  (Back and posterior thighs)    Time: 6213-0865 PT Time Calculation (min) (ACUTE ONLY): 56 min   Charges:   PT Evaluation $PT Eval Moderate Complexity: 1 Mod PT Treatments $Gait Training: 8-22 mins $Therapeutic Activity: 8-22 mins $Self Care/Home Management: 8-22 PT General Charges $$ ACUTE PT VISIT: 1 Visit         Kathlyn Sacramento, PT, DPT [facility]  Rehabilitation Services Physical Therapist Office: [phone] Website: [email or web address]   Berton Mount 06/21/2023, 12:53 PM

## 2023-06-21 NOTE — Care Management CC44 (Signed)
Condition Code 44 Documentation Completed  Patient Details  Name: Kyle [name] MRN: [id number] Date of Birth: 1946/07/07   Condition Code 44 given:  Yes Patient signature on Condition Code 44 notice:  Yes Documentation of 2 MD's agreement:  Yes Code 44 added to claim:  Yes    Isaias Cowman, RN 06/21/2023, 5:14 PM

## 2023-06-21 NOTE — Progress Notes (Addendum)
Met with pt's wife and 2 daughters. They are upset because pt's bed status was changed from inpt to obs. They don't understand why he doesn't meet criteria for inpt. Explained to them Medicare Part A and Part B for inpt and obs coverage. Per wife, his weakness is getting worse and he is falling. His wife is not able to take care of him at home. Wife reports that she called 911 because she wasn't able to get him up. They are upset that he was DC from ICU and he was not evaluated by PT. They report that he came to the ED after he was DC from ICU because of the leg pain and he was DC. He is scheduled to start outpt therapy next week. His daughter wants to see the medical records. They also want to talk to the MD. They are upset because he lost his glasses today after he went to radiology. Wife reports that he went to radiology for an MRI, he removed his glasses and radiology lost the glasses. Security was called and they couldn't find the glasses. Wife reports that the glasses are expensive. Discussed the DC plan. One daughter discussed inpt rehab. She doesn't understand why he did not go to inpt rehab when he was in ICU. Explained Medicare criteria for SNF and he doesn't meet criteria because he is OBS. Wife is not interested in SNF. Informed them that we'll f/u with PT/OT recommendations. Encouraged them to talk to the nurse and MD. We will continue to f/u to assist with the DC plan.

## 2023-06-21 NOTE — Progress Notes (Addendum)
PROGRESS NOTE    Kyle [name]  ZOX:[id number] DOB: 1946/05/08 DOA: 06/20/2023 PCP: Adrian Prince, MD    Brief Narrative:   Kyle [name] is a 77 y.o. male with past medical history of hypertensive intracerebral hemorrhage, atrial fibrillation not on anticoagulation, hypothyroidism, hypertension presented to the hospital with progressive weakness and family no longer able to care for him at home.  He was discharged on 06/13/23 and received outpatient physical therapy and Occupational Therapy but despite that had progressive decline at home to the point that he was bedbound.  His wife is unable to lift him for transfers.  His wife is unable to lift him for transfers.  He also complained of back pain and had a recent fall 06/18/2023 and hit his head.  He was seen in the ED the same day and CT head showed expected evolutionary changes so was sent home.  At this time patient has been considered for admission to hospital for further management.  Assessment/Plan:   Deconditioning and generalized weakness Suspect lumbar radiculopathy vs. Pain syndrome from recent stroke  Patient was getting outpatient PT but despite that he had a fall and progressive decline to the point that family is unable to assist at home.   CT scan of the head without any acute findings except for evolutionary changes.  Does have a straight leg raising test positive.   CT of the lumbar spine and pelvis with degenerative changes, at most moderate neuroforaminal narrowing, mild central canal stenosis.  Degenerative changes in the hips.[facility] MRI L-spine showed no acute abnormality but mild right L4-L5 and L5-S1 neuroforaminal stenosis with bilateral lateral recess stenosis.-Continue pain management, Lyrica dose has been increased.  Continue Tylenol.  PT has been consulted and recommend home health PT on discharge  History recent intracerebral hemorrhage. Patient did have right inferior frontal lobe and basal ganglia with Associated edema and  midline shift. Last CT head scan on 9/25 with stable evolutionary findings of intraparenchymal hemorrhage.  Focus on normotension.  Not on antihypertensives at this time.   Hyperkalemia, mild Patient had potassium of 5.2.  Will check BMP in AM.   Afib: No longer on anticoagulation, currently rate controlled not on beta-blockers.  Hyperthyroidism: TSH elevated at 7.4 with free T4 of 0.6. Continue home methimazole   Hyperlipidemia: Continue Crestor.      DVT prophylaxis: SCDs Start: 06/20/23 2134   Code Status:     Code Status: Full Code  Disposition: PT OT recommending home health.  Will continue to monitor while in the hospital.  Status is: Inpatient  Remains inpatient appropriate because: Deconditioning, debility, PT OT evaluation and possible need for rehabilitation.   Family Communication:   Spoke with the patient's wife on the phone and updated her about the clinical condition of the patient.   Consultants:  None  Procedures:  None  Antimicrobials:  None  Anti-infectives (From admission, onward)    Start     Dose/Rate Route Frequency Ordered Stop   06/20/23 2200  doxycycline (PERIOSTAT) tablet 20 mg  Status:  Discontinued        20 mg Oral 2 times daily 06/20/23 2137 06/20/23 2145       Subjective: Today, patient was seen and examined at bedside.  Patient complains of generalized weakness fatigue.  Complains of mild back pain.  Objective: Vitals:   06/20/23 2030 06/21/23 0132 06/21/23 0504 06/21/23 0747  BP: (!) 131/92 135/85 99/83 125/83  Pulse: (!) 59 66 (!) 56 (!) 56  Resp:  15 18 18 18   Temp:  98.2 F (36.8 C)  97.9 F (36.6 C)  TempSrc:      SpO2: 100% 100% 97% 98%  Weight:      Height:        Intake/Output Summary (Last 24 hours) at 06/21/2023 1039 Last data filed at 06/21/2023 0150 Gross per 24 hour  Intake --  Output 1 ml  Net -1 ml   Filed Weights   06/20/23 1942  Weight: 93 kg    Physical Examination: Body mass index is 27.05  kg/m.   General:  Average built, not in obvious distress HENT:   No scleral pallor or icterus noted. Oral mucosa is moist.  Chest:  Clear breath sounds.  Diminished breath sounds bilaterally. No crackles or wheezes.  CVS: S1 &S2 heard. No murmur.  Regular rate and rhythm. Abdomen: Soft, nontender, nondistended.  Bowel sounds are heard.   Extremities: No cyanosis, clubbing or edema.  Straight leg raising test positive bilaterally. Psych: Alert, awake and oriented, normal mood CNS:  No cranial nerve deficits.  Moves all extremities.  Generalized weakness noted. Skin: Warm and dry.  No rashes noted.  Data Reviewed:   CBC: Recent Labs  Lab 06/14/23 1447 06/18/23 0947 06/20/23 1721  WBC 8.5 8.7 9.2  NEUTROABS 6.8  --   --   HGB 16.6 17.1* 16.8  HCT 48.5 50.5 49.0  MCV 89.6 90.3 90.1  PLT 215 254 293    Basic Metabolic Panel: Recent Labs  Lab 06/14/23 1447 06/18/23 0947 06/20/23 1721  NA 134* 133* 134*  K 4.3 4.2 5.2*  CL 100 98 101  CO2 24 25 26   GLUCOSE 140* 144* 102*  BUN 16 19 20   CREATININE 1.04 1.08 1.10  CALCIUM 8.5* 9.0 8.6*  MG  --   --  2.3  PHOS  --   --  4.1    Liver Function Tests: Recent Labs  Lab 06/14/23 1447 06/18/23 0947  AST 20 20  ALT 15 21  ALKPHOS 62 67  BILITOT 1.9* 1.3*  PROT 6.6 6.7  ALBUMIN 3.9 3.8     Radiology Studies: MR LUMBAR SPINE WO CONTRAST  Result Date: 06/20/2023 CLINICAL DATA:  Low back pain after recent fall EXAM: MRI LUMBAR SPINE WITHOUT CONTRAST TECHNIQUE: Multiplanar, multisequence MR imaging of the lumbar spine was performed. No intravenous contrast was administered. COMPARISON:  Lumbar spine CT 06/20/2023 FINDINGS: Segmentation:  Standard Alignment:  Grade 1 retrolisthesis at L2-3 Vertebrae:  No fracture, evidence of discitis, or bone lesion. Conus medullaris and cauda equina: Conus extends to the L1 level. Conus and cauda equina appear normal. Paraspinal and other soft tissues: Negative. Disc levels: L1-L2: Normal  disc space and facet joints. No spinal canal stenosis. No neural foraminal stenosis. L2-L3: Disc space narrowing with small bulge and bilateral endplate spurring. No spinal canal stenosis. No neural foraminal stenosis. L3-L4: Small disc bulge with endplate spurring. Narrowing of both lateral recesses without central spinal canal stenosis. No neural foraminal stenosis. L4-L5: Small disc bulge with endplate spurring. No spinal canal stenosis. Mild right neural foraminal stenosis. L5-S1: Small disc bulge. No spinal canal stenosis. Mild right neural foraminal stenosis. Visualized sacrum: Normal. IMPRESSION: 1. No acute abnormality of the lumbar spine. 2. Mild right L4-5 and L5-S1 neural foraminal stenosis. 3. Bilateral lateral recess stenosis at L3-L4 may serve as a source of L4 radiculopathy. Electronically Signed   By: Deatra Robinson M.D.   On: 06/20/2023 23:34   CT Lumbar Spine Wo  Contrast  Result Date: 06/20/2023 CLINICAL DATA:  Back trauma EXAM: CT LUMBAR SPINE WITHOUT CONTRAST TECHNIQUE: Multidetector CT imaging of the lumbar spine was performed without intravenous contrast administration. Multiplanar CT image reconstructions were also generated. RADIATION DOSE REDUCTION: This exam was performed according to the departmental dose-optimization program which includes automated exposure control, adjustment of the mA and/or kV according to patient size and/or use of iterative reconstruction technique. COMPARISON:  None Available. FINDINGS: Segmentation: 5 lumbar type vertebrae. Alignment: Normal. Vertebrae: No acute fracture or focal pathologic process. Paraspinal and other soft tissues: Aortic vascular calcification. No acute paravertebral or paraspinal soft tissue abnormality. Disc levels: At L1-L2, maintained disc space. No canal stenosis. Patent foramen. Mild facet degenerative changes. At L2-L3, mild disc space narrowing. Diffuse disc bulge. Hypertrophic facet degenerative changes with at least mild canal  stenosis. Mild left inferior foraminal narrowing. At L2-L3, disc space narrowing and vacuum discs. Diffuse disc bulge. Ligamentum flavum thickening and mild facet degenerative changes. Mild canal stenosis. Mild bilateral foraminal narrowing. At L3-L4, disc space narrowing and vacuum disc. Diffuse disc bulge. No canal stenosis. Facet degenerative changes. Mild to moderate right foraminal narrowing. At L5-S1, disc space narrowing and vacuum disc. Small central focal disc protrusion but no canal stenosis. Moderate facet degenerative changes. Mild to moderate right foraminal narrowing. IMPRESSION: 1. No CT evidence for acute osseous abnormality. 2. Multilevel degenerative changes. 3. Aortic atherosclerosis. Aortic Atherosclerosis (ICD10-I70.0). Electronically Signed   By: Jasmine Pang M.D.   On: 06/20/2023 18:25   CT PELVIS WO CONTRAST  Result Date: 06/20/2023 CLINICAL DATA:  Trauma fall assess for pelvic fracture EXAM: CT PELVIS WITHOUT CONTRAST TECHNIQUE: Multidetector CT imaging of the pelvis was performed following the standard protocol without intravenous contrast. RADIATION DOSE REDUCTION: This exam was performed according to the departmental dose-optimization program which includes automated exposure control, adjustment of the mA and/or kV according to patient size and/or use of iterative reconstruction technique. COMPARISON:  None Available. FINDINGS: Urinary Tract:  No abnormality visualized. 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 No suspicious lymph nodes. Atherosclerosis Reproductive:  Prostate is slightly enlarged Other:  Negative for pelvic effusion Musculoskeletal: No acute osseous abnormality. Mild bilateral hip degenerative change IMPRESSION: 1. Negative for acute osseous abnormality. 2. 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 3. Slightly enlarged prostate. Electronically Signed   By: Jasmine Pang M.D.   On:  06/20/2023 18:20      LOS: 1 day    Joycelyn Das, MD [facility] Available via Epic secure chat 7am-7pm After these hours, please refer to coverage provider listed on [email or web address] 06/21/2023, 10:39 AM

## 2023-06-22 DIAGNOSIS — I610 Nontraumatic intracerebral hemorrhage in hemisphere, subcortical: Secondary | ICD-10-CM | POA: Diagnosis not present

## 2023-06-22 DIAGNOSIS — R5381 Other malaise: Secondary | ICD-10-CM | POA: Diagnosis not present

## 2023-06-22 MED ORDER — PREGABALIN 75 MG PO CAPS: 75.0000 mg | ORAL_CAPSULE | Freq: Two times a day (BID) | ORAL | 2 refills | Status: DC

## 2023-06-22 NOTE — Progress Notes (Signed)
RNCM received HHPT orders and DME orders for RW and 3N1.  RNCM spoke to patient and his wife and offered choice for services. No preference, so Denyse Amass at [place] contacted with [facility] orders and confirmation received.  [facility] contacted with DME orders and confirmation received-DME to be delivered to patient's room prior to d/c home today.

## 2023-06-22 NOTE — Discharge Summary (Signed)
Physician Discharge Summary  Kyle [name] ZOX:[id number] DOB: [birth year] DOA: 06/20/2023  PCP: Adrian Prince, MD  Admit date: 06/20/2023 Discharge date: 06/22/2023  Admitted From: Home  Discharge disposition: Home with home health   Recommendations for Outpatient Follow-Up:   Follow up with your primary care provider in one week.  Check CBC, BMP, magnesium in the next visit   Discharge Diagnosis:   Principal Problem:   Physical deconditioning Active Problems:   Intracerebral hemorrhage (HCC)   Discharge Condition: Improved.  Diet recommendation: Low sodium, heart healthy.    Wound care: None.  Code status: Full.   History of Present Illness:   Kyle [name] is a 77 y.o. male with past medical history of hypertensive intracerebral hemorrhage, atrial fibrillation not on anticoagulation, hypothyroidism, hypertension presented to the hospital with progressive weakness and family no longer able to care for him at home.  He was discharged on 06/13/23 and received outpatient physical therapy and Occupational Therapy but despite that had progressive decline at home to the point that he was bedbound.  His wife is unable to lift him for transfers.  His wife is unable to lift him for transfers.  He also complained of back pain and had a recent fall 06/18/2023 and hit his head.  He was seen in the ED the same day and CT head showed expected evolutionary changes so was sent home. Patient was then  considered for admission to hospital for further management.    Hospital Course:   Following conditions were addressed during hospitalization as listed below,  Deconditioning and generalized weakness Lumbar radiculopathy. Patient was getting outpatient PT but despite that he had a fall and progressive decline to the point that family was unable to assist at home.   CT scan of the head without any acute findings except for evolutionary changes.  Does have a straight leg raising test positive.    CT of the lumbar spine and pelvis with degenerative changes, at most moderate neuroforaminal narrowing, mild central canal stenosis.  Degenerative changes in the hips . MRI L-spine showed no acute abnormality but mild right L4-L5 and L5-S1 neuroforaminal stenosis with bilateral lateral recess stenosis. Continue pain management, Lyrica dose has been increased on discharge.  Continue Tylenol.  PT has been consulted and recommend home health PT on discharge.  Communicated with the patient's spouse yesterday who wishes him to come home with resources.   History recent intracerebral hemorrhage. Patient did have right inferior frontal lobe and basal ganglia with a associated edema and midline shift. Last CT head scan on 9/25 with stable evolutionary findings of intraparenchymal hemorrhage.  Focus on normotension.  Not on antihypertensives at this time.   Afib: No longer on anticoagulation, currently rate controlled not on beta-blockers.   Hyperthyroidism: TSH elevated at 7.4 with free T4 of 0.6. Continue home methimazole    Hyperlipidemia: Continue Crestor.  Disposition.  At this time, patient is stable for disposition home with home health services.  Recommend follow-up with PCP as outpatient.  Medical Consultants:   None.  Procedures:    None Subjective:   Today, patient was seen and examined at bedside.  Feels okay.  Nursing staff reported that he was able to ambulate.  Discharge Exam:   Vitals:   06/22/23 0436 06/22/23 0744  BP: 112/64 (!) 138/95  Pulse: 64 100  Resp: 18 16  Temp: (!) 97.4 F (36.3 C) 97.8 F (36.6 C)  SpO2: 98% 98%   Vitals:   06/21/23 0747  06/21/23 1623 06/22/23 0436 06/22/23 0744  BP: 125/83 111/70 112/64 (!) 138/95  Pulse: (!) 56 (!) 56 64 100  Resp: 18 18 18 16   Temp: 97.9 F (36.6 C) 98.2 F (36.8 C) (!) 97.4 F (36.3 C) 97.8 F (36.6 C)  TempSrc:   Oral   SpO2: 98% 95% 98% 98%  Weight:      Height:       Body mass index is 27.05 kg/m.    General: Alert awake, not in obvious distress HENT: pupils equally reacting to light,  No scleral pallor or icterus noted. Oral mucosa is moist.  Chest:    Diminished breath sounds bilaterally. No crackles or wheezes.  CVS: S1 &S2 heard. No murmur.  Regular rate and rhythm. Abdomen: Soft, nontender, nondistended.  Bowel sounds are heard.   Extremities: No cyanosis, clubbing or edema.   Psych: Alert, awake and oriented, normal mood CNS:  No cranial nerve deficits.  Moves all extremities Skin: Warm and dry.  No rashes noted.  The results of significant diagnostics from this hospitalization (including imaging, microbiology, ancillary and laboratory) are listed below for reference.     Diagnostic Studies:   MR LUMBAR SPINE WO CONTRAST  Result Date: 06/20/2023 CLINICAL DATA:  Low back pain after recent fall EXAM: MRI LUMBAR SPINE WITHOUT CONTRAST TECHNIQUE: Multiplanar, multisequence MR imaging of the lumbar spine was performed. No intravenous contrast was administered. COMPARISON:  Lumbar spine CT 06/20/2023 FINDINGS: Segmentation:  Standard Alignment:  Grade 1 retrolisthesis at L2-3 Vertebrae:  No fracture, evidence of discitis, or bone lesion. Conus medullaris and cauda equina: Conus extends to the L1 level. Conus and cauda equina appear normal. Paraspinal and other soft tissues: Negative. Disc levels: L1-L2: Normal disc space and facet joints. No spinal canal stenosis. No neural foraminal stenosis. L2-L3: Disc space narrowing with small bulge and bilateral endplate spurring. No spinal canal stenosis. No neural foraminal stenosis. L3-L4: Small disc bulge with endplate spurring. Narrowing of both lateral recesses without central spinal canal stenosis. No neural foraminal stenosis. L4-L5: Small disc bulge with endplate spurring. No spinal canal stenosis. Mild right neural foraminal stenosis. L5-S1: Small disc bulge. No spinal canal stenosis. Mild right neural foraminal stenosis. Visualized sacrum:  Normal. IMPRESSION: 1. No acute abnormality of the lumbar spine. 2. Mild right L4-5 and L5-S1 neural foraminal stenosis. 3. Bilateral lateral recess stenosis at L3-L4 may serve as a source of L4 radiculopathy. Electronically Signed   By: Deatra Robinson M.D.   On: 06/20/2023 23:34   CT Lumbar Spine Wo Contrast  Result Date: 06/20/2023 CLINICAL DATA:  Back trauma EXAM: CT LUMBAR SPINE WITHOUT CONTRAST TECHNIQUE: Multidetector CT imaging of the lumbar spine was performed without intravenous contrast administration. Multiplanar CT image reconstructions were also generated. RADIATION DOSE REDUCTION: This exam was performed according to the departmental dose-optimization program which includes automated exposure control, adjustment of the mA and/or kV according to patient size and/or use of iterative reconstruction technique. COMPARISON:  None Available. FINDINGS: Segmentation: 5 lumbar type vertebrae. Alignment: Normal. Vertebrae: No acute fracture or focal pathologic process. Paraspinal and other soft tissues: Aortic vascular calcification. No acute paravertebral or paraspinal soft tissue abnormality. Disc levels: At L1-L2, maintained disc space. No canal stenosis. Patent foramen. Mild facet degenerative changes. At L2-L3, mild disc space narrowing. Diffuse disc bulge. Hypertrophic facet degenerative changes with at least mild canal stenosis. Mild left inferior foraminal narrowing. At L2-L3, disc space narrowing and vacuum discs. Diffuse disc bulge. Ligamentum flavum thickening and mild facet degenerative  changes. Mild canal stenosis. Mild bilateral foraminal narrowing. At L3-L4, disc space narrowing and vacuum disc. Diffuse disc bulge. No canal stenosis. Facet degenerative changes. Mild to moderate right foraminal narrowing. At L5-S1, disc space narrowing and vacuum disc. Small central focal disc protrusion but no canal stenosis. Moderate facet degenerative changes. Mild to moderate right foraminal narrowing.  IMPRESSION: 1. No CT evidence for acute osseous abnormality. 2. Multilevel degenerative changes. 3. Aortic atherosclerosis. Aortic Atherosclerosis (ICD10-I70.0). Electronically Signed   By: Jasmine Pang M.D.   On: 06/20/2023 18:25   CT PELVIS WO CONTRAST  Result Date: 06/20/2023 CLINICAL DATA:  Trauma fall assess for pelvic fracture EXAM: CT PELVIS WITHOUT CONTRAST TECHNIQUE: Multidetector CT imaging of the pelvis was performed following the standard protocol without intravenous contrast. RADIATION DOSE REDUCTION: This exam was performed according to the departmental dose-optimization program which includes automated exposure control, adjustment of the mA and/or kV according to patient size and/or use of iterative reconstruction technique. COMPARISON:  None Available. FINDINGS: Urinary Tract:  No abnormality visualized. 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 No suspicious lymph nodes. Atherosclerosis Reproductive:  Prostate is slightly enlarged Other:  Negative for pelvic effusion Musculoskeletal: No acute osseous abnormality. Mild bilateral hip degenerative change IMPRESSION: 1. Negative for acute osseous abnormality. 2. 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 3. Slightly enlarged prostate. Electronically Signed   By: Jasmine Pang M.D.   On: 06/20/2023 18:20     Labs:   Basic Metabolic Panel: Recent Labs  Lab 06/18/23 0947 06/20/23 1721  NA 133* 134*  K 4.2 5.2*  CL 98 101  CO2 25 26  GLUCOSE 144* 102*  BUN 19 20  CREATININE 1.08 1.10  CALCIUM 9.0 8.6*  MG  --  2.3  PHOS  --  4.1   GFR Estimated Creatinine Clearance: 64.6 mL/min (by C-G formula based on SCr of 1.1 mg/dL). Liver Function Tests: Recent Labs  Lab 06/18/23 0947  AST 20  ALT 21  ALKPHOS 67  BILITOT 1.3*  PROT 6.7  ALBUMIN 3.8   No results for input(s): "LIPASE", "AMYLASE" in the last 168 hours. No results for input(s): "AMMONIA"  in the last 168 hours. Coagulation profile Recent Labs  Lab 06/18/23 0947  INR 1.1    CBC: Recent Labs  Lab 06/18/23 0947 06/20/23 1721  WBC 8.7 9.2  HGB 17.1* 16.8  HCT 50.5 49.0  MCV 90.3 90.1  PLT 254 293   Cardiac Enzymes: Recent Labs  Lab 06/20/23 1721  CKTOTAL 77   BNP: Invalid input(s): "POCBNP" CBG: No results for input(s): "GLUCAP" in the last 168 hours. D-Dimer No results for input(s): "DDIMER" in the last 72 hours. Hgb A1c No results for input(s): "HGBA1C" in the last 72 hours. Lipid Profile No results for input(s): "CHOL", "HDL", "LDLCALC", "TRIG", "CHOLHDL", "LDLDIRECT" in the last 72 hours. Thyroid function studies Recent Labs    06/20/23 1721  TSH 7.429*   Anemia work up No results for input(s): "VITAMINB12", "FOLATE", "FERRITIN", "TIBC", "IRON", "RETICCTPCT" in the last 72 hours. Microbiology No results found for this or any previous visit (from the past 240 hour(s)).   Discharge Instructions:   Discharge Instructions     Diet - low sodium heart healthy   Complete by: As directed    Discharge instructions   Complete by: As directed    Follow up with your primary care provider in one week. Take medications as prescribed. Check blood work at that time.  Seek medical attention for worsening symptoms.  Continue physical therapy at home.   Increase activity slowly   Complete by: As directed       Allergies as of 06/22/2023       Reactions   Ampicillin Rash        Medication List     STOP taking these medications    doxycycline 20 MG tablet Commonly known as: PERIOSTAT       TAKE these medications    methimazole 10 MG tablet Commonly known as: TAPAZOLE Take 10 mg by mouth every evening.   oxyCODONE-acetaminophen 5-325 MG tablet Commonly known as: PERCOCET/ROXICET Take 2 tablets by mouth every 6 (six) hours as needed for severe pain.   pregabalin 75 MG capsule Commonly known as: Lyrica Take 1 capsule (75 mg total) by  mouth 2 (two) times daily. What changed:  medication strength how much to take   prochlorperazine 10 MG tablet Commonly known as: COMPAZINE Take 1 tablet (10 mg total) by mouth 2 (two) times daily as needed (nausea, vomiting, headache).   rosuvastatin 20 MG tablet Commonly known as: CRESTOR TAKE 1 TABLET DAILY. SCHEDULE OFFICE VISIT FOR FUTURE REFILLS What changed: See the new instructions.               Durable Medical Equipment  (From admission, onward)           Start     Ordered   06/22/23 0913  For home use only DME Walker rolling  Once       Question Answer Comment  Walker: With 5 Inch Wheels   Patient needs a walker to treat with the following condition Ambulatory dysfunction      06/22/23 0912   06/22/23 0912  For home use only DME 3 n 1  Once        06/22/23 0912            Follow-up Information     Adrian Prince, MD Follow up in 1 week(s).   Specialty: Endocrinology Contact information: [address] [place] [address] [phone]                  Time coordinating discharge: 39 minutes  Signed:  Minaal Struckman  [facility] 06/22/2023, 3:25 PM

## 2023-06-24 ENCOUNTER — Encounter: Payer: Self-pay | Admitting: Neurology

## 2023-06-24 ENCOUNTER — Ambulatory Visit (INDEPENDENT_AMBULATORY_CARE_PROVIDER_SITE_OTHER): Payer: Medicare Other | Admitting: Neurology

## 2023-06-24 VITALS — BP 103/66 | HR 73 | Ht 73.0 in | Wt 197.2 lb

## 2023-06-24 DIAGNOSIS — G3184 Mild cognitive impairment, so stated: Secondary | ICD-10-CM

## 2023-06-24 DIAGNOSIS — I482 Chronic atrial fibrillation, unspecified: Secondary | ICD-10-CM | POA: Diagnosis not present

## 2023-06-24 DIAGNOSIS — I610 Nontraumatic intracerebral hemorrhage in hemisphere, subcortical: Secondary | ICD-10-CM | POA: Diagnosis not present

## 2023-06-24 MED ORDER — ASPIRIN 81 MG PO TBEC: 81.0000 mg | DELAYED_RELEASE_TABLET | Freq: Every day | ORAL | 12 refills | Status: DC

## 2023-06-24 NOTE — Progress Notes (Signed)
[facility] [address] [place]. [place] [address] [phone]       OFFICE CONSULT NOTE  [name] Date of Birth:  05/06/1946 Medical Record Number:  [id number]   Referring MD:  Armida Sans, NP  Reason for Referral:  ICH  HPI: Kyle [name] is a pleasant 77 year old Caucasian male seen today for initial office consultation visit for intracerebral hemorrhage.  History is obtained from the patient and his wife and 2 daughters and son who accompany him and review of electronic medical records.  I personally reviewed pertinent available imaging films and PACS.  He has past medical history of chronic atrial fibrillation on Eliquis, hypothyroidism, migraines, gastroesophageal flux disease and rosacea.  He presented on 06/11/2023 to [facility] emergency room with a 2-day history of headache and lethargy.  Neurological exam was quite nonfocal however CT scan of the head showed a early subacute large right basal ganglia and basal for brain parenchymal hemorrhage without significant intraventricular extension.  There is mild cytotoxic edema but no significant midline shift.  CT angiogram of the head and neck showed no spot sign or large vessel stenosis.  Patient was on Eliquis and this was reversed with Andexxa.  Blood pressures were adequately controlled.  Patient was transferred to [facility] and admitted to the neurological intensive care unit where he was closely monitored.  He remained stable and was seen by physical Occupational Therapy and felt to require only outpatient therapy needs.  MRI scan showed stable right basal ganglia hemorrhage with 7 mm mid right-to-left shift with slight layering of the hemorrhage in the occipital horns.  2D echo showed ejection fraction 55 to 60%.  Left atrial size was severely dilated.  LDL cholesterol was 35 mg percent.  Hemoglobin A1c was 6.0.  Patient was advised to stop his anticoagulation and alternatives like Watchman  device versus participation in the aspire trial resuming aspirin or Eliquis in 2 to 4 weeks were also discussed.  Patient however returned to the emergency room the day of discharge with severe headache and repeat CT scan of the head was obtained which showed no acute abnormality and he was sent home.  He returned to the emergency room on 06/18/2023 for a fall with a nonfocal exam.  He stated he fell and struck his head and left side of the face.  He was admitted for a few days for deconditioning as he was unable to get up by himself and wife could not care for him.  CT scan of the head showed no acute findings except evolutionary changes.  CT scan of the lumbar spine and pelvis show degenerative changes with moderate neuroforaminal narrowing and mild central canal stenosis.  MRI lumbar spine showed mild right L4-5 and L5-S1 foraminal stenosis and bilateral lateral recess stenosis.  He was recommended Tylenol and Lyrica for his pain.  He was discharged home with home physical Occupational Therapy which started yesterday.  He is now able to ambulate with a walker.  He has had no further falls.  Patient has longstanding atrial fibrillation and was previously on warfarin and has been on Eliquis for last several years.  Patient does state that he has noticed that his cognitive thinking has slowed down a bit since his brain hemorrhage.  He is careful with his walking and uses a walker.  ROS:   14 system review of systems is positive for balance and walking difficulties, memory difficulties all other systems negative  PMH:  Past  Medical History:  Diagnosis Date   Atrial fibrillation (HCC)    History of kidney stones 10-23-2011  SPONTANEOUSLY PASSED   History of migraine    Hyperthyroidism NO MEDS OR TX   MONITORED BY DR SOUTH   Mild acid reflux OCCASIONAL-  WATCHES DIET   PONV (postoperative nausea and vomiting)    Rosacea     Social History:  Social History   Socioeconomic History   Marital status:  Married    Spouse name: Not on file   Number of children: 4   Years of education: Not on file   Highest education level: Not on file  Occupational History   Occupation: retirement Copywriter, advertising: [employer]  Tobacco Use   Smoking status: Never   Smokeless tobacco: Never  Vaping Use   Vaping status: Never Used  Substance and Sexual Activity   Alcohol use: Yes    Alcohol/week: 3.0 - 4.0 standard drinks of alcohol    Types: 3 - 4 Glasses of wine per week    Comment: occasional   Drug use: No   Sexual activity: Not on file  Other Topics Concern   Not on file  Social History Narrative   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 He does not use cigarettes or recreational drugs. He does use alcohol occasionally.   Social Determinants of Health   Financial Resource Strain: Not on file  Food Insecurity: No Food Insecurity (06/21/2023)   Hunger Vital Sign    Worried About Running Out of Food in the Last Year: Never true    Ran Out of Food in the Last Year: Never true  Transportation Needs: No Transportation Needs (06/21/2023)   PRAPARE - Administrator, Civil Service (Medical): No    Lack of Transportation (Non-Medical): No  Physical Activity: Not on file  Stress: Not on file  Social Connections: Not on file  Intimate Partner Violence: Not At Risk (06/21/2023)   Humiliation, Afraid, Rape, and Kick questionnaire    Fear of Current or Ex-Partner: No    Emotionally Abused: No    Physically Abused: No    Sexually Abused: No    Medications:   Current Outpatient Medications on File Prior to Visit  Medication Sig Dispense Refill   methimazole (TAPAZOLE) 10 MG tablet Take 10 mg by mouth every evening.     oxyCODONE-acetaminophen (PERCOCET/ROXICET) 5-325 MG tablet Take 2 tablets by mouth every 6 (six) hours as needed for severe pain. 20 tablet 0   pregabalin (LYRICA) 75 MG capsule Take 1 capsule (75 mg total) by mouth 2 (two) times  daily. 60 capsule 2   prochlorperazine (COMPAZINE) 10 MG tablet Take 1 tablet (10 mg total) by mouth 2 (two) times daily as needed (nausea, vomiting, headache). 30 tablet 0   rosuvastatin (CRESTOR) 20 MG tablet TAKE 1 TABLET DAILY. SCHEDULE OFFICE VISIT FOR FUTURE REFILLS (Patient taking differently: Take 20 mg by mouth every evening.) 90 tablet 2   No current facility-administered medications on file prior to visit.    Allergies:   Allergies  Allergen Reactions   Ampicillin Rash    Physical Exam General: well developed, well nourished pleasant elderly Caucasian male, seated, in no evident distress Head: head normocephalic and atraumatic.   Neck: supple with no carotid or supraclavicular bruits Cardiovascular: regular rate and rhythm, no murmurs Musculoskeletal: no deformity Skin:  no rash/petichiae Vascular:  Normal pulses all extremities  Neurologic Exam Mental Status: Awake  and fully alert. Oriented to place and time. Recent and remote memory diminished.. Attention span, concentration and fund of knowledge appropriate. Mood and affect appropriate.  Diminished recall 2/3.  Able to name only 8 animals which can walk on 4 legs.  Clock drawing 4/4. Cranial Nerves: Fundoscopic exam not done. Pupils equal, briskly reactive to light. Extraocular movements full without nystagmus. Visual fields full to confrontation. Hearing intact. Facial sensation intact. Face, tongue, palate moves normally and symmetrically.  Motor: Normal bulk and tone. Normal strength in all tested extremity muscles. Sensory.: intact to touch , pinprick , position and vibratory sensation.  Coordination: Rapid alternating movements normal in all extremities. Finger-to-nose and heel-to-shin performed accurately bilaterally. Gait and Station: Arises from chair without difficulty. Stance is normal. Gait slightly broad-based and uses a walker.  Unable to do tandem walking.   Reflexes: 1+ and symmetric. Toes downgoing.    NIHSS  1 Modified Rankin  2      ASSESSMENT: 77 year old Caucasian male with right basal ganglia hemorrhage and September 2024 secondary to anticoagulation with Eliquis for chronic atrial fibrillation.  He is doing reasonably well with only mild cognitive and gait balance difficulties.     PLAN:I had a  quite long discussion the patient, his wife, son and 2 daughters about his recent intracerebral hemorrhage related to anticoagulation with Eliquis for his chronic atrial fibrillation.  He is doing reasonably well with only mild cognitive and gait and balance difficulties.  We discussed alternatives to anticoagulation including Watchman device, resuming anticoagulation or aspirin or participation in the ASPIRE  (aspirin versus Eliquis following intracerebral hemorrhage ) trial.  I recommend starting aspirin 81 mg daily now for stroke prevention and referral to cardiac electrophysiology physiology to discuss Watchman device before patient makes a final decision.  Maintain aggressive risk factor modification with strict control of hypertension with blood pressure goal below 130/90, lipids with LDL cholesterol goal below 70 mg percent, diabetes with hemoglobin A1c goal below 6.5%.  He also was advised to use his walker at all times and we discussed fall safety precautions.  We also discussed his mild cognitive impairment and I encouraged him to increase participation in cognitively challenging activities like solving crossword he will return for follow-up in the future with me in 4 months or call earlier if necessary. Greater than 50% time during this prolonged 60-minute consultation visit was spent on counseling and coordination of care about his intracerebral hemorrhage and discussion about risk-benefit of resuming anticoagulation as well as alternatives to anticoagulation and answering questions with the patient and his family. Delia Heady, MD Note: This document was prepared with digital dictation  and possible smart phrase technology. Any transcriptional errors that result from this process are unintentional.

## 2023-06-24 NOTE — Patient Instructions (Addendum)
I had a long discussion the patient, his wife, son and 2 daughters about his recent intracerebral hemorrhage related to anticoagulation with Eliquis for his chronic atrial fibrillation.  He is doing reasonably well with only mild cognitive and gait and balance difficulties.  We discussed alternatives to anticoagulation including Watchman device, resuming anticoagulation or aspirin or participation in the ASPIRE  (aspirin versus Eliquis following intracerebral hemorrhage ) trial.  I recommend starting aspirin 81 mg daily now for stroke prevention and referral to cardiac electrophysiology physiology to discuss Watchman device before patient makes a final decision.  Maintain aggressive risk factor modification with strict control of hypertension with blood pressure goal below 130/90, lipids with LDL cholesterol goal below 70 mg percent, diabetes with hemoglobin A1c goal below 6.5%.  He also was advised to use his walker at all times and we discussed fall safety precautions.  We also discussed his mild cognitive impairment and I encouraged him to increase participation in cognitively challenging activities like solving crossword he will return for follow-up in the future with me in 4 months or call earlier if necessary.  Fall Prevention in Hospitals, Adult Staying in the hospital puts you at risk of falling. Falls can cause serious injuries, but they can be prevented. Make sure you know what puts you at risk for falling and what you and your health care team can do to prevent falls. If you or a loved one falls in the hospital, tell the hospital staff about it. What can increase my risk of falls? Factors that increase your risk of falling in the hospital include: Being in an unfamiliar environment, especially when using the bathroom at night. Having surgery or being on bed rest. Taking many medicines or certain types of medicines, such as sleeping pills. Some medicines can cause confusion, trouble with balance,  dizziness, or low blood pressure. Having tubes in place, such as IVs or catheters. Other risk factors for falls while in the hospital include: Having trouble with hearing or vision. Having depression. Needing to use the toilet frequently. Having fallen during the past 3 months. What actions can I take to prevent falls? If you or a loved one has to stay in the hospital: Ask about which fall prevention strategies will be in place. Do not get up by yourself if you have been asked to call for help when getting up. Asking for help to get up is for your safety, and the staff is there to help you. Wear non-skid shoes or non-skid slippers. Get up slowly, and sit at the side of the bed for a few minutes before standing up. Keep items you need close to you, such as the call button or a phone, so that you do not need to reach for them. Wear eyeglasses or hearing aids as told by your health care provider. Have someone stay in the hospital with you or your loved one. Ask if sleeping pills or other medicines that can cause confusion or dizziness are necessary if they are prescribed to you or a loved one. What does the hospital staff do to help prevent falls?     Hospitals have systems in place to prevent falls and accidents, which may include: Discussing your fall risk and making a personalized fall prevention plan. Checking in regularly to see if you need help. Some hospitals use video monitoring that allows a staff member to come to you if you need help. Placing an armband on your wrist or a sign near your room to  alert other staff of your needs. Using an alarm on your hospital bed. This is an alarm that goes off if you get out of bed and forget to call for help. Keeping the bed in a low and locked position. Keeping the area around the bed and bathroom well-lit and not cluttered. Having a staff person stay with you (one-on-one observation), even when you are using the bathroom. This is for your  safety. Using safety equipment, such as: A belt around your waist. Walkers, crutches, and other devices for support. Safety beds, such as low beds, or cushions on the floor next to the bed. What other actions can I take to prevent falls? Check in regularly with your provider or pharmacist to review all medicines that you take. Make sure that you have a regular exercise program to stay physically fit. This will help you maintain your balance. Talk with a physical therapist if recommended by your provider. A physical therapist can help you learn to do exercises to improve movement and strength. If you are over 65 years old: Ask your provider if you need a calcium or vitamin D supplement. Have your eyes and hearing checked every year. Have your feet checked every year. This information is not intended to replace advice given to you by your health care provider. Make sure you discuss any questions you have with your health care provider. Document Revised: 05/13/2022 Document Reviewed: 05/13/2022 Elsevier Patient Education  2024 [employer].

## 2023-06-25 ENCOUNTER — Telehealth: Payer: Self-pay | Admitting: Cardiology

## 2023-06-25 NOTE — Telephone Encounter (Signed)
Spoke with wife per DPR and she states over the last two weeks patient has been in ED for a small brain bleed that he had. He saw neuro yesterday and she states they stopped his Eliquis and switched him to a baby aspirin. She would like for you to review his notes from his recent visit and give your input on the watchman before he meets with Dr. Lalla Brothers.

## 2023-06-25 NOTE — Telephone Encounter (Signed)
Spoke with pt and wife, Aware of dr Ludwig Clarks recommendations. All questions answered to the best of my ability.

## 2023-06-25 NOTE — Telephone Encounter (Signed)
Pt's spouse called regarding her wanting pt to be seen due to him having a brain bleed 2 weeks ago, he was in NICU and then when he was sent home he then had to go back to ED a few times. It's been discussed for him to have a watchman as well but she called to get an urgent appt with Dr. Jens Som but when I offered the soonest available appt she declined it. When offered to see a NP or PA she also declined and requested to speak with a nurse. Please advise

## 2023-06-26 ENCOUNTER — Telehealth: Payer: Self-pay

## 2023-06-26 NOTE — Telephone Encounter (Signed)
Received message from EP scheduler that the patient's wife has questions about LAAO.  Called at spoke with the patient and his wife at length. Reviewed Watchman work up, procedure, and post procedure plan in great detail. Discussed possible medication regimens pre/post procedure.  They understand a plan will be made with Dr. Lalla Brothers at their consult later this month.  They were grateful for call and agreed with plan.

## 2023-06-27 ENCOUNTER — Ambulatory Visit: Payer: Medicare Other

## 2023-06-27 ENCOUNTER — Ambulatory Visit: Payer: Medicare Other | Admitting: Occupational Therapy

## 2023-06-27 ENCOUNTER — Ambulatory Visit: Payer: Medicare Other | Admitting: Physical Therapy

## 2023-07-06 NOTE — Progress Notes (Unsigned)
Electrophysiology Office Note:    Date:  07/07/2023   ID:  Kyle [name], DOB 1946-02-07, MRN [id number]  [facility] Cardiologist:  Olga Millers, MD  [facility] Electrophysiologist:  Lanier Prude, MD   Referring MD: Micki Riley, MD   Chief Complaint: Atrial fibrillation  History of Present Illness:    Kyle [name] is a 77 y.o. malewho I am seeing today for an evaluation of atrial fibrillation at the request of Dr. Pearlean Brownie.  The patient was last seen by Dr. Pearlean Brownie on June 24, 2023.  The patient has a medical history that includes intracranial hemorrhage, chronic atrial fibrillation, hypothyroidism, GERD.  He was seen in the emergency department June 11, 2023 with headache and lethargy.  CT scan of the brain showed an early subacute right sided basal ganglia parenchymal hemorrhage without intraventricular extension.  His Eliquis was reversed with Andexxa and he was admitted to the neuro ICU.  He subsequently stabilized and was sent home.  He returned to the emergency department after a fall at home.  He continues to slowly improved.  He was ambulating with a walker at the time of his appointment with Dr. Pearlean Brownie.  At the appointment with Dr. Pearlean Brownie, aspirin was restarted and restarting anticoagulation was also discussed.  He was referred for consideration of watchman.  He is doing well with his recovery.  Slow but steady progress.  Wants to restart exercise at [employer].  Is a golfer and is interested in restarting that.  He is tolerating aspirin without known bleeding issues.        Their past medical, social and family history was reveiwed.   ROS:   Please see the history of present illness.    All other systems reviewed and are negative.  EKGs/Labs/Other Studies Reviewed:    The following studies were reviewed today:  June 12, 2023 echo EF 55-60 RV normal Severely dilated left atrium Trivial MR       Physical Exam:    VS:  BP 118/70    Pulse 61   Ht 6\' 1"  (1.854 m)   Wt 200 lb 6.4 oz (90.9 kg)   SpO2 97%   BMI 26.44 kg/m     Wt Readings from Last 3 Encounters:  07/07/23 200 lb 6.4 oz (90.9 kg)  06/24/23 197 lb 3.2 oz (89.4 kg)  06/20/23 205 lb (93 kg)     GEN:  Well nourished, well developed in no acute distress CARDIAC: RRR, no murmurs, rubs, gallops RESPIRATORY:  Clear to auscultation without rales, wheezing or rhonchi       ASSESSMENT AND PLAN:    1. Permanent atrial fibrillation (HCC)   2. Nontraumatic subcortical hemorrhage of right cerebral hemisphere [facility])     #Atrial fibrillation #History of intracranial hemorrhage Discussed stroke risk mitigation strategies with the patient during today's clinic appointment.  We discussed resuming anticoagulation, continuing with antiplatelet therapy and left atrial appendage occlusion.  We discussed the need for short-term anticoagulation around the time of left atrial appendage occlusion procedure.  I discussed the risks associated with restarting anticoagulation including repeat intracranial hemorrhage.  -----------  I have seen Kyle [name] in the office today who is being considered for a Watchman left atrial appendage closure device. I believe they will benefit from this procedure given their history of atrial fibrillation, CHA2DS2-VASc score of 3 and unadjusted ischemic stroke rate of 3.2% per year. Unfortunately, the patient is not felt to be a long term anticoagulation candidate secondary  to intracranial hemorrhage. The patient's chart has been reviewed and I feel that they would be a candidate for short term oral anticoagulation after Watchman implant.   It is my belief that after undergoing a LAA closure procedure, Kyle [name] will not need long term anticoagulation which eliminates anticoagulation side effects and major bleeding risk.   Procedural risks for the Watchman implant have been reviewed with the patient including a 0.5% risk of stroke, <1% risk of  perforation and <1% risk of device embolization. Other risks include bleeding, vascular damage, tamponade, worsening renal function, and death. The patient understands these risk and wishes to proceed.     The published clinical data on the safety and effectiveness of WATCHMAN include but are not limited to the following: - Holmes DR, Everlene Farrier, Sick P et al. for the PROTECT AF Investigators. Percutaneous closure of the left atrial appendage versus warfarin therapy for prevention of stroke in patients with atrial fibrillation: a randomised non-inferiority trial. Lancet 2009; 374: 534-42. Everlene Farrier, Doshi SK, Isa Rankin D et al. on behalf of the PROTECT AF Investigators. Percutaneous Left Atrial Appendage Closure for Stroke Prophylaxis in Patients With Atrial Fibrillation 2.3-Year Follow-up of the PROTECT AF (Watchman Left Atrial Appendage System for Embolic Protection in Patients With Atrial Fibrillation) Trial. Circulation 2013; 127:720-729. - Alli O, Doshi S,  Kar S, Reddy VY, Sievert H et al. Quality of Life Assessment in the Randomized PROTECT AF (Percutaneous Closure of the Left Atrial Appendage Versus Warfarin Therapy for Prevention of Stroke in Patients With Atrial Fibrillation) Trial of Patients at Risk for Stroke With Nonvalvular Atrial Fibrillation. J Am Coll Cardiol 2013; 61:1790-8. Aline August DR, Mia [place], Price M, Whisenant B, Sievert H, Doshi S, Huber K, Reddy V. Prospective randomized evaluation of the Watchman left atrial appendage Device in patients with atrial fibrillation versus long-term warfarin therapy; the PREVAIL trial. Journal of the [employer] of Cardiology, Vol. 4, No. 1, 2014, 1-11. - Kar S, Doshi SK, Sadhu A, Horton R, Osorio J et al. Primary outcome evaluation of a next-generation left atrial appendage closure device: results from the PINNACLE FLX trial. Circulation 2021;143(18)1754-1762.    After today's visit with the patient which was dedicated solely for shared  decision making visit regarding LAA closure device, the patient decided to proceed with the LAA appendage closure procedure scheduled to be done in the near future at [facility]. Prior to the procedure, I would like to obtain a gated CT scan of the chest with contrast timed for PV/LA visualization.   HAS-BLED score 2 Hypertension No  Abnormal renal and liver function (Dialysis, transplant, Cr >2.26 mg/dL /Cirrhosis or Bilirubin >2x Normal or AST/ALT/AP >3x Normal) No  Stroke No  Bleeding Yes  Labile INR (Unstable/high INR) No  Elderly (>65) Yes  Drugs or alcohol (>= 8 drinks/week, anti-plt or NSAID) No   CHA2DS2-VASc Score = 3  The patient's score is based upon: CHF History: 0 HTN History: 0 Diabetes History: 0 Stroke History: 0 Vascular Disease History: 1 Age Score: 2 Gender Score: 0    Signed, Sheria Lang T. Lalla Brothers, MD, [facility], [facility] 07/07/2023 11:34 AM    Electrophysiology  [facility]

## 2023-07-07 ENCOUNTER — Encounter: Payer: Self-pay | Admitting: Cardiology

## 2023-07-07 ENCOUNTER — Ambulatory Visit: Payer: Medicare Other | Attending: Cardiology | Admitting: Cardiology

## 2023-07-07 VITALS — BP 118/70 | HR 61 | Ht 73.0 in | Wt 200.4 lb

## 2023-07-07 DIAGNOSIS — I4821 Permanent atrial fibrillation: Secondary | ICD-10-CM

## 2023-07-07 DIAGNOSIS — I610 Nontraumatic intracerebral hemorrhage in hemisphere, subcortical: Secondary | ICD-10-CM

## 2023-07-07 NOTE — Patient Instructions (Addendum)
Medication Instructions:  Your physician recommends that you continue on your current medications as directed. Please refer to the Current Medication list given to you today.  *If you need a refill on your cardiac medications before your next appointment, please call your pharmacy*  Testing/Procedures: Your physician has requested that you have Left atrial appendage (LAA) closure device implantation is a procedure to put a small device in the LAA of the heart. The LAA is a small sac in the wall of the heart's left upper chamber. Blood clots can form in this area. The device, Watchman closes the LAA to help prevent a blood clot and stroke.   Follow-Up: At [facility], you and your health needs are our priority.  As part of our continuing mission to provide you with exceptional heart care, we have created designated Provider Care Teams.  These Care Teams include your primary Cardiologist (physician) and Advanced Practice Providers (APPs -  Physician Assistants and Nurse Practitioners) who all work together to provide you with the care you need, when you need it.  Your next appointment:   If you decide that you would like to proceed with Watchman device please contact Nurse Navigator, Katy at [phone].

## 2023-07-11 ENCOUNTER — Encounter: Payer: Self-pay | Admitting: Cardiology

## 2023-07-11 NOTE — Telephone Encounter (Signed)
Recommended pt to go ahead and follow up with Samara Deist the device coordinator to see if he is a good fit for the device but patient does not want to schedule appt until Dr. Jens Som weighs in.

## 2023-07-14 ENCOUNTER — Telehealth: Payer: Self-pay

## 2023-07-14 DIAGNOSIS — I610 Nontraumatic intracerebral hemorrhage in hemisphere, subcortical: Secondary | ICD-10-CM

## 2023-07-14 DIAGNOSIS — I4821 Permanent atrial fibrillation: Secondary | ICD-10-CM

## 2023-07-14 NOTE — Telephone Encounter (Signed)
The patient requested to schedule his pre-LAAO CT after speaking with Dr. Ludwig Clarks team.  Scheduled the patient for CT scan 07/24/2023.  Per his request, will call tomorrow and review instructions with his wife and him.

## 2023-07-15 NOTE — Telephone Encounter (Signed)
Called to confirm appointments as requested.  Left message to call back.

## 2023-07-16 NOTE — Telephone Encounter (Signed)
The patient's CT instructions are marked as "read" 07/15/2023.  With my direct phone number given in MyChart message and in voicemail, the patient will call back if he has questions or concerns.

## 2023-07-24 ENCOUNTER — Ambulatory Visit (HOSPITAL_COMMUNITY)
Admission: RE | Admit: 2023-07-24 | Discharge: 2023-07-24 | Disposition: A | Discharge status: Home / Self Care | Payer: Medicare Other | Source: Ambulatory Visit | Attending: Cardiology | Admitting: Cardiology

## 2023-07-24 DIAGNOSIS — I4821 Permanent atrial fibrillation: Secondary | ICD-10-CM

## 2023-07-24 DIAGNOSIS — I610 Nontraumatic intracerebral hemorrhage in hemisphere, subcortical: Secondary | ICD-10-CM

## 2023-07-24 MED ORDER — IOHEXOL 350 MG/ML SOLN
100.0000 mL | Freq: Once | INTRAVENOUS | Status: AC | PRN
  Administered 2023-07-24: 100 mL via INTRAVENOUS

## 2023-07-29 ENCOUNTER — Encounter: Payer: Self-pay | Admitting: Cardiology

## 2023-07-29 ENCOUNTER — Telehealth: Payer: Self-pay

## 2023-07-29 NOTE — Telephone Encounter (Addendum)
The patient wishes to proceed with LAAO on 10/30/2023. Pre-procedure visit scheduled 10/06/2023. The patient was grateful for call and agreed with plan.   Will send to Dr. Lalla Brothers to confirm a/c strategy prior to procedure as the patient is currently on ASA 81 mg daily with history of ICH. Will start appropriate medication therapy at pre-procedure visit.

## 2023-07-29 NOTE — Telephone Encounter (Signed)
-----   Message from Rossie Muskrat [facility] sent at 07/27/2023  8:03 PM EST ----- OK to proceed with watchman evaluation.  Sheria Lang T. Lalla Brothers, MD, [facility], [facility] Cardiac Electrophysiology

## 2023-08-04 ENCOUNTER — Telehealth: Payer: Self-pay

## 2023-08-04 NOTE — Telephone Encounter (Signed)
[employer] Watchman TruPlan report for 07/24/2023 cCT:  Kyle [name]: Large chicken wing with delayed distal filling in best systolic views. Delayed images are ok. Max 26/ AVG 24/ Depth 15.2 Just enough depth for a 31 mm device with possible small shoulder. TruSteer sheath may allow a better angle to access a few mm of added depth. Mid/Mid TSP  RAO 31 CAU 33

## 2023-08-08 NOTE — Telephone Encounter (Signed)
Spoke with patient about schedule change for pre-op LAAO office visit at which time the patient and wife were offered an appointment with Dr. Lalla Brothers 12/19/204 to clarify final antiplatelet plan given the ongoing discussion with Dr. Pearlean Brownie. They agree this will be the best plan. Confirmed appointment 09/11/2023 at 130pm with Dr. Lalla Brothers and pre-op visit 10/07/2023 at 0905.   Georgie Chard NP-C Structural Heart Team  Phone: [phone]

## 2023-09-10 NOTE — Progress Notes (Unsigned)
Electrophysiology Office Follow up Visit Note:    Date:  09/11/2023   ID:  Kyle [name], DOB 1946-01-10, MRN [id number]  PCP:  Adrian Prince, MD  [facility] Cardiologist:  Olga Millers, MD  [facility] Electrophysiologist:  Lanier Prude, MD    Interval History:     Kyle [name] is a 77 y.o. male who presents for a follow up visit.  Last saw the patient July 07, 2023 for his atrial fibrillation and history of intracranial hemorrhage.  We discussed using a left atrial appendage occlusion device as a stroke risk mitigation strategy.  He is currently scheduled for watchman implant on October 30, 2023.         Past medical, surgical, social and family history were reviewed.  ROS:   Please see the history of present illness.    All other systems reviewed and are negative.  EKGs/Labs/Other Studies Reviewed:    The following studies were reviewed today:          Physical Exam:    VS:  BP 112/72 (BP Location: Left Arm, Patient Position: Sitting)   Pulse 75   Ht 6\' 1"  (1.854 m)   Wt 211 lb 6.4 oz (95.9 kg)   SpO2 97%   BMI 27.89 kg/m     Wt Readings from Last 3 Encounters:  09/11/23 211 lb 6.4 oz (95.9 kg)  07/07/23 200 lb 6.4 oz (90.9 kg)  06/24/23 197 lb 3.2 oz (89.4 kg)     GEN: no distress CARD: RRR, No MRG RESP: No IWOB. CTAB.      ASSESSMENT:    1. Permanent atrial fibrillation (HCC)   2. Nontraumatic subcortical hemorrhage of right cerebral hemisphere [facility])    PLAN:    In order of problems listed above:     #Atrial fibrillation #History of intracranial hemorrhage Discussed stroke risk mitigation strategies with the patient during today's clinic appointment.  We discussed resuming anticoagulation, continuing with antiplatelet therapy and left atrial appendage occlusion.  We discussed the need for short-term anticoagulation around the time of left atrial appendage occlusion procedure.  I discussed the risks associated with  restarting anticoagulation including repeat intracranial hemorrhage.   -----------   I have seen Kyle [name] in the office today who is being considered for a Watchman left atrial appendage closure device. I believe they will benefit from this procedure given their history of atrial fibrillation, CHA2DS2-VASc score of 3 and unadjusted ischemic stroke rate of 3.2% per year. Unfortunately, the patient is not felt to be a long term anticoagulation candidate secondary to intracranial hemorrhage. The patient's chart has been reviewed and I feel that they would be a candidate for short term oral anticoagulation after Watchman implant.    It is my belief that after undergoing a LAA closure procedure, Kyle [name] will not need long term anticoagulation which eliminates anticoagulation side effects and major bleeding risk.    Procedural risks for the Watchman implant have been reviewed with the patient including a 0.5% risk of stroke, <1% risk of perforation and <1% risk of device embolization. Other risks include bleeding, vascular damage, tamponade, worsening renal function, and death. The patient understands these risk and wishes to proceed.       The published clinical data on the safety and effectiveness of WATCHMAN include but are not limited to the following: - Holmes DR, Everlene Farrier, Sick P et al. for the PROTECT AF Investigators. Percutaneous closure of the left atrial appendage versus warfarin  therapy for prevention of stroke in patients with atrial fibrillation: a randomised non-inferiority trial. Lancet 2009; 374: 534-42. Everlene Farrier, Doshi SK, Isa Rankin D et al. on behalf of the PROTECT AF Investigators. Percutaneous Left Atrial Appendage Closure for Stroke Prophylaxis in Patients With Atrial Fibrillation 2.3-Year Follow-up of the PROTECT AF (Watchman Left Atrial Appendage System for Embolic Protection in Patients With Atrial Fibrillation) Trial. Circulation 2013; 127:720-729. - Alli O, Doshi  S,  Kar S, Reddy VY, Sievert H et al. Quality of Life Assessment in the Randomized PROTECT AF (Percutaneous Closure of the Left Atrial Appendage Versus Warfarin Therapy for Prevention of Stroke in Patients With Atrial Fibrillation) Trial of Patients at Risk for Stroke With Nonvalvular Atrial Fibrillation. J Am Coll Cardiol 2013; 61:1790-8. Aline August DR, Mia [place], Price M, Whisenant B, Sievert H, Doshi S, Huber K, Reddy V. Prospective randomized evaluation of the Watchman left atrial appendage Device in patients with atrial fibrillation versus long-term warfarin therapy; the PREVAIL trial. Journal of the [employer] of Cardiology, Vol. 4, No. 1, 2014, 1-11. - Kar S, Doshi SK, Sadhu A, Horton R, Osorio J et al. Primary outcome evaluation of a next-generation left atrial appendage closure device: results from the PINNACLE FLX trial. Circulation 2021;143(18)1754-1762.      After today's visit with the patient which was dedicated solely for shared decision making visit regarding LAA closure device, the patient decided to proceed with the LAA appendage closure procedure scheduled to be done in the near future at [facility]. Prior to the procedure, I would like to obtain a gated CT scan of the chest with contrast timed for PV/LA visualization.    HAS-BLED score 2 Hypertension No  Abnormal renal and liver function (Dialysis, transplant, Cr >2.26 mg/dL /Cirrhosis or Bilirubin >2x Normal or AST/ALT/AP >3x Normal) No  Stroke No  Bleeding Yes  Labile INR (Unstable/high INR) No  Elderly (>65) Yes  Drugs or alcohol (>= 8 drinks/week, anti-plt or NSAID) No    CHA2DS2-VASc Score = 3  The patient's score is based upon: CHF History: 0 HTN History: 0 Diabetes History: 0 Stroke History: 0 Vascular Disease History: 1 Age Score: 2 Gender Score: 0   He will start Plavix 1 week prior to the Watchman procedure and continue his aspirin uninterrupted.  Will plan to continue DAPT for 6 months following  implant.   Signed, Steffanie Dunn, MD, [facility], [facility] 09/11/2023 7:53 PM    Electrophysiology [facility]

## 2023-09-11 ENCOUNTER — Ambulatory Visit: Payer: Medicare Other | Attending: Cardiology | Admitting: Cardiology

## 2023-09-11 ENCOUNTER — Encounter: Payer: Self-pay | Admitting: Cardiology

## 2023-09-11 VITALS — BP 112/72 | HR 75 | Ht 73.0 in | Wt 211.4 lb

## 2023-09-11 DIAGNOSIS — I610 Nontraumatic intracerebral hemorrhage in hemisphere, subcortical: Secondary | ICD-10-CM | POA: Insufficient documentation

## 2023-09-11 DIAGNOSIS — I4821 Permanent atrial fibrillation: Secondary | ICD-10-CM | POA: Diagnosis present

## 2023-09-11 NOTE — Patient Instructions (Signed)
Medication Instructions:  Your physician recommends that you continue on your current medications as directed. Please refer to the Current Medication list given to you today.  *If you need a refill on your cardiac medications before your next appointment, please call your pharmacy   Follow-Up: At [facility], you and your health needs are our priority.  As part of our continuing mission to provide you with exceptional heart care, we have created designated Provider Care Teams.  These Care Teams include your primary Cardiologist (physician) and Advanced Practice Providers (APPs -  Physician Assistants and Nurse Practitioners) who all work together to provide you with the care you need, when you need it.  Your next appointment:   As scheduled  

## 2023-09-15 NOTE — Progress Notes (Signed)
 HPI: FU atrial fibrillation. Exercise treadmill in 2007 negative. Abdominal CT January 2013 showed coronary artery calcification but no abdominal aortic aneurysm. Holter monitor March 2018 showed atrial fibrillation with PVCs or aberrantly conducted beats with slow rate. Cardizem  was discontinued.  Patient had intracranial hemorrhage September 2024.  He was seen by Dr. Cindie and is scheduled for watchman.  Echocardiogram September 2024 showed normal LV function, mild right ventricular enlargement, biatrial enlargement.  CTA October 2024 showed no left atrial appendage thrombus, calcium  score 2444 which was 91st percentile.  Since he was last seen patient denies dyspnea, chest pain, palpitations or syncope.  Current Outpatient Medications  Medication Sig Dispense Refill   aspirin  EC 81 MG tablet Take 1 tablet (81 mg total) by mouth daily. Swallow whole. 30 tablet 12   methimazole  (TAPAZOLE ) 10 MG tablet Take 10 mg by mouth every evening.     rosuvastatin  (CRESTOR ) 20 MG tablet TAKE 1 TABLET DAILY. SCHEDULE OFFICE VISIT FOR FUTURE REFILLS (Patient taking differently: Take 20 mg by mouth every evening.) 90 tablet 2   pregabalin  (LYRICA ) 75 MG capsule Take 1 capsule (75 mg total) by mouth 2 (two) times daily. (Patient not taking: Reported on 09/29/2023) 60 capsule 2   No current facility-administered medications for this visit.     Past Medical History:  Diagnosis Date   Atrial fibrillation (HCC)    History of kidney stones 10-23-2011  SPONTANEOUSLY PASSED   History of migraine    Hyperthyroidism NO MEDS OR TX   MONITORED BY DR SOUTH   Mild acid reflux OCCASIONAL-  WATCHES DIET   PONV (postoperative nausea and vomiting)    Rosacea     Past Surgical History:  Procedure Laterality Date   CARDIOVERSION  10-15-2007  DR MORRIS   NEW ON-SET A-FIB   ELBOW SURGERY     right   ELBOW SURGERY     KNEE ARTHROSCOPY WITH LATERAL MENISECTOMY  08/07/2012   Procedure: KNEE ARTHROSCOPY WITH  LATERAL MENISECTOMY;  Surgeon: Lynwood SHAUNNA Bern, MD;  Location: [facility];  Service: Orthopedics;  Laterality: Left;  Left Knee Arthroscopy with Partial Lateral Menisectomy    KNEE SURGERY     RIGHT ELBOW SURGERY  1992 (APPROX)   TENDINITIS   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  09-12-2010  DR GAIL   LEFT LATERAL WALL   VASECTOMY REVERSAL  1990  (APPROX)    Social History   Socioeconomic History   Marital status: Married    Spouse name: Not on file   Number of children: 4   Years of education: Not on file   Highest education level: Not on file  Occupational History   Occupation: retirement Copywriter, Advertising: [employer]  Tobacco Use   Smoking status: Never   Smokeless tobacco: Never  Vaping Use   Vaping status: Never Used  Substance and Sexual Activity   Alcohol use: Yes    Alcohol/week: 3.0 - 4.0 standard drinks of alcohol    Types: 3 - 4 Glasses of wine per week    Comment: occasional   Drug use: No   Sexual activity: Not on file  Other Topics Concern   Not on file  Social History Narrative   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 He does not use cigarettes or recreational drugs. He does use alcohol occasionally.   Social Drivers of Corporate Investment Banker Strain: Not on [employer]  Insecurity: No Food Insecurity (06/21/2023)   Hunger Vital Sign    Worried About Running Out of Food in the Last Year: Never true    Ran Out of Food in the Last Year: Never true  Transportation Needs: No Transportation Needs (06/21/2023)   PRAPARE - Administrator, Civil Service (Medical): No    Lack of Transportation (Non-Medical): No  Physical Activity: Not on file  Stress: Not on file  Social Connections: Not on file  Intimate Partner Violence: Not At Risk (06/21/2023)   Humiliation, Afraid, Rape, and Kick questionnaire    Fear of Current or Ex-Partner: No    Emotionally Abused: No     Physically Abused: No    Sexually Abused: No    Family History  Problem Relation Age of Onset   Stroke Mother     ROS: no fevers or chills, productive cough, hemoptysis, dysphasia, odynophagia, melena, hematochezia, dysuria, hematuria, rash, seizure activity, orthopnea, PND, pedal edema, claudication. Remaining systems are negative.  Physical Exam: Well-developed well-nourished in no acute distress.  Skin is warm and dry.  HEENT is normal.  Neck is supple.  Chest is clear to auscultation with normal expansion.  Cardiovascular exam is irregular Abdominal exam nontender or distended. No masses palpated. Extremities show no edema. neuro grossly intact   A/P  1 permanent atrial fibrillation-patient's heart rate is controlled on no medications.  He suffered a recent intracranial hemorrhage.  He is now scheduled for Watchman to avoid long-term anticoagulation.  2 coronary calcification-continue statin.  Patient is not having chest pain.  Given degree of elevation we will likely plan functional study after watchman placed.  3 hyperlipidemia-continue statin.  4 prior history of palpitations-no recent symptoms.  Redell Shallow, MD

## 2023-09-25 ENCOUNTER — Encounter: Payer: Self-pay | Admitting: Neurology

## 2023-09-25 ENCOUNTER — Telehealth: Payer: Self-pay | Admitting: Neurology

## 2023-09-25 NOTE — Telephone Encounter (Signed)
 Pt called stating that he was scheduled to see the MD before a procedure that has been scheduled and now he was called to r/s appt due to provider being out. Pt states that he needs to be scheduled no later than the beginning of Feb. And there was nothing until the middle of March. Pt stated that they will send a Mychart message as well because the can not wait till March. Please advise.

## 2023-09-25 NOTE — Telephone Encounter (Signed)
 LVM and sent mychart msg informing pt of need to reschedule 10/28/23 appt - MD out

## 2023-09-29 ENCOUNTER — Ambulatory Visit: Payer: Medicare Other | Attending: Cardiology | Admitting: Cardiology

## 2023-09-29 ENCOUNTER — Encounter: Payer: Self-pay | Admitting: Cardiology

## 2023-09-29 VITALS — BP 124/62 | HR 62 | Ht 73.0 in | Wt 212.0 lb

## 2023-09-29 DIAGNOSIS — I4821 Permanent atrial fibrillation: Secondary | ICD-10-CM | POA: Diagnosis present

## 2023-09-29 DIAGNOSIS — E7800 Pure hypercholesterolemia, unspecified: Secondary | ICD-10-CM | POA: Insufficient documentation

## 2023-09-29 DIAGNOSIS — I610 Nontraumatic intracerebral hemorrhage in hemisphere, subcortical: Secondary | ICD-10-CM | POA: Insufficient documentation

## 2023-09-29 DIAGNOSIS — I2510 Atherosclerotic heart disease of native coronary artery without angina pectoris: Secondary | ICD-10-CM | POA: Diagnosis present

## 2023-09-29 NOTE — Patient Instructions (Signed)
    Follow-Up: At [name], you and your health needs are our priority.  As part of our continuing mission to provide you with exceptional heart care, we have created designated Provider Care Teams.  These Care Teams include your primary Cardiologist (physician) and Advanced Practice Providers (APPs -  Physician Assistants and Nurse Practitioners) who all work together to provide you with the care you need, when you need it.  We recommend signing up for the patient portal called "MyChart".  Sign up information is provided on this After Visit Summary.  MyChart is used to connect with patients for Virtual Visits (Telemedicine).  Patients are able to view lab/test results, encounter notes, upcoming appointments, etc.  Non-urgent messages can be sent to your provider as well.   To learn more about what you can do with MyChart, go to [email or web address].    Your next appointment:   3 month(s)  Provider:   Brian Crenshaw, MD     

## 2023-10-02 ENCOUNTER — Ambulatory Visit (INDEPENDENT_AMBULATORY_CARE_PROVIDER_SITE_OTHER): Payer: Medicare Other | Admitting: Neurology

## 2023-10-02 VITALS — BP 112/72 | HR 80 | Ht 73.0 in | Wt 211.0 lb

## 2023-10-02 DIAGNOSIS — I482 Chronic atrial fibrillation, unspecified: Secondary | ICD-10-CM

## 2023-10-02 DIAGNOSIS — Z8673 Personal history of transient ischemic attack (TIA), and cerebral infarction without residual deficits: Secondary | ICD-10-CM

## 2023-10-02 NOTE — Progress Notes (Signed)
 [facility] [address] [place]. [place] [address] [phone]       OFFICE FOLLOW-UP VISIT NOTE  [name] Date of Birth:  January 16, 1946 Medical Record Number:  [id number]   Referring MD:  Jorene Alamin, NP  Reason for Referral:  ICH  HPI: Initial visit 06/24/2023 Mr. Lady is a pleasant 78 year old Caucasian male seen today for initial office consultation visit for intracerebral hemorrhage.  History is obtained from the patient and his wife and 2 daughters and son who accompany him and review of electronic medical records.  I personally reviewed pertinent available imaging films and PACS.  He has past medical history of chronic atrial fibrillation on Eliquis , hypothyroidism, migraines, gastroesophageal flux disease and rosacea.  He presented on 06/11/2023 to [facility] emergency room with a 2-day history of headache and lethargy.  Neurological exam was quite nonfocal however CT scan of the head showed a early subacute large right basal ganglia and basal for brain parenchymal hemorrhage without significant intraventricular extension.  There is mild cytotoxic edema but no significant midline shift.  CT angiogram of the head and neck showed no spot sign or large vessel stenosis.  Patient was on Eliquis  and this was reversed with Andexxa .  Blood pressures were adequately controlled.  Patient was transferred to [facility] and admitted to the neurological intensive care unit where he was closely monitored.  He remained stable and was seen by physical Occupational Therapy and felt to require only outpatient therapy needs.  MRI scan showed stable right basal ganglia hemorrhage with 7 mm mid right-to-left shift with slight layering of the hemorrhage in the occipital horns.  2D echo showed ejection fraction 55 to 60%.  Left atrial size was severely dilated.  LDL cholesterol was 35 mg percent.  Hemoglobin A1c was 6.0.  Patient was advised to stop his anticoagulation and  alternatives like Watchman device versus participation in the aspire trial resuming aspirin  or Eliquis  in 2 to 4 weeks were also discussed.  Patient however returned to the emergency room the day of discharge with severe headache and repeat CT scan of the head was obtained which showed no acute abnormality and he was sent home.  He returned to the emergency room on 06/18/2023 for a fall with a nonfocal exam.  He stated he fell and struck his head and left side of the face.  He was admitted for a few days for deconditioning as he was unable to get up by himself and wife could not care for him.  CT scan of the head showed no acute findings except evolutionary changes.  CT scan of the lumbar spine and pelvis show degenerative changes with moderate neuroforaminal narrowing and mild central canal stenosis.  MRI lumbar spine showed mild right L4-5 and L5-S1 foraminal stenosis and bilateral lateral recess stenosis.  He was recommended Tylenol  and Lyrica  for his pain.  He was discharged home with home physical Occupational Therapy which started yesterday.  He is now able to ambulate with a walker.  He has had no further falls.  Patient has longstanding atrial fibrillation and was previously on warfarin and has been on Eliquis  for last several years.  Patient does state that he has noticed that his cognitive thinking has slowed down a bit since his brain hemorrhage.  He is careful with his walking and uses a walker. Update 10/02/2023 : He returns for follow-up after last visit 3 months ago.  He is accompanied by his wife.  He  states he is doing well.  He is tolerated aspirin  81 mg daily without bruising or bleeding.  He has not had any recurrent stroke or TIA symptoms.  He has seen Dr. Cindie and decided to undergo the Watchman device which is scheduled for 10/30/2023.  He has several questions about anticoagulation and stroke risk after the procedure during this visit with me today.  He is tolerating Crestor  well without  muscle aches and pains.  His blood pressure is well-controlled and today it is 112/72.  He continues to have mild short-term memory and cognitive difficulties which appear unchanged.  He has not been quite active and doing cognitively challenging activities on a regular basis but states he will do so. ROS:   14 system review of systems is positive for balance and walking difficulties, memory difficulties all other systems negative  PMH:  Past Medical History:  Diagnosis Date   Atrial fibrillation (HCC)    History of kidney stones 10-23-2011  SPONTANEOUSLY PASSED   History of migraine    Hyperthyroidism NO MEDS OR TX   MONITORED BY DR SOUTH   Mild acid reflux OCCASIONAL-  WATCHES DIET   PONV (postoperative nausea and vomiting)    Rosacea     Social History:  Social History   Socioeconomic History   Marital status: Married    Spouse name: Not on file   Number of children: 4   Years of education: Not on file   Highest education level: Not on file  Occupational History   Occupation: retirement Copywriter, Advertising: [employer]  Tobacco Use   Smoking status: Never   Smokeless tobacco: Never  Vaping Use   Vaping status: Never Used  Substance and Sexual Activity   Alcohol use: Yes    Alcohol/week: 3.0 - 4.0 standard drinks of alcohol    Types: 3 - 4 Glasses of wine per week    Comment: occasional   Drug use: No   Sexual activity: Not on file  Other Topics Concern   Not on file  Social History Narrative   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 He does not use cigarettes or recreational drugs. He does use alcohol occasionally.   Social Drivers of Corporate Investment Banker Strain: Not on file  Food Insecurity: No Food Insecurity (06/21/2023)   Hunger Vital Sign    Worried About Running Out of Food in the Last Year: Never true    Ran Out of Food in the Last Year: Never true  Transportation Needs: No Transportation Needs (06/21/2023)    PRAPARE - Administrator, Civil Service (Medical): No    Lack of Transportation (Non-Medical): No  Physical Activity: Not on file  Stress: Not on file  Social Connections: Not on file  Intimate Partner Violence: Not At Risk (06/21/2023)   Humiliation, Afraid, Rape, and Kick questionnaire    Fear of Current or Ex-Partner: No    Emotionally Abused: No    Physically Abused: No    Sexually Abused: No    Medications:   Current Outpatient Medications on File Prior to Visit  Medication Sig Dispense Refill   aspirin  EC 81 MG tablet Take 1 tablet (81 mg total) by mouth daily. Swallow whole. 30 tablet 12   methimazole  (TAPAZOLE ) 10 MG tablet Take 10 mg by mouth every evening.     rosuvastatin  (CRESTOR ) 20 MG tablet TAKE 1 TABLET DAILY. SCHEDULE OFFICE VISIT FOR FUTURE REFILLS (Patient taking  differently: Take 20 mg by mouth every evening.) 90 tablet 2   No current facility-administered medications on file prior to visit.    Allergies:   Allergies  Allergen Reactions   Ampicillin Rash    Physical Exam General: well developed, well nourished pleasant elderly Caucasian male, seated, in no evident distress Head: head normocephalic and atraumatic.   Neck: supple with no carotid or supraclavicular bruits Cardiovascular: regular rate and rhythm, no murmurs Musculoskeletal: no deformity Skin:  no rash/petichiae Vascular:  Normal pulses all extremities  Neurologic Exam Mental Status: Awake and fully alert. Oriented to place and time. Recent and remote memory diminished.. Attention span, concentration and fund of knowledge appropriate. Mood and affect appropriate.  Diminished recall 2/3.  Able to name 10 animals which can walk on 4 legs.  Clock drawing 4/4. Cranial Nerves: Fundoscopic exam not done. Pupils equal, briskly reactive to light. Extraocular movements full without nystagmus. Visual fields full to confrontation. Hearing intact. Facial sensation intact. Face, tongue, palate  moves normally and symmetrically.  Motor: Normal bulk and tone. Normal strength in all tested extremity muscles. Sensory.: intact to touch , pinprick , position and vibratory sensation.  Coordination: Rapid alternating movements normal in all extremities. Finger-to-nose and heel-to-shin performed accurately bilaterally. Gait and Station: Arises from chair without difficulty. Stance is normal. Gait slightly broad-based and uses a walker.  Unable to do tandem walking.   Reflexes: 1+ and symmetric. Toes downgoing.   NIHSS  1 Modified Rankin  2      ASSESSMENT: 78 year old Caucasian male with right basal ganglia hemorrhage and September 2024 secondary to anticoagulation with Eliquis  for chronic atrial fibrillation.  He is doing reasonably well with only mild cognitive and gait balance difficulties.     PLAN:I had a  quite long discussion the patient, his wife,  about his recent intracerebral hemorrhage related to anticoagulation with Eliquis  for his chronic atrial fibrillation.  He is doing reasonably well with only mild cognitive and gait and balance difficulties.  We discussed alternatives to anticoagulation including Watchman device, resuming anticoagulation or aspirin  or participation in the ASPIRE  (aspirin  versus Eliquis  following intracerebral hemorrhage ) trial.  I recommend continuing aspirin  81 mg daily now for stroke prevention and referral to cardiac electrophysiology physiology to discuss Watchman device before patient makes a final decision.  Maintain aggressive risk factor modification with strict control of hypertension with blood pressure goal below 130/90, lipids with LDL cholesterol goal below 70 mg percent, diabetes with hemoglobin A1c goal below 6.5%.  He also was advised to use his walker at all times and we discussed fall safety precautions.  We also discussed his mild cognitive impairment and I encouraged him to increase participation in cognitively challenging activities like  solving crossword patient will has decided to undergo the Watchman device and he was advised to keep his appointment with Dr. Cindie for the procedure on 10/30/2023.  He will return for follow-up in the future with me in 6 months or call earlier if necessary. Greater than 50% time during this 35-minute  visit was spent on counseling and coordination of care about his intracerebral hemorrhage and discussion about risk-benefit of resuming anticoagulation as well as alternatives to anticoagulation and answering questions with the patient and his family. Eather Popp, MD Note: This document was prepared with digital dictation and possible smart phrase technology. Any transcriptional errors that result from this process are unintentional.

## 2023-10-02 NOTE — Patient Instructions (Addendum)
 I had a  quite long discussion the patient, his wife, son and 2 daughters about his recent intracerebral hemorrhage related to anticoagulation with Eliquis  for his chronic atrial fibrillation.  He is doing reasonably well with only mild cognitive and gait and balance difficulties.  We discussed alternatives to anticoagulation including Watchman device, resuming anticoagulation or aspirin  or participation in the ASPIRE  (aspirin  versus Eliquis  following intracerebral hemorrhage ) trial.  I recommend starting aspirin  81 mg daily now for stroke prevention and referral to cardiac electrophysiology physiology to discuss Watchman device before patient makes a final decision.  Maintain aggressive risk factor modification with strict control of hypertension with blood pressure goal below 130/90, lipids with LDL cholesterol goal below 70 mg percent, diabetes with hemoglobin A1c goal below 6.5%.  He also was advised to use his walker at all times and we discussed fall safety precautions.  We also discussed his mild cognitive impairment and I encouraged him to increase participation in cognitively challenging activities like solving crossword he will return for follow-up in the future with me in 4 months or call earlier if necessary.  Left Atrial Appendage Closure Device Implantation  Left atrial appendage (LAA) closure device implantation is a procedure to put a small device in the LAA of the heart. The LAA is a small sac in the wall of the heart's top left chamber. Blood clots can form in the LAA in people with atrial fibrillation (AFib). AFib is a type of irregular or rapid heartbeat (arrhythmia). The device closes the LAA to help prevent blood clots from forming and a potential stroke. Tell a health care provider about: Any allergies you have. All medicines you are taking, including vitamins, herbs, eye drops, creams, and over-the-counter medicines. Any problems you or family members have had with anesthesia. Any  bleeding problems you have. Any surgeries you have had. Any medical conditions you have. Whether you are pregnant or may be pregnant. What are the risks? Your health care provider will talk with you about risks. These may include: Infection. Bleeding. Allergic reactions to medicines or dyes. Damage to nearby structures or organs. Blood clots. Device failure. Other risks include: Heart attack. Changes in heart rhythm. Stroke. What happens before the procedure? Eating and drinking restrictions Follow instructions from your provider about what you may eat and drink. These may include: 8 hours before the procedure Stop eating most foods. Do not eat meat, fried foods, or fatty foods. Eat only light foods, such as toast or crackers. All liquids are okay except energy drinks and alcohol. 6 hours before the procedure Stop eating. Drink only clear liquids, such as water , clear fruit juice, black coffee, plain tea, and sports drinks. Do not drink energy drinks or alcohol. 2 hours before the procedure Stop drinking all liquids. You may be allowed to take medicines with small sips of water . If you do not follow your provider's instructions, your procedure may be delayed or canceled. Medicines Ask your provider about: Changing or stopping your regular medicines. These include any diabetes medicines or blood thinners you take. Taking medicines such as aspirin  and ibuprofen. These medicines can thin your blood. Do not take them unless your provider tells you to. Taking over-the-counter medicines, vitamins, herbs, and supplements. Tests You may have a physical exam. You may have other tests, including: Blood tests. An electrocardiogram (ECG). This checks your heart's electrical patterns and rhythms. An echocardiogram. This looks at how well the heart is pumping. General instructions Do not use any products that  contain nicotine or tobacco. These products include cigarettes, chewing  tobacco, and vaping devices, such as e-cigarettes. If you need help quitting, ask your provider. Ask your provider what steps will be taken to help prevent infection. These steps may include: Removing hair at the surgery site. Washing skin with a soap that kills germs. Taking antibiotics. If you will be going home right after the procedure, plan to have a responsible adult: Take you home from the hospital or clinic. You will not be allowed to drive. Care for you for the time you are told. What happens during the procedure? An IV will be inserted into one of your veins. You will be given: A sedative. This helps you relax. Anesthesia. This keeps you from feeling pain. It will make you fall asleep for surgery. An incision will be made in a large blood vessel in your groin. A soft tube (catheter) will be put through the incision and moved up through the blood vessel to reach your heart. Dye may be injected so X-rays (fluoroscopy) can be used to guide the catheter through the blood vessel. The closure device will be moved through the catheter and into your heart. The device will be placed so that it closes the LAA. X-rays will be done to make sure the device is in the right place. The catheter will be removed. The closure device will stay in your heart. After pressure is applied over the catheter insertion site to prevent bleeding, a bandage (dressing) will be placed over the area. The procedure may vary among providers and hospitals. What happens after the procedure? Your blood pressure, heart rate, breathing rate, and blood oxygen level will be monitored until you leave the hospital or clinic. You will need to lie flat for a few hours or as told by your provider. Keep your legs straight. Do not bend or cross your legs. You may be given pain medicine. You may need to drink more fluids to flush the dye out of your body. Drink enough fluid to keep your pee (urine) pale yellow. If you were given a  sedative during the procedure, it can affect you for several hours. Do not drive or operate machinery until your provider says that it is safe. This information is not intended to replace advice given to you by your health care provider. Make sure you discuss any questions you have with your health care provider. Document Revised: 04/17/2022 Document Reviewed: 04/17/2022 Elsevier Patient Education  2024 [employer] months or call earlier if necessary.

## 2023-10-06 ENCOUNTER — Ambulatory Visit: Payer: Medicare Other

## 2023-10-06 ENCOUNTER — Other Ambulatory Visit: Payer: Self-pay | Admitting: Cardiology

## 2023-10-06 NOTE — Progress Notes (Signed)
 [facility]                                     Cardiology Office Note:    Date:  10/07/2023   ID:  Kyle [name], DOB May 13, 1946, MRN [id number]  PCP:  Nichole Senior, MD  [facility] Cardiologist:  Redell Shallow, MD  [facility] Electrophysiologist:  OLE ONEIDA HOLTS, MD   Referring MD: Nichole Senior, MD   Chief Complaint  Patient presents with   pre LAAO   History of Present Illness:    Kyle [name] is a 78 y.o. male with a hx of hypothyroidism, GERD, and permanent atrial fibrillation with a hx of recent ICH while taking OAC. He was referred to Dr. Holts for consideration of LAAO implant at the request of Dr. Rosemarie.   Mr. Tomkins is followed by Dr. Shallow for his cardiology care. He was diagnosed with AF around 11/2016 and was treated with Elquis. He did well for quite some time however presented to the ED 06/11/23 with an acute headache and lethargy. 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 His Eliquis  was reversed with Andexxa  and he was admitted to the neuro ICU. He subsequently stabilized and was discharged home. He was seen in follow up with Dr. Rosemarie at which time ASA  restarted and restarting anticoagulation was also discussed. He was referred for consideration of watchman.   Pre procedure CT showed anatomy suitable to proceed andhe is scheduled for implant 10/30/23.   Today he is here with his wife and reports he has been well since he was last seen. He denies chest pain, SOB, palpitations, LE edema, orthopnea, PND, dizziness, or syncope. Denies bleeding in stool or urine.    Past Medical History:  Diagnosis Date   Atrial fibrillation (HCC)    History of kidney stones 10-23-2011  SPONTANEOUSLY PASSED   History of migraine    Hyperthyroidism NO MEDS OR TX   MONITORED BY DR SOUTH   Mild acid reflux OCCASIONAL-  WATCHES DIET   PONV (postoperative nausea and vomiting)    Rosacea      Past Surgical History:  Procedure Laterality Date   CARDIOVERSION  10-15-2007  DR MORRIS   NEW ON-SET A-FIB   ELBOW SURGERY     right   ELBOW SURGERY     KNEE ARTHROSCOPY WITH LATERAL MENISECTOMY  08/07/2012   Procedure: KNEE ARTHROSCOPY WITH LATERAL MENISECTOMY;  Surgeon: Lynwood SHAUNNA Bern, MD;  Location: [facility];  Service: Orthopedics;  Laterality: Left;  Left Knee Arthroscopy with Partial Lateral Menisectomy    KNEE SURGERY     RIGHT ELBOW SURGERY  1992 (APPROX)   TENDINITIS   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 Swallow whole.   clopidogrel  (PLAVIX ) 75 MG tablet Take 1 tablet (75 mg total) by mouth daily.   methimazole  (TAPAZOLE ) 10 MG tablet Take 10 mg by mouth every evening.   rosuvastatin  (CRESTOR ) 20 MG tablet TAKE 1 TABLET DAILY. SCHEDULE OFFICE VISIT FOR FUTURE REFILLS (Patient taking differently: Take 20 mg by mouth every evening.)  Allergies:   Ampicillin   Social History   Socioeconomic History   Marital status: Married    Spouse name: Not on file   Number of children: 4   Years of education: Not on file   Highest education level: Not on file  Occupational History   Occupation: retirement broker    Employer: [employer]  Tobacco Use   Smoking status: Never   Smokeless tobacco: Never  Vaping Use   Vaping status: Never Used  Substance and Sexual Activity   Alcohol use: Yes    Alcohol/week: 3.0 - 4.0 standard drinks of alcohol    Types: 3 - 4 Glasses of wine per week    Comment: occasional   Drug use: No   Sexual activity: Not on file  Other Topics Concern   Not on file  Social History Narrative   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 He does not use cigarettes or  recreational drugs. He does use alcohol occasionally.   Social Drivers of Corporate Investment Banker Strain: Not on file  Food Insecurity: No Food Insecurity (06/21/2023)   Hunger Vital Sign    Worried About Running Out of Food in the Last Year: Never true    Ran Out of Food in the Last Year: Never true  Transportation Needs: No Transportation Needs (06/21/2023)   PRAPARE - Administrator, Civil Service (Medical): No    Lack of Transportation (Non-Medical): No  Physical Activity: Not on file  Stress: Not on file  Social Connections: Not on file     Family History: The patient's family history includes Stroke in his mother.  ROS:   Please see the history of present illness.    All other systems reviewed and are negative.  EKGs/Labs/Other Studies Reviewed:    The following studies were reviewed today:   Cardiac Studies & Procedures      ECHOCARDIOGRAM  ECHOCARDIOGRAM COMPLETE 06/12/2023  Narrative ECHOCARDIOGRAM REPORT    Patient Name:   Kyle [name] Date of Exam: 06/12/2023 Medical Rec #:  [id number]    Height:       73.0 in Accession #:    [id number]   Weight:       203.9 lb Date of Birth:  05/11/1946     BSA:          2.169 m Patient Age:    77 years     BP:           137/92 mmHg Patient Gender: M            HR:           72 bpm. Exam Location:  Inpatient  Procedure: 2D Echo, Cardiac Doppler and Color Doppler  Indications:    Stroke I63.9  History:        Patient has prior history of Echocardiogram examinations, most recent 04/05/2022. Arrythmias:Atrial Fibrillation.  Sonographer:    Tinnie Gosling RDCS Referring Phys: ERIC LINDZEN  IMPRESSIONS   1. Left ventricular ejection fraction, by estimation, is 55 to 60%. The left ventricle has normal function. The left ventricle has no regional wall motion abnormalities. Left ventricular diastolic parameters are indeterminate. 2. Right ventricular systolic function is normal. The right ventricular size is  mildly enlarged. Tricuspid regurgitation signal is inadequate for assessing PA pressure. 3. Left atrial size was severely dilated. 4. Right atrial size was moderately dilated. 5. The mitral valve was not well visualized. Trivial mitral valve regurgitation.  No evidence of mitral stenosis. 6. The aortic valve is tricuspid. There is mild calcification of the aortic valve. Aortic valve regurgitation is not visualized. No aortic stenosis is present. 7. The inferior vena cava is dilated in size with <50% respiratory variability, suggesting right atrial pressure of 15 mmHg.  Comparison(s): Prior images reviewed side by side. Tricuspid regurgitation has improved.  FINDINGS Left Ventricle: Left ventricular ejection fraction, by estimation, is 55 to 60%. The left ventricle has normal function. The left ventricle has no regional wall motion abnormalities. The left ventricular internal cavity size was normal in size. There is no left ventricular hypertrophy. Left ventricular diastolic parameters are indeterminate.  Right Ventricle: The right ventricular size is mildly enlarged. No increase in right ventricular wall thickness. Right ventricular systolic function is normal. Tricuspid regurgitation signal is inadequate for assessing PA pressure.  Left Atrium: Left atrial size was severely dilated.  Right Atrium: Right atrial size was moderately dilated.  Pericardium: There is no evidence of pericardial effusion.  Mitral Valve: The mitral valve was not well visualized. Trivial mitral valve regurgitation. No evidence of mitral valve stenosis.  Tricuspid Valve: The tricuspid valve is normal in structure. Tricuspid valve regurgitation is not demonstrated. No evidence of tricuspid stenosis.  Aortic Valve: The aortic valve is tricuspid. There is mild calcification of the aortic valve. Aortic valve regurgitation is not visualized. No aortic stenosis is present.  Pulmonic Valve: The pulmonic valve was normal in  structure. Pulmonic valve regurgitation is trivial. No evidence of pulmonic stenosis.  Aorta: The aortic root and ascending aorta are structurally normal, with no evidence of dilitation.  Venous: The inferior vena cava is dilated in size with less than 50% respiratory variability, suggesting right atrial pressure of 15 mmHg.  IAS/Shunts: No atrial level shunt detected by color flow Doppler.   LEFT VENTRICLE PLAX 2D LVIDd:         4.90 cm LVIDs:         3.30 cm LV PW:         0.90 cm LV IVS:        0.90 cm LVOT diam:     2.00 cm LV SV:         57 LV SV Index:   26 LVOT Area:     3.14 cm   RIGHT VENTRICLE             IVC RV S prime:     11.60 cm/s  IVC diam: 2.50 cm TAPSE (M-mode): 1.7 cm  LEFT ATRIUM              Index        RIGHT ATRIUM           Index LA diam:        4.70 cm  2.17 cm/m   RA Area:     28.40 cm LA Vol (A2C):   110.0 ml 50.70 ml/m  RA Volume:   87.20 ml  40.19 ml/m LA Vol (A4C):   108.0 ml 49.78 ml/m LA Biplane Vol: 111.0 ml 51.16 ml/m AORTIC VALVE LVOT Vmax:   90.20 cm/s LVOT Vmean:  59.700 cm/s LVOT VTI:    0.180 m  AORTA Ao Root diam: 3.40 cm Ao Asc diam:  3.20 cm   SHUNTS Systemic VTI:  0.18 m Systemic Diam: 2.00 cm  Stanly Leavens MD Electronically signed by Stanly Leavens MD Signature Date/Time: 06/12/2023/12:31:23 PM    Final  EKG:  EKG is ordered today.  The ekg ordered today demonstrates AF with HR 71bpm.   Recent Labs: 06/18/2023: ALT 21 06/20/2023: BUN 20; Creatinine, Ser 1.10; Hemoglobin 16.8; Magnesium 2.3; Platelets 293; Potassium 5.2; Sodium 134; TSH 7.429   Recent Lipid Panel    Component Value Date/Time   CHOL 101 06/12/2023 1038   CHOL 77 (L) 01/21/2022 1155   TRIG 42 06/12/2023 1038   HDL 32 (L) 06/12/2023 1038   HDL 28 (L) 01/21/2022 1155   CHOLHDL 3.2 06/12/2023 1038   VLDL 8 06/12/2023 1038   LDLCALC 61 06/12/2023 1038   LDLCALC 35 01/21/2022 1155   Risk Assessment/Calculations:     HAS-BLED score 2 Hypertension No  Abnormal renal and liver function (Dialysis, transplant, Cr >2.26 mg/dL /Cirrhosis or Bilirubin >2x Normal or AST/ALT/AP >3x Normal) No  Stroke No  Bleeding Yes  Labile INR (Unstable/high INR) No  Elderly (>65) Yes  Drugs or alcohol (>= 8 drinks/week, anti-plt or NSAID) No    CHA2DS2-VASc Score = 3  The patient's score is based upon: CHF History: 0 HTN History: 0 Diabetes History: 0 Stroke History: 0 Vascular Disease History: 1 Age Score: 2 Gender Score: 0  Physical Exam:    VS:  BP 118/68   Pulse 71   Ht 6' 1 (1.854 m)   Wt 203 lb (92.1 kg)   SpO2 95%   BMI 26.78 kg/m     Wt Readings from Last 3 Encounters:  10/07/23 203 lb (92.1 kg)  10/02/23 211 lb (95.7 kg)  09/29/23 212 lb (96.2 kg)    General: Well developed, well nourished, NAD Lungs:Clear to ausculation bilaterally. No wheezes, rales, or rhonchi. Breathing is unlabored. Cardiovascular: Irregularly irregular. No murmurs Extremities: No edema.  Neuro: Alert and oriented. No focal deficits. No facial asymmetry. MAE spontaneously. Psych: Responds to questions appropriately with normal affect.    ASSESSMENT/PLAN:    Permanent atrial fibrillation: Referred for LAAO consideration with hx of ICH. Plan to start Plavix  75mg  daily on 1/30 and continue ASA 81mg  daily. Instruction letter reviewed at length with the patient and wife. CHG soap given. Obtain BMET, CBC today. Will make follow up with our team post implant.   ICH: No further issues. Plan ASA and Plavix  for post implant therapy. Continue to follow with Dr. Rosemarie.   Elevated CCS: Noted to have CCS of 2444 on pre implant CT which is 91st percentile for age and sex matched control. To discuss the need for long term ASA after post Watchman therapy complete.   I spent 25 minutes caring for this patient today including face-to-face discussions, ordering and reviewing labs, reviewing records from [facility] and other outside  facilities, documenting in the record, and arranging for follow up.    Medication Adjustments/Labs and Tests Ordered: Current medicines are reviewed at length with the patient today.  Concerns regarding medicines are outlined above.  Orders Placed This Encounter  Procedures   Basic metabolic panel   CBC   EKG 12-Lead   Meds ordered this encounter  Medications   clopidogrel  (PLAVIX ) 75 MG tablet    Sig: Take 1 tablet (75 mg total) by mouth daily.    Dispense:  90 tablet    Refill:  1    Patient Instructions  Medication Instructions:  Start Clopidogrel  (Plavix ) 75 mg daily    *If you need a refill on your cardiac medications before your next appointment, please call your pharmacy*   Lab Work: BMET, CBC - Today  If you have labs (blood work) drawn today and your tests are completely normal, you will receive your results only by: MyChart Message (if you have MyChart) OR A paper copy in the mail If you have any lab test that is abnormal or we need to change your treatment, we will call you to review the results.   Testing/Procedures: You are scheduled for a Watchman implantation on Thursday, February 6 with Dr. Ole Holts.   Follow-Up: Follow up as scheduled  Other Instructions          Signed, Kate Minus, NP  10/07/2023 9:47 AM    [facility]

## 2023-10-07 ENCOUNTER — Ambulatory Visit: Payer: Medicare Other | Attending: Cardiology | Admitting: Cardiology

## 2023-10-07 VITALS — BP 118/68 | HR 71 | Ht 73.0 in | Wt 203.0 lb

## 2023-10-07 DIAGNOSIS — I4821 Permanent atrial fibrillation: Secondary | ICD-10-CM | POA: Diagnosis not present

## 2023-10-07 DIAGNOSIS — I619 Nontraumatic intracerebral hemorrhage, unspecified: Secondary | ICD-10-CM | POA: Insufficient documentation

## 2023-10-07 DIAGNOSIS — I2510 Atherosclerotic heart disease of native coronary artery without angina pectoris: Secondary | ICD-10-CM | POA: Insufficient documentation

## 2023-10-07 LAB — CBC

## 2023-10-07 MED ORDER — CLOPIDOGREL BISULFATE 75 MG PO TABS: 75.0000 mg | ORAL_TABLET | Freq: Every day | ORAL | 1 refills | Status: DC

## 2023-10-07 NOTE — Patient Instructions (Addendum)
 Medication Instructions:  Start Clopidogrel  (Plavix ) 75 mg daily on Thursday, January 30   *If you need a refill on your cardiac medications before your next appointment, please call your pharmacy*   Lab Work: BMET, CBC - Today   If you have labs (blood work) drawn today and your tests are completely normal, you will receive your results only by: MyChart Message (if you have MyChart) OR A paper copy in the mail If you have any lab test that is abnormal or we need to change your treatment, we will call you to review the results.   Testing/Procedures: You are scheduled for a Watchman implantation on Thursday, February 6 with Dr. Ole Holts.   Follow-Up: Follow up as scheduled  Other Instructions

## 2023-10-08 LAB — CBC
Hematocrit: 48.3 % (ref 37.5–51.0)
Hemoglobin: 16.2 g/dL (ref 13.0–17.7)
MCH: 32.3 pg (ref 26.6–33.0)
MCHC: 33.5 g/dL (ref 31.5–35.7)
MCV: 96 fL (ref 79–97)
Platelets: 192 10*3/uL (ref 150–450)
RBC: 5.02 x10E6/uL (ref 4.14–5.80)
RDW: 12 % (ref 11.6–15.4)
WBC: 5.4 10*3/uL (ref 3.4–10.8)

## 2023-10-08 LAB — BASIC METABOLIC PANEL
BUN/Creatinine Ratio: 16 (ref 10–24)
BUN: 16 mg/dL (ref 8–27)
CO2: 20 mmol/L (ref 20–29)
Calcium: 9.3 mg/dL (ref 8.6–10.2)
Chloride: 105 mmol/L (ref 96–106)
Creatinine, Ser: 0.98 mg/dL (ref 0.76–1.27)
Glucose: 82 mg/dL (ref 70–99)
Potassium: 4.7 mmol/L (ref 3.5–5.2)
Sodium: 142 mmol/L (ref 134–144)
eGFR: 80 mL/min/{1.73_m2} (ref >59)

## 2023-10-08 MED ORDER — CLOPIDOGREL BISULFATE 75 MG PO TABS: 75.0000 mg | ORAL_TABLET | Freq: Every day | ORAL | 3 refills | Status: DC

## 2023-10-27 ENCOUNTER — Other Ambulatory Visit: Payer: Self-pay

## 2023-10-27 ENCOUNTER — Telehealth: Payer: Self-pay

## 2023-10-27 DIAGNOSIS — I619 Nontraumatic intracerebral hemorrhage, unspecified: Secondary | ICD-10-CM

## 2023-10-27 DIAGNOSIS — I4821 Permanent atrial fibrillation: Secondary | ICD-10-CM

## 2023-10-27 NOTE — Telephone Encounter (Signed)
 Marland Kitchen

## 2023-10-28 ENCOUNTER — Ambulatory Visit: Payer: Medicare Other | Admitting: Neurology

## 2023-10-29 ENCOUNTER — Telehealth: Payer: Self-pay

## 2023-10-29 NOTE — Telephone Encounter (Signed)
 Confirmed procedure date of 10/30/2023. Confirmed arrival time of 1100 for procedure time at 1330. Reviewed pre-procedure instructions with patient. The patient understands to call if questions/concerns arise prior to procedure.  He was grateful for call and agreed with plan.

## 2023-10-30 ENCOUNTER — Encounter (HOSPITAL_COMMUNITY): Payer: Self-pay | Admitting: Cardiology

## 2023-10-30 ENCOUNTER — Inpatient Hospital Stay (HOSPITAL_COMMUNITY)
Admission: RE | Disposition: A | Discharge status: Home / Self Care | Payer: Self-pay | Source: Ambulatory Visit | Attending: Cardiology

## 2023-10-30 ENCOUNTER — Inpatient Hospital Stay (HOSPITAL_COMMUNITY): Payer: Medicare Other

## 2023-10-30 ENCOUNTER — Inpatient Hospital Stay (HOSPITAL_COMMUNITY)
Admission: RE | Admit: 2023-10-30 | Discharge: 2023-10-31 | DRG: 274 | Disposition: A | Discharge status: Home / Self Care | Payer: Medicare Other | Source: Ambulatory Visit | Attending: Cardiology | Admitting: Cardiology

## 2023-10-30 DIAGNOSIS — Z7982 Long term (current) use of aspirin: Secondary | ICD-10-CM

## 2023-10-30 DIAGNOSIS — Z006 Encounter for examination for normal comparison and control in clinical research program: Secondary | ICD-10-CM | POA: Diagnosis not present

## 2023-10-30 DIAGNOSIS — Z95818 Presence of other cardiac implants and grafts: Principal | ICD-10-CM

## 2023-10-30 DIAGNOSIS — E039 Hypothyroidism, unspecified: Secondary | ICD-10-CM | POA: Diagnosis present

## 2023-10-30 DIAGNOSIS — Z880 Allergy status to penicillin: Secondary | ICD-10-CM | POA: Diagnosis not present

## 2023-10-30 DIAGNOSIS — K219 Gastro-esophageal reflux disease without esophagitis: Secondary | ICD-10-CM | POA: Diagnosis present

## 2023-10-30 DIAGNOSIS — I4821 Permanent atrial fibrillation: Secondary | ICD-10-CM | POA: Diagnosis present

## 2023-10-30 DIAGNOSIS — I4891 Unspecified atrial fibrillation: Secondary | ICD-10-CM

## 2023-10-30 DIAGNOSIS — I2510 Atherosclerotic heart disease of native coronary artery without angina pectoris: Secondary | ICD-10-CM | POA: Diagnosis not present

## 2023-10-30 DIAGNOSIS — I619 Nontraumatic intracerebral hemorrhage, unspecified: Secondary | ICD-10-CM | POA: Diagnosis present

## 2023-10-30 DIAGNOSIS — Z7902 Long term (current) use of antithrombotics/antiplatelets: Secondary | ICD-10-CM | POA: Diagnosis not present

## 2023-10-30 HISTORY — PX: LEFT ATRIAL APPENDAGE OCCLUSION: EP1229

## 2023-10-30 HISTORY — PX: TRANSESOPHAGEAL ECHOCARDIOGRAM (CATH LAB): EP1270

## 2023-10-30 LAB — CBC
HCT: 44 % (ref 39.0–52.0)
Hemoglobin: 14.5 g/dL (ref 13.0–17.0)
MCH: 31 pg (ref 26.0–34.0)
MCHC: 33 g/dL (ref 30.0–36.0)
MCV: 94.2 fL (ref 80.0–100.0)
Platelets: 142 10*3/uL — ABNORMAL LOW (ref 150–400)
RBC: 4.67 MIL/uL (ref 4.22–5.81)
RDW: 12.6 % (ref 11.5–15.5)
WBC: 3.5 10*3/uL — ABNORMAL LOW (ref 4.0–10.5)
nRBC: 0 % (ref 0.0–0.2)

## 2023-10-30 LAB — POCT ACTIVATED CLOTTING TIME: Activated Clotting Time: 262 s

## 2023-10-30 LAB — ABO/RH: ABO/RH(D): O POS

## 2023-10-30 LAB — SURGICAL PCR SCREEN
MRSA, PCR: NEGATIVE
Staphylococcus aureus: NEGATIVE

## 2023-10-30 LAB — TYPE AND SCREEN
ABO/RH(D): O POS
Antibody Screen: NEGATIVE

## 2023-10-30 LAB — CREATININE, SERUM
Creatinine, Ser: 0.88 mg/dL (ref 0.61–1.24)
GFR, Estimated: >60 mL/min (ref >60)

## 2023-10-30 LAB — ECHO TEE

## 2023-10-30 SURGERY — LEFT ATRIAL APPENDAGE OCCLUSION
Anesthesia: General

## 2023-10-30 MED ORDER — SODIUM CHLORIDE 0.9 % IV SOLN
INTRAVENOUS | Status: DC
Start: 2023-10-30 — End: 2023-10-30

## 2023-10-30 MED ORDER — IOHEXOL 350 MG/ML SOLN
INTRAVENOUS | Status: DC | PRN
  Administered 2023-10-30: 10 mL

## 2023-10-30 MED ORDER — SODIUM CHLORIDE 0.9% FLUSH: 3.0000 mL | INTRAVENOUS | Status: DC | PRN

## 2023-10-30 MED ORDER — ONDANSETRON HCL 4 MG/2ML IJ SOLN
INTRAMUSCULAR | Status: DC | PRN
  Administered 2023-10-30: 4 mg via INTRAVENOUS

## 2023-10-30 MED ORDER — HEPARIN SODIUM (PORCINE) 1000 UNIT/ML IJ SOLN
INTRAMUSCULAR | Status: DC | PRN
  Administered 2023-10-30: 14000 [IU] via INTRAVENOUS
  Administered 2023-10-30: 5000 [IU] via INTRAVENOUS

## 2023-10-30 MED ORDER — SODIUM CHLORIDE 0.9 % IV SOLN: INTRAVENOUS | Status: DC

## 2023-10-30 MED ORDER — ASPIRIN 81 MG PO TBEC: 81.0000 mg | DELAYED_RELEASE_TABLET | Freq: Every day | ORAL | Status: DC

## 2023-10-30 MED ORDER — FENTANYL CITRATE (PF) 100 MCG/2ML IJ SOLN
INTRAMUSCULAR | Status: AC
  Filled 2023-10-30: qty 2

## 2023-10-30 MED ORDER — PROPOFOL 10 MG/ML IV BOLUS
INTRAVENOUS | Status: DC | PRN
  Administered 2023-10-30: 120 mg via INTRAVENOUS
  Administered 2023-10-30: 50 mg via INTRAVENOUS
  Administered 2023-10-30: 100 ug/kg/min via INTRAVENOUS
  Administered 2023-10-30: 40 mg via INTRAVENOUS

## 2023-10-30 MED ORDER — PROTAMINE SULFATE 10 MG/ML IV SOLN
INTRAVENOUS | Status: DC | PRN
  Administered 2023-10-30: 30 mg via INTRAVENOUS

## 2023-10-30 MED ORDER — ROCURONIUM BROMIDE 10 MG/ML (PF) SYRINGE
PREFILLED_SYRINGE | INTRAVENOUS | Status: DC | PRN
  Administered 2023-10-30: 30 mg via INTRAVENOUS
  Administered 2023-10-30: 20 mg via INTRAVENOUS
  Administered 2023-10-30: 50 mg via INTRAVENOUS

## 2023-10-30 MED ORDER — SUGAMMADEX SODIUM 200 MG/2ML IV SOLN
INTRAVENOUS | Status: DC | PRN
  Administered 2023-10-30 (×2): 200 mg via INTRAVENOUS

## 2023-10-30 MED ORDER — ONDANSETRON HCL 4 MG/2ML IJ SOLN: 4.0000 mg | Freq: Four times a day (QID) | INTRAMUSCULAR | Status: DC | PRN

## 2023-10-30 MED ORDER — CLOPIDOGREL BISULFATE 75 MG PO TABS
75.0000 mg | ORAL_TABLET | Freq: Every day | ORAL | Status: DC
  Administered 2023-10-30: 75 mg via ORAL
  Filled 2023-10-30: qty 1

## 2023-10-30 MED ORDER — LIDOCAINE 2% (20 MG/ML) 5 ML SYRINGE
INTRAMUSCULAR | Status: DC | PRN
  Administered 2023-10-30: 50 mg via INTRAVENOUS

## 2023-10-30 MED ORDER — DEXAMETHASONE SODIUM PHOSPHATE 10 MG/ML IJ SOLN
INTRAMUSCULAR | Status: DC | PRN
  Administered 2023-10-30: 10 mg via INTRAVENOUS

## 2023-10-30 MED ORDER — FENTANYL CITRATE (PF) 250 MCG/5ML IJ SOLN
INTRAMUSCULAR | Status: DC | PRN
  Administered 2023-10-30: 100 ug via INTRAVENOUS

## 2023-10-30 MED ORDER — SODIUM CHLORIDE 0.9% FLUSH
3.0000 mL | Freq: Two times a day (BID) | INTRAVENOUS | Status: DC
  Administered 2023-10-31: 3 mL via INTRAVENOUS

## 2023-10-30 MED ORDER — VANCOMYCIN HCL IN DEXTROSE 1-5 GM/200ML-% IV SOLN
1000.0000 mg | INTRAVENOUS | Status: AC
  Administered 2023-10-30: 1000 mg via INTRAVENOUS
  Filled 2023-10-30: qty 200

## 2023-10-30 MED ORDER — CHLORHEXIDINE GLUCONATE 4 % EX SOLN: Freq: Once | CUTANEOUS | Status: DC

## 2023-10-30 MED ORDER — HEPARIN (PORCINE) IN NACL 2000-0.9 UNIT/L-% IV SOLN
INTRAVENOUS | Status: DC | PRN
  Administered 2023-10-30: 1000 mL

## 2023-10-30 MED ORDER — ROSUVASTATIN CALCIUM 20 MG PO TABS
20.0000 mg | ORAL_TABLET | Freq: Every day | ORAL | Status: DC
  Administered 2023-10-30: 20 mg via ORAL
  Filled 2023-10-30: qty 1

## 2023-10-30 MED ORDER — ACETAMINOPHEN 325 MG PO TABS: 650.0000 mg | ORAL_TABLET | ORAL | Status: DC | PRN

## 2023-10-30 MED ORDER — CHLORHEXIDINE GLUCONATE 0.12 % MT SOLN
15.0000 mL | Freq: Once | OROMUCOSAL | Status: AC
  Administered 2023-10-30: 15 mL via OROMUCOSAL
  Filled 2023-10-30 (×2): qty 15

## 2023-10-30 MED ORDER — HEPARIN SODIUM (PORCINE) 5000 UNIT/ML IJ SOLN
5000.0000 [IU] | Freq: Three times a day (TID) | INTRAMUSCULAR | Status: DC
  Filled 2023-10-30: qty 1

## 2023-10-30 SURGICAL SUPPLY — 21 items
BLANKET WARM UNDERBOD FULL ACC (MISCELLANEOUS) ×2 IMPLANT
CATH INFINITI 5FR ANG PIGTAIL (CATHETERS) IMPLANT
CATH NUVISION NON NAV ICE (CATHETERS) IMPLANT
CATH WATCHMAN STEER ACCESS SYS (CATHETERS) IMPLANT
CLOSURE PERCLOSE PROSTYLE (VASCULAR PRODUCTS) IMPLANT
COVER SWIFTLINK CONNECTOR (BAG) IMPLANT
DEVICE WATCHMAN FLX PRO PROC (KITS) IMPLANT
DEVICE WATCHMAN TRUSTEER PROC (KITS) IMPLANT
DILATOR VESSEL 38 20CM 16FR (INTRODUCER) IMPLANT
INTRODUCER PERFORM 12 30 .038 (SHEATH) IMPLANT
KIT SHEA VERSACROSS LAAC CONNE (KITS) IMPLANT
PACK CARDIAC CATHETERIZATION (CUSTOM PROCEDURE TRAY) ×2 IMPLANT
PAD DEFIB RADIO PHYSIO CONN (PAD) ×2 IMPLANT
SHEATH PERFORMER 18FRX30 (VASCULAR PRODUCTS) IMPLANT
SHEATH PINNACLE 8F 10CM (SHEATH) IMPLANT
SHEATH PROBE COVER 6X72 (BAG) ×2 IMPLANT
TRANSDUCER W/STOPCOCK (MISCELLANEOUS) ×2 IMPLANT
WATCHMAN FLX PRO 24 (Prosthesis & Implant Heart) IMPLANT
WATCHMAN FLX PRO 27 (Prosthesis & Implant Heart) IMPLANT
WATCHMAN FLX PRO PROCEDURE (KITS) ×1 IMPLANT
WATCHMAN TRUSTEER PROCEDURE (KITS) ×1 IMPLANT

## 2023-10-30 NOTE — Progress Notes (Addendum)
 Patient called this RN to room to advise spots of blood noted on the gauze of both groins. There is oozing present at both sites. This RN and another RN held pressure for an additional 10 minutes to each site. Gauze marked and will continue to monitor.  2300- Assessed both sites. No hematoma or additional bleeding noted   2330- Assessed both sites. No hematoma or additional bleeding noted

## 2023-10-30 NOTE — Transfer of Care (Signed)
 Immediate Anesthesia Transfer of Care Note  Patient: Kyle [name]  Procedure(s) Performed: LEFT ATRIAL APPENDAGE OCCLUSION TRANSESOPHAGEAL ECHOCARDIOGRAM  Patient Location: PACU  Anesthesia Type:General  Level of Consciousness: awake, alert , oriented, and patient cooperative  Airway & Oxygen Therapy: Patient Spontanous Breathing  Post-op Assessment: Report given to RN, Post -op Vital signs reviewed and stable, Patient moving all extremities, Patient moving all extremities X 4, and Patient able to stick tongue midline  Post vital signs: Reviewed and stable  Last Vitals:  Vitals Value Taken Time  BP 112/70 10/30/23 1711  Temp    Pulse 69 10/30/23 1713  Resp 12 10/30/23 1713  SpO2 94 % 10/30/23 1713  Vitals shown include unfiled device data.  Last Pain:  Vitals:   10/30/23 1138  TempSrc:   PainSc: 0-No pain         Complications: No notable events documented.

## 2023-10-30 NOTE — Progress Notes (Signed)
  Echocardiogram Echocardiogram Transesophageal has been performed.  Royden Corin 10/30/2023, 5:01 PM

## 2023-10-30 NOTE — Discharge Summary (Signed)
 [facility]    Patient ID: Kyle [name],  MRN: [id number], DOB/AGE: [birth year] 78 y.o.  Admit date: 10/30/2023 Discharge date: 10/31/2023  Primary Care Physician: Nichole Senior, MD  Primary Cardiologist: Redell Shallow, MD  Electrophysiologist: OLE ONEIDA HOLTS, MD  Primary Discharge Diagnosis:  Permanent Atrial Fibrillation Poor candidacy for long term anticoagulation due to h/o intracranial hemorrhage  Procedures This Admission:  Transeptal Puncture Intra-procedural TEE which showed no LAA thrombus Left atrial appendage occlusive device placement on 10/30/23 by Dr. Holts.   This study demonstrated: CONCLUSIONS:  1.Successful implantation of a WATCHMAN left atrial appendage occlusive device    2. TEE demonstrating no LAA thrombus 3.  Intracardiac echo demonstrating trivial pericardial effusion 4. No early apparent complications.    Post Implant Anticoagulation Strategy: Continue aspirin  81 mg by mouth once daily and Plavix  75 mg by mouth once daily for 6 months after implant.  After 6 months, stop Plavix .  Plan for CT scan 60 days after implant to assess appendage patency and watchman position.    Brief HPI: Kyle [name] is a 78 y.o. male with a history of hypothyroidism, GERD, and permanent atrial fibrillation with a hx of recent ICH while taking OAC. He was referred to Dr. Holts for consideration of LAAO implant at the request of Dr. Rosemarie and presented to [facility] for elective LAAO.    Kyle [name] is followed by Dr. Shallow for his cardiology care. He was diagnosed with AF around 11/2016 and was treated with Elquis. He did well for quite some time however presented to the ED 06/11/23 with an acute headache and lethargy. 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 His Eliquis  was reversed with Andexxa  and he was admitted to the neuro ICU. He subsequently stabilized and was discharged home. He was  seen in follow up with Dr. Rosemarie at which time ASA  restarted and restarting anticoagulation was also discussed. He was referred for consideration of watchman.    Pre procedure CT showed anatomy suitable to proceed andhe is scheduled for implant 10/30/23.   Hospital Course:  The patient was admitted and underwent left atrial appendage occlusive device placement with 27mm Watchman FLX Pro device on 10/30/23. Groin site with mild bruising after ambulation yesterday evening however very stable today. Wound care and restrictions were reviewed with the patient. The patient has been scheduled for post procedure follow up with a provider in approximately 1 month. He was restarted on ASA and Plavix  today and will continue for 6 months post implant (04/28/24). At that time he will stop Plavix  and continue ASA 81mg  daily. He will require dental SBE during this time and should refrain from dental work or cleanings for at least 30 days post implant. SBE to be RXd at follow up. A repeat CT will be performed in approximately 60 days to ensure proper seal of the device.   Physical Exam: Vitals:   10/30/23 1953 10/31/23 0010 10/31/23 0544 10/31/23 0917  BP: 130/82 118/65  121/74  Pulse: 77 72  78  Resp: 15 19  16   Temp: 98 F (36.7 C) 98.4 F (36.9 C) 97.7 F (36.5 C) 99 F (37.2 C)  TempSrc: Oral Oral Oral Oral  SpO2: 98% 97% 99% 99%  Weight:      Height:       General: Well developed, well nourished, NAD Lungs:Clear to ausculation bilaterally. No wheezes, rales, or rhonchi. Breathing is unlabored. Cardiovascular:  Irregular. No murmurs Extremities: No edema. Groin site stable. Dressing CTI.  Neuro: Alert and oriented. No focal deficits. No facial asymmetry. MAE spontaneously. Psych: Responds to questions appropriately with normal affect.    Labs:   Lab Results  Component Value Date   WBC 3.5 (L) 10/30/2023   HGB 14.5 10/30/2023   HCT 44.0 10/30/2023   MCV 94.2 10/30/2023   PLT 142 (L) 10/30/2023     Recent Labs  Lab 10/31/23 0323  NA 139  K 4.1  CL 106  CO2 23  BUN 16  CREATININE 0.90  CALCIUM  8.8*  GLUCOSE 152*   Discharge Medications:  Allergies as of 10/31/2023       Reactions   Ampicillin Rash        Medication List     TAKE these medications    aspirin  EC 81 MG tablet Take 1 tablet (81 mg total) by mouth daily. Swallow whole.   clopidogrel  75 MG tablet Commonly known as: Plavix  Take 1 tablet (75 mg total) by mouth daily.   doxycycline  20 MG tablet Commonly known as: PERIOSTAT  Take 20 mg by mouth 2 (two) times daily.   methimazole  5 MG tablet Commonly known as: TAPAZOLE  Take 5 mg by mouth every other day.   rosuvastatin  20 MG tablet Commonly known as: CRESTOR  TAKE 1 TABLET DAILY (SCHEDULE OFFICE VISIT FOR FUTURE REFILLS)        Disposition:  Home  Discharge Instructions     Call MD for:  difficulty breathing, headache or visual disturbances   Complete by: As directed    Call MD for:  extreme fatigue   Complete by: As directed    Call MD for:  hives   Complete by: As directed    Call MD for:  persistant dizziness or light-headedness   Complete by: As directed    Call MD for:  persistant nausea and vomiting   Complete by: As directed    Call MD for:  redness, tenderness, or signs of infection (pain, swelling, redness, odor or green/yellow discharge around incision site)   Complete by: As directed    Call MD for:  severe uncontrolled pain   Complete by: As directed    Call MD for:  temperature >100.4   Complete by: As directed    Diet - low sodium heart healthy   Complete by: As directed    Discharge instructions   Complete by: As directed    [facility] Procedure, Care After  Procedure MD: Dr. Cindie Olds Clinical Coordinator: Rockie Redman, RN  This sheet gives you information about how to care for yourself after your procedure. Your health care provider may also give you more specific instructions. If you have problems or questions,  contact your health care provider.  What can I expect after the procedure? After the procedure, it is common to have: Bruising around your puncture site. Tenderness around your puncture site. Tiredness (fatigue).  Medication instructions It is very important to continue to take your blood thinner as directed by your doctor after the Watchman procedure. Call your procedure doctor's office with question or concerns. If you are on Coumadin (warfarin), you will have your INR checked the week after your procedure, with a goal INR of 2.0 - 3.0. Please follow your medication instructions on your discharge summary. Only take the medications listed on your discharge paperwork.  Follow up You will be seen in 1 month after your procedure You will have a repeat CT scan approximately 8 weeks  after your procedure mark to check your device You will follow up the MD/APP who performed your procedure 6 months after your procedure The Watchman Clinical Coordinator will check in with you from time to time, including 1 and 2 years after your procedure.    Follow these instructions at home: Puncture site care  Follow instructions from your health care provider about how to take care of your puncture site. Make sure you: If present, leave stitches (sutures), skin glue, or adhesive strips in place.  If a large square bandage is present, this may be removed 24 hours after surgery.  Check your puncture site every day for signs of infection. Check for: Redness, swelling, or pain. Fluid or blood. If your puncture site starts to bleed, lie down on your back, apply firm pressure to the area, and contact your health care provider. Warmth. Pus or a bad smell. Driving Do not drive yourself home if you received sedation Do not drive for at least 4 days after your procedure or however long your health care provider recommends. (Do not resume driving if you have previously been instructed not to drive for other health  reasons.) Do not spend greater than 1 hour at a time in a car for the first 3 days. Stop and take a break with a 5 minute walk at least every hour.  Do not drive or use heavy machinery while taking prescription pain medicine.  Activity Avoid activities that take a lot of effort, including exercise, for at least 7 days after your procedure. For the first 3 days, avoid sitting for longer than one hour at a time.  Avoid alcoholic beverages, signing paperwork, or participating in legal proceedings for 24 hours after receiving sedation Do not lift anything that is heavier than 10 lb (4.5 kg) for one week.  No sexual activity for 1 week.  Return to your normal activities as told by your health care provider. Ask your health care provider what activities are safe for you. General instructions Take over-the-counter and prescription medicines only as told by your health care provider. Do not use any products that contain nicotine or tobacco, such as cigarettes and e-cigarettes. If you need help quitting, ask your health care provider. You may shower after 24 hours, but Do not take baths, swim, or use a hot tub for 1 week.  Do not drink alcohol for 24 hours after your procedure. Keep all follow-up visits as told by your health care provider. This is important. Dental Work: You will require antibiotics prior to any dental work, including cleanings, for 6 months after your Watchman implantation to help protect you from infection. After 6 months, antibiotics are no longer required. Contact a health care provider if: You have redness, mild swelling, or pain around your puncture site. You have soreness in your throat or at your puncture site that does not improve after several days You have fluid or blood coming from your puncture site that stops after applying firm pressure to the area. Your puncture site feels warm to the touch. You have pus or a bad smell coming from your puncture site. You have a  fever. You have chest pain or discomfort that spreads to your neck, jaw, or arm. You are sweating a lot. You feel nauseous. You have a fast or irregular heartbeat. You have shortness of breath. You are dizzy or light-headed and feel the need to lie down. You have pain or numbness in the arm or leg closest to your  puncture site. Get help right away if: Your puncture site suddenly swells. Your puncture site is bleeding and the bleeding does not stop after applying firm pressure to the area. These symptoms may represent a serious problem that is an emergency. Do not wait to see if the symptoms will go away. Get medical help right away. Call your local emergency services (911 in the [place] Do not drive yourself to the hospital. Summary After the procedure, it is normal to have bruising and tenderness at the puncture site in your groin, neck, or forearm. Check your puncture site every day for signs of infection. Get help right away if your puncture site is bleeding and the bleeding does not stop after applying firm pressure to the area. This is a medical emergency.  This information is not intended to replace advice given to you by your health care provider. Make sure you discuss any questions you have with your health care provider.   Increase activity slowly   Complete by: As directed        Follow-up Information     Sebastian Lamarr SAUNDERS, PA-C Follow up on 12/01/2023.   Specialties: Cardiology, Radiology Why: Your appointment is at 9:55AM. Please plan to arrive by 9:40AM. Contact information: [address] [place] [address] [phone]                Duration of Discharge Encounter:  APP Time: 25 minutes   Signed, Kate Minus, NP  10/31/2023 10:07 AM

## 2023-10-30 NOTE — Progress Notes (Signed)
 Pt taken off bedrest at 2045. Pt ambulated to bathroom and sitting in chair eating dinner. When assessed at 2130, both groins had blood saturated gauze dressings. Pt was placed back into bed immediately. The left groin was actively bleeding. This RN held pressure for 10 minutes and verified bleeding stopped. Placed new gauze and dressing to both sites and will continue to monitor.

## 2023-10-30 NOTE — Anesthesia Procedure Notes (Signed)
 Procedure Name: Intubation Date/Time: 10/30/2023 3:05 PM  Performed by: Cindie Ronal POUR, CRNAPre-anesthesia Checklist: Patient identified, Emergency Drugs available, Suction available and Patient being monitored Patient Re-evaluated:Patient Re-evaluated prior to induction Oxygen Delivery Method: Circle System Utilized Preoxygenation: Pre-oxygenation with 100% oxygen Induction Type: IV induction Ventilation: Mask ventilation without difficulty Laryngoscope Size: Miller and 2 Grade View: Grade I Tube type: Oral Tube size: 8.0 mm Number of attempts: 1 Airway Equipment and Method: Stylet Placement Confirmation: ETT inserted through vocal cords under direct vision, positive ETCO2 and breath sounds checked- equal and bilateral Secured at: 24 cm Tube secured with: Tape Dental Injury: Teeth and Oropharynx as per pre-operative assessment

## 2023-10-30 NOTE — Anesthesia Preprocedure Evaluation (Signed)
 Anesthesia Evaluation  Patient identified by MRN, date of birth, ID band Patient awake    Reviewed: Allergy & Precautions, NPO status , Patient's Chart, lab work & pertinent test results  History of Anesthesia Complications (+) PONV and history of anesthetic complications  Airway Mallampati: II  TM Distance: >3 FB Neck ROM: Full    Dental  (+) Teeth Intact, Dental Advisory Given   Pulmonary    breath sounds clear to auscultation       Cardiovascular + CAD  + dysrhythmias Atrial Fibrillation  Rhythm:Irregular  1. Left ventricular ejection fraction, by estimation, is 55 to 60%. The  left ventricle has normal function. The left ventricle has no regional  wall motion abnormalities. Left ventricular diastolic parameters are  indeterminate.   2. Right ventricular systolic function is normal. The right ventricular  size is mildly enlarged. Tricuspid regurgitation signal is inadequate for  assessing PA pressure.   3. Left atrial size was severely dilated.   4. Right atrial size was moderately dilated.   5. The mitral valve was not well visualized. Trivial mitral valve  regurgitation. No evidence of mitral stenosis.   6. The aortic valve is tricuspid. There is mild calcification of the  aortic valve. Aortic valve regurgitation is not visualized. No aortic  stenosis is present.   7. The inferior vena cava is dilated in size with <50% respiratory  variability, suggesting right atrial pressure of 15 mmHg.      Neuro/Psych neg Seizures CVA, No Residual Symptoms  negative psych ROS   GI/Hepatic Neg liver ROS,GERD  ,,  Endo/Other   Hyperthyroidism   Renal/GU negative Renal ROSLab Results      Component                Value               Date                      NA                       142                 10/07/2023                K                        4.7                 10/07/2023                CO2                      20                   10/07/2023                GLUCOSE                  82                  10/07/2023                BUN                      16  10/07/2023                CREATININE               0.98                10/07/2023                CALCIUM                   9.3                 10/07/2023                EGFR                     80                  10/07/2023                GFRNONAA                 >60                 06/20/2023                Musculoskeletal   Abdominal   Peds  Hematology Plavix   Lab Results      Component                Value               Date                      WBC                      5.4                 10/07/2023                HGB                      16.2                10/07/2023                HCT                      48.3                10/07/2023                MCV                      96                  10/07/2023                PLT                      192                 10/07/2023              Anesthesia Other Findings   Reproductive/Obstetrics                              Anesthesia Physical Anesthesia  Plan  ASA: 3  Anesthesia Plan: General   Post-op Pain Management: Minimal or no pain anticipated   Induction: Intravenous  PONV Risk Score and Plan: 3 and Dexamethasone , Ondansetron , Propofol  infusion and TIVA  Airway Management Planned: Oral ETT  Additional Equipment: None  Intra-op Plan:   Post-operative Plan: Extubation in OR  Informed Consent: I have reviewed the patients History and Physical, chart, labs and discussed the procedure including the risks, benefits and alternatives for the proposed anesthesia with the patient or authorized representative who has indicated his/her understanding and acceptance.     Dental advisory given  Plan Discussed with: CRNA  Anesthesia Plan Comments:          Anesthesia Quick Evaluation

## 2023-10-30 NOTE — H&P (Signed)
 Electrophysiology Office Follow up Visit Note:     Date:  10/30/2023    ID:  Kyle [name], DOB [birth year], MRN [id number]   PCP:  Nichole Senior, MD      [facility] Cardiologist:  Redell Shallow, MD  [facility] Electrophysiologist:  OLE ONEIDA HOLTS, MD      Interval History:       Kyle [name] is a 78 y.o. male who presents for a follow up visit.  Last saw the patient July 07, 2023 for his atrial fibrillation and history of intracranial hemorrhage.  We discussed using a left atrial appendage occlusion device as a stroke risk mitigation strategy.  He is currently scheduled for watchman implant on October 30, 2023.   Presents for LAAO today. Procedure reviewed.     Objective Past medical, surgical, social and family history were reviewed.   ROS:   Please see the history of present illness.    All other systems reviewed and are negative.   EKGs/Labs/Other Studies Reviewed:     The following studies were reviewed today:               Physical Exam:     VS:  BP 124/67 (BP Location: Left Arm, Patient Position: Sitting)   Pulse 75   Ht 6' 1 (1.854 m)   Wt 211 lb 6.4 oz (95.9 kg)   SpO2 97%   BMI 27.89 kg/m         Wt Readings from Last 3 Encounters:  09/11/23 211 lb 6.4 oz (95.9 kg)  07/07/23 200 lb 6.4 oz (90.9 kg)  06/24/23 197 lb 3.2 oz (89.4 kg)      GEN: no distress CARD: irregularly irregular, No MRG RESP: No IWOB. CTAB.     Assessment ASSESSMENT:     1. Permanent atrial fibrillation (HCC)   2. Nontraumatic subcortical hemorrhage of right cerebral hemisphere [facility])     PLAN:     In order of problems listed above:      #Atrial fibrillation #History of intracranial hemorrhage Discussed stroke risk mitigation strategies with the patient during today's clinic appointment.  We discussed resuming anticoagulation, continuing with antiplatelet therapy and left atrial appendage occlusion.  We discussed the need for short-term anticoagulation  around the time of left atrial appendage occlusion procedure.  I discussed the risks associated with restarting anticoagulation including repeat intracranial hemorrhage.   -----------   I have seen Kyle [name] in the office today who is being considered for a Watchman left atrial appendage closure device. I believe they will benefit from this procedure given their history of atrial fibrillation, CHA2DS2-VASc score of 3 and unadjusted ischemic stroke rate of 3.2% per year. Unfortunately, the patient is not felt to be a long term anticoagulation candidate secondary to intracranial hemorrhage. The patient's chart has been reviewed and I feel that they would be a candidate for short term oral anticoagulation after Watchman implant.    It is my belief that after undergoing a LAA closure procedure, Kyle [name] will not need long term anticoagulation which eliminates anticoagulation side effects and major bleeding risk.    Procedural risks for the Watchman implant have been reviewed with the patient including a 0.5% risk of stroke, <1% risk of perforation and <1% risk of device embolization. Other risks include bleeding, vascular damage, tamponade, worsening renal function, and death. The patient understands these risk and wishes to proceed.       The published clinical data on the safety  and effectiveness of WATCHMAN include but are not limited to the following: - Holmes DR, Jess BEARD, Sick P et al. for the PROTECT AF Investigators. Percutaneous closure of the left atrial appendage versus warfarin therapy for prevention of stroke in patients with atrial fibrillation: a randomised non-inferiority trial. Lancet 2009; 374: 534-42. GLENWOOD Jess BEARD, Doshi SK, Jonita VEAR Satchel D et al. on behalf of the PROTECT AF Investigators. Percutaneous Left Atrial Appendage Closure for Stroke Prophylaxis in Patients With Atrial Fibrillation 2.3-Year Follow-up of the PROTECT AF (Watchman Left Atrial Appendage System for Embolic  Protection in Patients With Atrial Fibrillation) Trial. Circulation 2013; 127:720-729. - Alli O, Doshi S,  Kar S, Reddy VY, Sievert H et al. Quality of Life Assessment in the Randomized PROTECT AF (Percutaneous Closure of the Left Atrial Appendage Versus Warfarin Therapy for Prevention of Stroke in Patients With Atrial Fibrillation) Trial of Patients at Risk for Stroke With Nonvalvular Atrial Fibrillation. J Am Coll Cardiol 2013; 61:1790-8. GLENWOOD Satchel DR, Archer RAMAN, Price M, Whisenant B, Sievert H, Doshi S, Huber K, Reddy V. Prospective randomized evaluation of the Watchman left atrial appendage Device in patients with atrial fibrillation versus long-term warfarin therapy; the PREVAIL trial. Journal of the [employer] of Cardiology, Vol. 4, No. 1, 2014, 1-11. - Kar S, Doshi SK, Sadhu A, Horton R, Osorio J et al. Primary outcome evaluation of a next-generation left atrial appendage closure device: results from the PINNACLE FLX trial. Circulation 2021;143(18)1754-1762.      After today's visit with the patient which was dedicated solely for shared decision making visit regarding LAA closure device, the patient decided to proceed with the LAA appendage closure procedure scheduled to be done in the near future at [facility]. Prior to the procedure, I would like to obtain a gated CT scan of the chest with contrast timed for PV/LA visualization.    HAS-BLED score 2 Hypertension No  Abnormal renal and liver function (Dialysis, transplant, Cr >2.26 mg/dL /Cirrhosis or Bilirubin >2x Normal or AST/ALT/AP >3x Normal) No  Stroke No  Bleeding Yes  Labile INR (Unstable/high INR) No  Elderly (>65) Yes  Drugs or alcohol (>= 8 drinks/week, anti-plt or NSAID) No    CHA2DS2-VASc Score = 3  The patient's score is based upon: CHF History: 0 HTN History: 0 Diabetes History: 0 Stroke History: 0 Vascular Disease History: 1 Age Score: 2 Gender Score: 0   He will start Plavix  1 week prior to the Watchman  procedure and continue his aspirin  uninterrupted.  Will plan to continue DAPT for 6 months following implant.      ----------------  Presents for LAAO today. Procedure reviewed.   Signed, Ole Holts, MD, [facility], [facility] 10/30/2023 Electrophysiology [facility]

## 2023-10-31 ENCOUNTER — Encounter (HOSPITAL_COMMUNITY): Payer: Self-pay | Admitting: Cardiology

## 2023-10-31 DIAGNOSIS — Z95818 Presence of other cardiac implants and grafts: Principal | ICD-10-CM

## 2023-10-31 LAB — BASIC METABOLIC PANEL
Anion gap: 10 (ref 5–15)
BUN: 16 mg/dL (ref 8–23)
CO2: 23 mmol/L (ref 22–32)
Calcium: 8.8 mg/dL — ABNORMAL LOW (ref 8.9–10.3)
Chloride: 106 mmol/L (ref 98–111)
Creatinine, Ser: 0.9 mg/dL (ref 0.61–1.24)
GFR, Estimated: >60 mL/min (ref >60)
Glucose, Bld: 152 mg/dL — ABNORMAL HIGH (ref 70–99)
Potassium: 4.1 mmol/L (ref 3.5–5.1)
Sodium: 139 mmol/L (ref 135–145)

## 2023-10-31 MED FILL — Fentanyl Citrate Soln Prefilled Syringe 100 MCG/2ML: INTRAMUSCULAR | Qty: 2 | Status: AC

## 2023-10-31 NOTE — TOC CM/SW Note (Signed)
 Transition of Care [facility]) - Inpatient Brief Assessment   Patient Details  Name: Kyle [name] MRN: [id number] Date of Birth: May 28, 1946  Transition of Care [facility]) CM/SW Contact:    Sudie Erminio Deems, RN Phone Number: 10/31/2023, 10:29 AM   Clinical Narrative: Patient presented for implantation of WATCHMAN. Plan for transition home today. No home needs identified at this time.    Transition of Care Asessment: Insurance and Status: Insurance coverage has been reviewed Patient has primary care physician: Yes Prior/Current Home Services: No current home services Social Drivers of Health Review: SDOH reviewed no interventions necessary Readmission risk has been reviewed: Yes Transition of care needs: no transition of care needs at this time

## 2023-10-31 NOTE — Discharge Instructions (Signed)
 [facility] Procedure, Care After  Procedure MD: Dr. Isidoro Donning Clinical Coordinator: Karsten Fells, RN  This sheet gives you information about how to care for yourself after your procedure. Your health care provider may also give you more specific instructions. If you have problems or questions, contact your health care provider.  What can I expect after the procedure? After the procedure, it is common to have: Bruising around your puncture site. Tenderness around your puncture site. Tiredness (fatigue).  Medication instructions It is very important to continue to take your blood thinner as directed by your doctor after the Watchman procedure. Call your procedure doctor's office with question or concerns. If you are on Coumadin (warfarin), you will have your INR checked the week after your procedure, with a goal INR of 2.0 - 3.0. Please follow your medication instructions on your discharge summary. Only take the medications listed on your discharge paperwork.  Follow up You will be seen in 1 month after your procedure  You will have a repeat CT scan approximately 8 weeks after your procedure mark to check your device You will follow up the MD/APP who performed your procedure 6 months after your procedure The Watchman Clinical Coordinator will check in with you from time to time, including 1 and 2 years after your procedure.    Follow these instructions at home: Puncture site care  Follow instructions from your health care provider about how to take care of your puncture site. Make sure you: If present, leave stitches (sutures), skin glue, or adhesive strips in place.  If a large square bandage is present, this may be removed 24 hours after surgery.  Check your puncture site every day for signs of infection. Check for: Redness, swelling, or pain. Fluid or blood. If your puncture site starts to bleed, lie down on your back, apply firm pressure to the area, and contact your health  care provider. Warmth. Pus or a bad smell. Driving Do not drive yourself home if you received sedation Do not drive for at least 4 days after your procedure or however long your health care provider recommends. (Do not resume driving if you have previously been instructed not to drive for other health reasons.) Do not spend greater than 1 hour at a time in a car for the first 3 days. Stop and take a break with a 5 minute walk at least every hour.  Do not drive or use heavy machinery while taking prescription pain medicine.  Activity Avoid activities that take a lot of effort, including exercise, for at least 7 days after your procedure. For the first 3 days, avoid sitting for longer than one hour at a time.  Avoid alcoholic beverages, signing paperwork, or participating in legal proceedings for 24 hours after receiving sedation Do not lift anything that is heavier than 10 lb (4.5 kg) for one week.  No sexual activity for 1 week.  Return to your normal activities as told by your health care provider. Ask your health care provider what activities are safe for you. General instructions Take over-the-counter and prescription medicines only as told by your health care provider. Do not use any products that contain nicotine or tobacco, such as cigarettes and e-cigarettes. If you need help quitting, ask your health care provider. You may shower after 24 hours, but Do not take baths, swim, or use a hot tub for 1 week.  Do not drink alcohol for 24 hours after your procedure. Keep all follow-up visits as told by  your health care provider. This is important. Dental Work: You will require antibiotics prior to any dental work, including cleanings, for 6 months after your Watchman implantation to help protect you from infection. After 6 months, antibiotics are no longer required. Contact a health care provider if: You have redness, mild swelling, or pain around your puncture site. You have soreness in  your throat or at your puncture site that does not improve after several days You have fluid or blood coming from your puncture site that stops after applying firm pressure to the area. Your puncture site feels warm to the touch. You have pus or a bad smell coming from your puncture site. You have a fever. You have chest pain or discomfort that spreads to your neck, jaw, or arm. You are sweating a lot. You feel nauseous. You have a fast or irregular heartbeat. You have shortness of breath. You are dizzy or light-headed and feel the need to lie down. You have pain or numbness in the arm or leg closest to your puncture site. Get help right away if: Your puncture site suddenly swells. Your puncture site is bleeding and the bleeding does not stop after applying firm pressure to the area. These symptoms may represent a serious problem that is an emergency. Do not wait to see if the symptoms will go away. Get medical help right away. Call your local emergency services (911 in the [place] Do not drive yourself to the hospital. Summary After the procedure, it is normal to have bruising and tenderness at the puncture site in your groin, neck, or forearm. Check your puncture site every day for signs of infection. Get help right away if your puncture site is bleeding and the bleeding does not stop after applying firm pressure to the area. This is a medical emergency.  This information is not intended to replace advice given to you by your health care provider. Make sure you discuss any questions you have with your health care provider.

## 2023-10-31 NOTE — Progress Notes (Signed)
   EP rounding Note    Patient Name: Kyle [name] Date of Encounter: 10/31/2023  [facility] Cardiologist: Redell Shallow, MD   Subjective   Small amount of bleeding from bilateral femoral access sites overnight.  Bleeding is now stopped.  No hematoma.  Some mild bruising around the sites.  Looks and feels well this morning.  Vital Signs    Vitals:   10/30/23 1840 10/30/23 1953 10/31/23 0010 10/31/23 0544  BP: 111/75 130/82 118/65   Pulse: (!) 50 77 72   Resp: 14 15 19    Temp: 97.9 F (36.6 C) 98 F (36.7 C) 98.4 F (36.9 C) 97.7 F (36.5 C)  TempSrc: Oral Oral Oral Oral  SpO2:  98% 97% 99%  Weight:      Height:        Intake/Output Summary (Last 24 hours) at 10/31/2023 0811 Last data filed at 10/30/2023 2130 Gross per 24 hour  Intake 960 ml  Output --  Net 960 ml      10/30/2023   11:16 AM 10/07/2023    9:02 AM 10/02/2023    3:42 PM  Last 3 Weights  Weight (lbs) 205 lb 203 lb 211 lb  Weight (kg) 92.987 kg 92.08 kg 95.709 kg      Telemetry    Atrial fibrillation-personally Reviewed  ECG    Personally Reviewed  Physical Exam    GEN: No acute distress.   Cardiac: Irregularly irregular, no murmurs, rubs, or gallops.  Femoral venous access sites without hematoma or significant pain.  Small amount of surrounding bruising. Respiratory: Clear to auscultation bilaterally. Psych: Normal affect   Assessment & Plan    #Atrial fibrillation #Watchman device in situ Continue aspirin  81 mg by mouth once daily and Plavix  75 mg by mouth once daily for 6 months after implant.  After 6 months, stop Plavix .  Plan for CT scan 60 days after implant to assess appendage patency and watchman position.   Okay to discharge.      Ole T. Cindie, MD, [facility], [facility] Cardiac Electrophysiology

## 2023-10-31 NOTE — Anesthesia Postprocedure Evaluation (Signed)
 Anesthesia Post Note  Patient: TUNIS GENTLE  Procedure(s) Performed: LEFT ATRIAL APPENDAGE OCCLUSION TRANSESOPHAGEAL ECHOCARDIOGRAM     Patient location during evaluation: PACU Anesthesia Type: General Level of consciousness: awake and alert Pain management: pain level controlled Vital Signs Assessment: post-procedure vital signs reviewed and stable Respiratory status: spontaneous breathing, nonlabored ventilation, respiratory function stable and patient connected to nasal cannula oxygen Cardiovascular status: blood pressure returned to baseline and stable Postop Assessment: no apparent nausea or vomiting Anesthetic complications: no   No notable events documented.  Last Vitals:  Vitals:   10/31/23 0544 10/31/23 0917  BP:  121/74  Pulse:  78  Resp:  16  Temp: 36.5 C 37.2 C  SpO2: 99% 99%    Last Pain:  Vitals:   10/31/23 0917  TempSrc: Oral  PainSc:                  Lynwood MARLA Cornea

## 2023-11-03 ENCOUNTER — Telehealth: Payer: Self-pay

## 2023-11-03 NOTE — Telephone Encounter (Signed)
  [facility]   Watchman Team  Contacted the patient regarding discharge from [facility] on 10/31/2023  The patient understands to follow up with Jeronimo Moors on 12/01/2023  The patient understands discharge instructions? Yes  The patient understands medications and regimen? Yes   The patient reports groin site looks bruised but is otherwise healthy with no S/S of active bleeding or infection.  The patient understands to call with any questions or concerns prior to scheduled visit.

## 2023-11-13 ENCOUNTER — Other Ambulatory Visit: Payer: Self-pay

## 2023-11-13 MED ORDER — CLOPIDOGREL BISULFATE 75 MG PO TABS: 75.0000 mg | ORAL_TABLET | Freq: Every day | ORAL | 3 refills | Status: DC

## 2023-11-28 NOTE — Progress Notes (Signed)
 [facility]   MULTIDISCIPLINARY HEART VALVE CLINIC                                     Cardiology Office Note:    Date:  12/01/2023   ID:  Kyle [name], DOB 24-Jan-1946, MRN [id number]  PCP:  Adrian Prince, MD  [facility] Cardiologist:  Olga Millers, MD  [facility] Electrophysiologist:  Lanier Prude, MD   Referring MD: Adrian Prince, MD   1 month s/p LAAO   History of Present Illness:    Kyle [name] is a 78 y.o. male with a hx of GERD, hyperthyroidism on methimazole, ICH on [facility] and permanent atrial fibrillation s/p LAAO with Watchman on 10/30/23 who presents to clinic for follow up.  Kyle [name] is followed by Dr. Jens Som for his cardiology care. He was diagnosed with atrial fib in 11/2016 and was started on Elquis. He was seen in the ED on 06/11/23 with an acute headache and lethargy. 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 His Eliquis was reversed with Andexxa and he was admitted to the neuro ICU. He subsequently stabilized and was discharged home. He was eventually restarted on aspirin and referred for Watchman. S/p LAAO with a 27mm Watchman FLX Pro device on 10/30/23 by Dr. Lalla Brothers.  He was discharged on ASA and Plavix with plans to continue for 6 months post implant (04/28/24). At that time he will stop Plavix and continue ASA 81mg  daily.   Today the patient presents to clinic for follow up. Here with his wife. No CP or SOB. He has LE edema but no orthopnea or PND. He has Lasix 20mg  PRN from Dr. Evlyn Kanner that was not on his med list here. No dizziness or syncope. No blood in stool or urine. No palpitations. Wonders if he can drive again.    Past Medical History:  Diagnosis Date   Atrial fibrillation (HCC)    History of kidney stones 10-23-2011  SPONTANEOUSLY PASSED   History of migraine    Hyperthyroidism NO MEDS OR TX   MONITORED BY DR SOUTH   Mild acid reflux OCCASIONAL-  WATCHES DIET    PONV (postoperative nausea and vomiting)    Presence of Watchman left atrial appendage closure device 10/31/2023   27mm Watchman FLX Pro placed by Dr. Lalla Brothers   Rosacea      Current Medications: Current Meds  Medication Sig   aspirin EC 81 MG tablet Take 1 tablet (81 mg total) by mouth daily. Swallow whole.   clopidogrel (PLAVIX) 75 MG tablet Take 1 tablet (75 mg total) by mouth daily.   doxycycline (PERIOSTAT) 20 MG tablet Take 20 mg by mouth 2 (two) times daily.   methimazole (TAPAZOLE) 5 MG tablet Take 5 mg by mouth every other day.   rosuvastatin (CRESTOR) 20 MG tablet TAKE 1 TABLET DAILY (SCHEDULE OFFICE VISIT FOR FUTURE REFILLS)   [DISCONTINUED] furosemide (LASIX) 20 MG tablet Take 1 tablet (20 mg total) by mouth daily.      ROS:   Please see the history of present illness.    All other systems reviewed and are negative.  EKGs       Risk Assessment/Calculations:    CHA2DS2-VASc Score = 3   This indicates a 3.2% annual risk of stroke. The patient's score is based upon: CHF History: 0 HTN History: 0 Diabetes  History: 0 Stroke History: 0 Vascular Disease History: 1 Age Score: 2 Gender Score: 0       Physical Exam:    VS:  BP 120/78   Pulse 71   Ht 6\' 1"  (1.854 m)   Wt 211 lb 3.2 oz (95.8 kg)   SpO2 98%   BMI 27.86 kg/m     Wt Readings from Last 3 Encounters:  12/01/23 211 lb 3.2 oz (95.8 kg)  10/30/23 205 lb (93 kg)  10/07/23 203 lb (92.1 kg)     GEN: Well nourished, well developed in no acute distress NECK: No JVD CARDIAC: irreg irerg, no murmurs, rubs, gallops RESPIRATORY:  Clear to auscultation without rales, wheezing or rhonchi  ABDOMEN: Soft, non-tender, non-distended EXTREMITIES:  2+ pitting edema with sock indentations. R>L. No deformity.    ASSESSMENT:    1. Presence of Watchman left atrial appendage closure device   2. Permanent atrial fibrillation (HCC)   3. Nontraumatic intracerebral hemorrhage, unspecified cerebral location,  unspecified laterality (HCC)   4. Coronary artery calcification   5. Lower extremity edema     PLAN:    In order of problems listed above:  Permanent atrial fibrillation s/p LAAO: -- Rate well controlled on no AV nodal blocking agents.  -- Continue on ASA 81mg  daily and Plavix 75mg  daily.  -- Plan to stop Plavix on 04/28/24 (6 month from implant) and continue ASA 81mg  daily.  -- SBE prophylaxis x 6 months discussed. He will defer dental work. -- Set up for CT on 12/26/23 to assess device placement.  -- BMET in two weeks given new Lasix start (see below).   ICH: -- Followed by Dr. Pearlean Brownie. -- Asked if he was cleared to drive. Will defer to neuro.  Elevated CAC score:  -- Pre Watchman CT showed a CAC score of 2444, which is 91st% for age/sex/race matched controls. -- Continue Crestor 20mg  daily.  -- Destination therapy with aspirin 81mg  daily.   LE edema:  -- He has been taking Lasix 20mg  as needed. -- I have recommended that he start Lasix 20mg  daily.  -- BMET in 2 weeks.   Medication Adjustments/Labs and Tests Ordered: Current medicines are reviewed at length with the patient today.  Concerns regarding medicines are outlined above.  Orders Placed This Encounter  Procedures   CT CARDIAC MORPH/PULM VEIN W/CM&W/O CA SCORE   Basic Metabolic Panel (BMET)   Meds ordered this encounter  Medications   DISCONTD: furosemide (LASIX) 20 MG tablet    Sig: Take 1 tablet (20 mg total) by mouth daily.    Dispense:  90 tablet    Refill:  3   furosemide (LASIX) 20 MG tablet    Sig: Take 1 tablet (20 mg total) by mouth daily.    Dispense:  90 tablet    Refill:  3    Patient Instructions  Medication Instructions:  Your physician has recommended you make the following change in your medication:   START Lasix 20 mg taking 1 daily  *If you need a refill on your cardiac medications before your next appointment, please call your pharmacy*   Lab Work: 2 WEEKS, GO TO A [facility] NEAR YOU FOR:   BMET   [facility] locations:   [facility] - [address] ([address] [employer] office) - [address] ([facility]) - [address] [phone]   [place] [facility] [address]  [place]  - [address]    [place] - [address] - [address] [employer]  - [address] - [address] Chief Technology Officer)  If you have labs (blood work) drawn today and your tests are completely normal, you will receive your results only by: [employer] (if you have MyChart) OR A paper copy in the mail If you have any lab test that is abnormal or we need to change your treatment, we will call you to review the results.   Testing/Procedures: None ordered   Follow-Up: At [facility], you and your health needs are our priority.  As part of our continuing mission to provide you with exceptional heart care, we have created designated Provider Care Teams.  These Care Teams include your primary Cardiologist (physician) and Advanced Practice Providers (APPs -  Physician Assistants and Nurse Practitioners) who all work together to provide you with the care you need, when you need it.  We recommend signing up for the patient portal called "MyChart".  Sign up information is provided on this After Visit Summary.  MyChart is used to connect with patients for Virtual Visits (Telemedicine).  Patients are able to view lab/test results, encounter notes, upcoming appointments, etc.  Non-urgent messages can be sent to your provider as well.   To learn more about what you can do with MyChart, go to [email or web address].    Your next appointment:   As scheduled  Provider:   Olga Millers, MD     Other Instructions    1st Floor: - Lobby - Registration  - Pharmacy  - Lab - Cafe  2nd Floor: - PV Lab - Diagnostic Testing (echo, CT, nuclear  med)  3rd Floor: - Vacant  4th Floor: - TCTS (cardiothoracic surgery) - AFib Clinic - Structural Heart Clinic - Vascular Surgery  - Vascular Ultrasound  5th Floor: - HeartCare Cardiology (general and EP) - Clinical Pharmacy for coumadin, hypertension, lipid, weight-loss medications, and med management appointments    Valet parking services will be available as well.         Signed, Cline Crock, PA-C  12/01/2023 10:36 AM    [facility]

## 2023-12-01 ENCOUNTER — Other Ambulatory Visit: Payer: Self-pay | Admitting: *Deleted

## 2023-12-01 ENCOUNTER — Ambulatory Visit: Payer: Medicare Other | Attending: Physician Assistant | Admitting: Physician Assistant

## 2023-12-01 VITALS — BP 120/78 | HR 71 | Ht 73.0 in | Wt 211.2 lb

## 2023-12-01 DIAGNOSIS — I4821 Permanent atrial fibrillation: Secondary | ICD-10-CM | POA: Diagnosis present

## 2023-12-01 DIAGNOSIS — Z95818 Presence of other cardiac implants and grafts: Secondary | ICD-10-CM

## 2023-12-01 DIAGNOSIS — I2510 Atherosclerotic heart disease of native coronary artery without angina pectoris: Secondary | ICD-10-CM | POA: Diagnosis present

## 2023-12-01 DIAGNOSIS — I619 Nontraumatic intracerebral hemorrhage, unspecified: Secondary | ICD-10-CM | POA: Diagnosis not present

## 2023-12-01 DIAGNOSIS — R600 Localized edema: Secondary | ICD-10-CM | POA: Diagnosis not present

## 2023-12-01 MED ORDER — FUROSEMIDE 20 MG PO TABS: 20.0000 mg | ORAL_TABLET | Freq: Every day | ORAL | 3 refills | Status: AC

## 2023-12-01 MED ORDER — FUROSEMIDE 20 MG PO TABS: 20.0000 mg | ORAL_TABLET | Freq: Every day | ORAL | 3 refills | Status: DC

## 2023-12-01 NOTE — Patient Instructions (Signed)
 Medication Instructions:  Your physician has recommended you make the following change in your medication:   START Lasix 20 mg taking 1 daily  *If you need a refill on your cardiac medications before your next appointment, please call your pharmacy*   Lab Work: 2 WEEKS, GO TO A [facility] NEAR YOU FOR:  BMET   [facility] locations:   [facility] - [address] ([address] [place] [employer] office) - [address] ([facility]) - [address] [phone]   [place] [facility] [address]    [place]  - [address]    [facility] - [address] - [address] [employer]  - [address] [place] - [address] Chief Technology Officer)  If you have labs (blood work) drawn today and your tests are completely normal, you will receive your results only by: [employer] (if you have MyChart) OR A paper copy in the mail If you have any lab test that is abnormal or we need to change your treatment, we will call you to review the results.   Testing/Procedures: None ordered   Follow-Up: At [facility], you and your health needs are our priority.  As part of our continuing mission to provide you with exceptional heart care, we have created designated Provider Care Teams.  These Care Teams include your primary Cardiologist (physician) and Advanced Practice Providers (APPs -  Physician Assistants and Nurse Practitioners) who all work together to provide you with the care you need, when you need it.  We recommend signing up for the patient portal called "MyChart".  Sign up information is provided on this After Visit Summary.  MyChart is used to connect with patients for Virtual Visits (Telemedicine).  Patients are able to view lab/test results, encounter notes, upcoming appointments, etc.  Non-urgent messages can be sent to your provider as well.   To learn more  about what you can do with MyChart, go to [email or web address].    Your next appointment:   As scheduled  Provider:   Olga Millers, MD     Other Instructions    1st Floor: - Lobby - Registration  - Pharmacy  - Lab - Cafe  2nd Floor: - PV Lab - Diagnostic Testing (echo, CT, nuclear med)  3rd Floor: - Vacant  4th Floor: - TCTS (cardiothoracic surgery) - AFib Clinic - Structural Heart Clinic - Vascular Surgery  - Vascular Ultrasound  5th Floor: - HeartCare Cardiology (general and EP) - Clinical Pharmacy for coumadin, hypertension, lipid, weight-loss medications, and med management appointments    Valet parking services will be available as well.

## 2023-12-04 ENCOUNTER — Encounter: Payer: Self-pay | Admitting: Neurology

## 2023-12-11 ENCOUNTER — Ambulatory Visit: Payer: Medicare Other | Admitting: Neurology

## 2023-12-18 LAB — BASIC METABOLIC PANEL WITH GFR
BUN/Creatinine Ratio: 18 (ref 10–24)
BUN: 17 mg/dL (ref 8–27)
CO2: 24 mmol/L (ref 20–29)
Calcium: 9.4 mg/dL (ref 8.6–10.2)
Chloride: 107 mmol/L — ABNORMAL HIGH (ref 96–106)
Creatinine, Ser: 0.97 mg/dL (ref 0.76–1.27)
Glucose: 68 mg/dL — ABNORMAL LOW (ref 70–99)
Potassium: 4.5 mmol/L (ref 3.5–5.2)
Sodium: 145 mmol/L — ABNORMAL HIGH (ref 134–144)
eGFR: 81 mL/min/{1.73_m2} (ref >59)

## 2023-12-26 ENCOUNTER — Ambulatory Visit (HOSPITAL_COMMUNITY)
Admission: RE | Admit: 2023-12-26 | Discharge: 2023-12-26 | Disposition: A | Discharge status: Home / Self Care | Source: Ambulatory Visit | Attending: Cardiology | Admitting: Cardiology

## 2023-12-26 DIAGNOSIS — I4821 Permanent atrial fibrillation: Secondary | ICD-10-CM | POA: Diagnosis not present

## 2023-12-26 DIAGNOSIS — I619 Nontraumatic intracerebral hemorrhage, unspecified: Secondary | ICD-10-CM | POA: Insufficient documentation

## 2023-12-26 DIAGNOSIS — Z95818 Presence of other cardiac implants and grafts: Secondary | ICD-10-CM | POA: Diagnosis present

## 2023-12-26 MED ORDER — IOHEXOL 350 MG/ML SOLN
100.0000 mL | Freq: Once | INTRAVENOUS | Status: AC | PRN
  Administered 2023-12-26: 100 mL via INTRAVENOUS

## 2023-12-27 ENCOUNTER — Encounter: Payer: Self-pay | Admitting: Cardiology

## 2024-01-06 NOTE — Progress Notes (Signed)
 HPI: FU atrial fibrillation. Exercise treadmill in 2007 negative. Abdominal CT January 2013 showed coronary artery calcification but no abdominal aortic aneurysm. Holter monitor March 2018 showed atrial fibrillation with PVCs or aberrantly conducted beats with slow rate. Cardizem  was discontinued.  Patient had intracranial hemorrhage September 2024.  Echocardiogram September 2024 showed normal LV function, mild right ventricular enlargement, biatrial enlargement.  CTA October 2024 showed no left atrial appendage thrombus, calcium  score 2444 which was 91st percentile. TEE 2/25 showed normal LV function; severe LAE; no LAA thrombus, moderate RAE; mild MR. Had watchman placed 2/25. FU CT 4/25 showed no thrombus and no leak.  Since he was last seen  Current Outpatient Medications  Medication Sig Dispense Refill   aspirin  EC 81 MG tablet Take 1 tablet (81 mg total) by mouth daily. Swallow whole. 30 tablet 12   clopidogrel  (PLAVIX ) 75 MG tablet TAKE 1 TABLET BY MOUTH EVERY DAY 90 tablet 3   doxycycline  (PERIOSTAT ) 20 MG tablet Take 20 mg by mouth 2 (two) times daily.     furosemide  (LASIX ) 20 MG tablet Take 1 tablet (20 mg total) by mouth daily. 90 tablet 3   methimazole  (TAPAZOLE ) 5 MG tablet Take 5 mg by mouth every other day.     rosuvastatin  (CRESTOR ) 20 MG tablet TAKE 1 TABLET DAILY (SCHEDULE OFFICE VISIT FOR FUTURE REFILLS) 90 tablet 3   No current facility-administered medications for this visit.     Past Medical History:  Diagnosis Date   Atrial fibrillation (HCC)    History of kidney stones 10-23-2011  SPONTANEOUSLY PASSED   History of migraine    Hyperthyroidism NO MEDS OR TX   MONITORED BY DR SOUTH   Mild acid reflux OCCASIONAL-  WATCHES DIET   PONV (postoperative nausea and vomiting)    Presence of Watchman left atrial appendage closure device 10/31/2023   27mm Watchman FLX Pro placed by Dr. Marven Slimmer   Rosacea     Past Surgical History:  Procedure Laterality Date    CARDIOVERSION  10-15-2007  DR Judy Null   NEW ON-SET A-FIB   ELBOW SURGERY     right   ELBOW SURGERY     KNEE ARTHROSCOPY WITH LATERAL MENISECTOMY  08/07/2012   Procedure: KNEE ARTHROSCOPY WITH LATERAL MENISECTOMY;  Surgeon: Verlinda Gloss, MD;  Location: [facility];  Service: Orthopedics;  Laterality: Left;  Left Knee Arthroscopy with Partial Lateral Menisectomy    KNEE SURGERY     LEFT ATRIAL APPENDAGE OCCLUSION N/A 10/30/2023   Procedure: LEFT ATRIAL APPENDAGE OCCLUSION;  Surgeon: Boyce Byes, MD;  Location: [facility] INVASIVE CV LAB;  Service: Cardiovascular;  Laterality: N/A;   RIGHT ELBOW SURGERY  1992 (APPROX)   TENDINITIS   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  09-12-2010  DR Lauralee Poll   LEFT LATERAL WALL   TRANSESOPHAGEAL ECHOCARDIOGRAM (CATH LAB) N/A 10/30/2023   Procedure: TRANSESOPHAGEAL ECHOCARDIOGRAM;  Surgeon: Boyce Byes, MD;  Location: [facility] INVASIVE CV LAB;  Service: Cardiovascular;  Laterality: N/A;   VASECTOMY REVERSAL  1990  (APPROX)    Social History   Socioeconomic History   Marital status: Married    Spouse name: Not on file   Number of children: 4   Years of education: Not on file   Highest education level: Not on file  Occupational History   Occupation: retirement broker    Employer: [employer]  Tobacco Use   Smoking status: Never   Smokeless tobacco: Never  Vaping Use  Vaping status: Never Used  Substance and Sexual Activity   Alcohol use: Yes    Alcohol/week: 3.0 - 4.0 standard drinks of alcohol    Types: 3 - 4 Glasses of wine per week    Comment: occasional   Drug use: No   Sexual activity: Not on file  Other Topics Concern   Not on file  Social History Narrative   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 He does not use cigarettes or recreational drugs. He does use alcohol occasionally.   Social Drivers of Corporate investment banker Strain: Not on file  Food  Insecurity: No Food Insecurity (06/21/2023)   Hunger Vital Sign    Worried About Running Out of Food in the Last Year: Never true    Ran Out of Food in the Last Year: Never true  Transportation Needs: No Transportation Needs (06/21/2023)   PRAPARE - Administrator, Civil Service (Medical): No    Lack of Transportation (Non-Medical): No  Physical Activity: Not on file  Stress: Not on file  Social Connections: Not on file  Intimate Partner Violence: Not At Risk (06/21/2023)   Humiliation, Afraid, Rape, and Kick questionnaire    Fear of Current or Ex-Partner: No    Emotionally Abused: No    Physically Abused: No    Sexually Abused: No    Family History  Problem Relation Age of Onset   Stroke Mother     ROS: no fevers or chills, productive cough, hemoptysis, dysphasia, odynophagia, melena, hematochezia, dysuria, hematuria, rash, seizure activity, orthopnea, PND, pedal edema, claudication. Remaining systems are negative.  Physical Exam: Well-developed well-nourished in no acute distress.  Skin is warm and dry.  HEENT is normal.  Neck is supple.  Chest is clear to auscultation with normal expansion.  Cardiovascular exam is irregular Abdominal exam nontender or distended. No masses palpated. Extremities show no edema. neuro grossly intact   A/P  1 permanent atrial fibrillation-HR controlled on no medications.  Now s/p watchman; DC plavix  04/28/24.  2 coronary calcification-continue ASA and statin.  Patient is not having chest pain.  Given degree of elevation in CA score, will arrange stress nuclear study to screen for ischemia.  3 hyperlipidemia-continue statin.  Check lipids and liver.  4 prior history of palpitations-no recent symptoms.  Alexandria Angel, MD

## 2024-01-08 ENCOUNTER — Other Ambulatory Visit: Payer: Self-pay | Admitting: Cardiology

## 2024-01-12 ENCOUNTER — Other Ambulatory Visit (HOSPITAL_COMMUNITY): Payer: Self-pay

## 2024-01-13 ENCOUNTER — Ambulatory Visit: Payer: Medicare Other | Attending: Cardiology | Admitting: Cardiology

## 2024-01-13 ENCOUNTER — Encounter: Payer: Self-pay | Admitting: Cardiology

## 2024-01-13 VITALS — BP 108/60 | HR 58 | Ht 73.0 in | Wt 213.0 lb

## 2024-01-13 DIAGNOSIS — E7800 Pure hypercholesterolemia, unspecified: Secondary | ICD-10-CM | POA: Diagnosis present

## 2024-01-13 DIAGNOSIS — Z95818 Presence of other cardiac implants and grafts: Secondary | ICD-10-CM | POA: Insufficient documentation

## 2024-01-13 DIAGNOSIS — I4821 Permanent atrial fibrillation: Secondary | ICD-10-CM | POA: Diagnosis present

## 2024-01-13 DIAGNOSIS — I2510 Atherosclerotic heart disease of native coronary artery without angina pectoris: Secondary | ICD-10-CM | POA: Diagnosis present

## 2024-01-13 LAB — HEPATIC FUNCTION PANEL
ALT: 18 IU/L (ref 0–44)
AST: 19 IU/L (ref 0–40)
Albumin: 4.1 g/dL (ref 3.8–4.8)
Alkaline Phosphatase: 73 IU/L (ref 44–121)
Bilirubin Total: 0.8 mg/dL (ref 0.0–1.2)
Bilirubin, Direct: 0.31 mg/dL (ref 0.00–0.40)
Total Protein: 6.1 g/dL (ref 6.0–8.5)

## 2024-01-13 LAB — LIPID PANEL
Chol/HDL Ratio: 2.7 ratio (ref 0.0–5.0)
Cholesterol, Total: 96 mg/dL — ABNORMAL LOW (ref 100–199)
HDL: 35 mg/dL — ABNORMAL LOW (ref >39)
LDL Chol Calc (NIH): 45 mg/dL (ref 0–99)
Triglycerides: 74 mg/dL (ref 0–149)
VLDL Cholesterol Cal: 16 mg/dL (ref 5–40)

## 2024-01-13 NOTE — Patient Instructions (Signed)
 Medication Instructions:   NO CHANGE  *If you need a refill on your cardiac medications before your next appointment, please call your pharmacy*  Lab Work:  Your physician recommends that you GET LAB WORK TODAY  If you have labs (blood work) drawn today and your tests are completely normal, you will receive your results only by: MyChart Message (if you have MyChart) OR A paper copy in the mail If you have any lab test that is abnormal or we need to change your treatment, we will call you to review the results.  Testing/Procedures:  Your physician has requested that you have a lexiscan myoview. For further information please visit [email or web address] Please follow instruction sheet, as given.   Follow-Up: At [facility], you and your health needs are our priority.  As part of our continuing mission to provide you with exceptional heart care, our providers are all part of one team.  This team includes your primary Cardiologist (physician) and Advanced Practice Providers or APPs (Physician Assistants and Nurse Practitioners) who all work together to provide you with the care you need, when you need it.  Your next appointment:   6 month(s)  Provider:   Alexandria Angel, MD           1st Floor: - Lobby - Registration  - Pharmacy  - Lab - Cafe  2nd Floor: - PV Lab - Diagnostic Testing (echo, CT, nuclear med)  3rd Floor: - Vacant  4th Floor: - TCTS (cardiothoracic surgery) - AFib Clinic - Structural Heart Clinic - Vascular Surgery  - Vascular Ultrasound  5th Floor: - HeartCare Cardiology (general and EP) - Clinical Pharmacy for coumadin, hypertension, lipid, weight-loss medications, and med management appointments    Valet parking services will be available as well.

## 2024-01-14 ENCOUNTER — Encounter: Payer: Self-pay | Admitting: *Deleted

## 2024-01-28 ENCOUNTER — Telehealth (HOSPITAL_COMMUNITY): Payer: Self-pay | Admitting: *Deleted

## 2024-01-28 NOTE — Telephone Encounter (Signed)
 Left message on voicemail per DPR in reference to upcoming appointment scheduled on 02/02/2024 at 8:00 with detailed instructions given per Myocardial Perfusion Study Information Sheet for the test. LM to arrive 15 minutes early, and that it is imperative to arrive on time for appointment to keep from having the test rescheduled. If you need to cancel or reschedule your appointment, please call the office within 24 hours of your appointment. Failure to do so may result in a cancellation of your appointment, and a $50 no show fee. Phone number given for call back for any questions.

## 2024-01-29 ENCOUNTER — Other Ambulatory Visit: Payer: Self-pay | Admitting: Cardiology

## 2024-01-29 DIAGNOSIS — I2510 Atherosclerotic heart disease of native coronary artery without angina pectoris: Secondary | ICD-10-CM

## 2024-02-02 ENCOUNTER — Ambulatory Visit (HOSPITAL_COMMUNITY): Attending: Cardiology

## 2024-02-02 DIAGNOSIS — I2510 Atherosclerotic heart disease of native coronary artery without angina pectoris: Secondary | ICD-10-CM | POA: Diagnosis present

## 2024-02-02 MED ORDER — TECHNETIUM TC 99M TETROFOSMIN IV KIT
11.0000 | PACK | Freq: Once | INTRAVENOUS | Status: AC | PRN
  Administered 2024-02-02: 11 via INTRAVENOUS

## 2024-02-02 MED ORDER — REGADENOSON 0.4 MG/5ML IV SOLN
INTRAVENOUS | Status: AC
  Filled 2024-02-02: qty 5

## 2024-02-02 MED ORDER — REGADENOSON 0.4 MG/5ML IV SOLN
0.4000 mg | Freq: Once | INTRAVENOUS | Status: AC
  Administered 2024-02-02: 0.4 mg via INTRAVENOUS

## 2024-02-02 MED ORDER — TECHNETIUM TC 99M TETROFOSMIN IV KIT
31.4000 | PACK | Freq: Once | INTRAVENOUS | Status: AC | PRN
  Administered 2024-02-02: 31.4 via INTRAVENOUS

## 2024-02-03 ENCOUNTER — Ambulatory Visit: Payer: Self-pay | Admitting: Cardiology

## 2024-02-03 LAB — MYOCARDIAL PERFUSION IMAGING
LV dias vol: 134 mL (ref 62–150)
LV sys vol: 51 mL
Nuc Stress EF: 62 %
Peak HR: 80 {beats}/min
Rest HR: 55 {beats}/min
Rest Nuclear Isotope Dose: 11 mCi
SDS: 0
SRS: 0
SSS: 0
ST Depression (mm): 0 mm
Stress Nuclear Isotope Dose: 31.4 mCi
TID: 1.15

## 2024-02-04 ENCOUNTER — Telehealth: Payer: Self-pay | Admitting: Cardiology

## 2024-02-04 NOTE — Telephone Encounter (Signed)
 Spoke with pt wife, they are going to do an autopsy because they are not sure what happened. Aware to call us  if there is anything she needs.

## 2024-02-04 NOTE — Telephone Encounter (Signed)
 Wife Felipa Horsfall) called to let Dr. Audery Blazing know patient passed away yesterday March 01, 2024).

## 2024-02-22 DEATH — deceased

## 2024-04-29 ENCOUNTER — Telehealth: Payer: Self-pay

## 2024-04-29 NOTE — Telephone Encounter (Signed)
 Called the patient, who had LAAO on 10/30/2023 and spoke with his wife at length.  The patient passed away on 2024/02/18. Mrs. Noecker was rightfully upset and tearful over the sudden death of her husband.  She stated she and her family have no faith in the hospital system anymore as he had a stress test 5/12 and was told his heart was getting adequate blood flow. However, he died one day later. The family had an autopsy done and was told the patient died from a blockage that was missed.   Informed Mrs. Zanetti he would be marked as deceased in the chart so no one would call/appointments wouldn't be missed, but she stated to keep the chart active. She also requested all records from [facility]. Reiterated to her that I cannot do that, but will have Medical Records reach out to her to get her what she needs.

## 2024-06-21 ENCOUNTER — Ambulatory Visit: Payer: Medicare Other | Admitting: Neurology
